# Patient Record
Sex: Female | Born: 1935 | Race: White | Hispanic: No | Smoking: Never smoker
Health system: Southern US, Community
[De-identification: ages and names within clinical notes are randomized; demographics above are authoritative.]

## PROBLEM LIST (undated history)

## (undated) DIAGNOSIS — F419 Anxiety disorder, unspecified: Secondary | ICD-10-CM

## (undated) DIAGNOSIS — B029 Zoster without complications: Secondary | ICD-10-CM

## (undated) DIAGNOSIS — D649 Anemia, unspecified: Secondary | ICD-10-CM

## (undated) DIAGNOSIS — K922 Gastrointestinal hemorrhage, unspecified: Secondary | ICD-10-CM

## (undated) DIAGNOSIS — G629 Polyneuropathy, unspecified: Secondary | ICD-10-CM

## (undated) DIAGNOSIS — Z5189 Encounter for other specified aftercare: Secondary | ICD-10-CM

## (undated) DIAGNOSIS — D509 Iron deficiency anemia, unspecified: Secondary | ICD-10-CM

## (undated) DIAGNOSIS — B0229 Other postherpetic nervous system involvement: Secondary | ICD-10-CM

## (undated) DIAGNOSIS — M5030 Other cervical disc degeneration, unspecified cervical region: Secondary | ICD-10-CM

## (undated) DIAGNOSIS — I639 Cerebral infarction, unspecified: Secondary | ICD-10-CM

## (undated) DIAGNOSIS — M1990 Unspecified osteoarthritis, unspecified site: Secondary | ICD-10-CM

## (undated) HISTORY — PX: TONSILLECTOMY: SUR1361

## (undated) HISTORY — PX: BACK SURGERY: SHX140

## (undated) HISTORY — PX: ABDOMINAL HYSTERECTOMY: SHX81

## (undated) HISTORY — PX: APPENDECTOMY: SHX54

---

## 1998-08-01 ENCOUNTER — Other Ambulatory Visit: Admission: RE | Admit: 1998-08-01 | Discharge: 1998-08-01 | Payer: Self-pay | Admitting: Obstetrics and Gynecology

## 1999-04-25 ENCOUNTER — Encounter (INDEPENDENT_AMBULATORY_CARE_PROVIDER_SITE_OTHER): Payer: Self-pay | Admitting: Specialist

## 1999-04-25 ENCOUNTER — Other Ambulatory Visit: Admission: RE | Admit: 1999-04-25 | Discharge: 1999-04-25 | Payer: Self-pay | Admitting: Gastroenterology

## 1999-08-21 ENCOUNTER — Other Ambulatory Visit: Admission: RE | Admit: 1999-08-21 | Discharge: 1999-08-21 | Payer: Self-pay | Admitting: Obstetrics and Gynecology

## 2001-02-01 ENCOUNTER — Encounter: Admission: RE | Admit: 2001-02-01 | Discharge: 2001-02-01 | Payer: Self-pay | Admitting: Pulmonary Disease

## 2001-02-01 ENCOUNTER — Encounter: Payer: Self-pay | Admitting: Pulmonary Disease

## 2001-03-03 ENCOUNTER — Other Ambulatory Visit: Admission: RE | Admit: 2001-03-03 | Discharge: 2001-03-03 | Payer: Self-pay | Admitting: Obstetrics and Gynecology

## 2001-04-27 ENCOUNTER — Encounter: Admission: RE | Admit: 2001-04-27 | Discharge: 2001-05-12 | Payer: Self-pay | Admitting: Neurological Surgery

## 2002-03-03 ENCOUNTER — Other Ambulatory Visit: Admission: RE | Admit: 2002-03-03 | Discharge: 2002-03-03 | Payer: Self-pay | Admitting: Obstetrics and Gynecology

## 2002-03-15 ENCOUNTER — Encounter: Payer: Self-pay | Admitting: Obstetrics and Gynecology

## 2002-03-15 ENCOUNTER — Encounter: Admission: RE | Admit: 2002-03-15 | Discharge: 2002-03-15 | Payer: Self-pay | Admitting: Obstetrics and Gynecology

## 2002-06-16 ENCOUNTER — Encounter: Admission: RE | Admit: 2002-06-16 | Discharge: 2002-09-14 | Payer: Self-pay | Admitting: *Deleted

## 2002-07-26 ENCOUNTER — Encounter: Admission: RE | Admit: 2002-07-26 | Discharge: 2002-07-26 | Payer: Self-pay | Admitting: Sports Medicine

## 2002-07-26 ENCOUNTER — Encounter: Payer: Self-pay | Admitting: Sports Medicine

## 2002-08-15 ENCOUNTER — Encounter: Admission: RE | Admit: 2002-08-15 | Discharge: 2002-08-15 | Payer: Self-pay | Admitting: Sports Medicine

## 2002-08-15 ENCOUNTER — Encounter: Payer: Self-pay | Admitting: Sports Medicine

## 2002-09-01 ENCOUNTER — Encounter: Admission: RE | Admit: 2002-09-01 | Discharge: 2002-09-01 | Payer: Self-pay | Admitting: Sports Medicine

## 2002-09-01 ENCOUNTER — Encounter: Payer: Self-pay | Admitting: Sports Medicine

## 2003-05-26 ENCOUNTER — Encounter: Admission: RE | Admit: 2003-05-26 | Discharge: 2003-05-26 | Payer: Self-pay | Admitting: Rheumatology

## 2003-05-26 ENCOUNTER — Encounter: Payer: Self-pay | Admitting: Rheumatology

## 2003-06-04 ENCOUNTER — Emergency Department (HOSPITAL_COMMUNITY): Admission: EM | Admit: 2003-06-04 | Discharge: 2003-06-05 | Payer: Self-pay | Admitting: Emergency Medicine

## 2003-06-04 ENCOUNTER — Encounter: Payer: Self-pay | Admitting: Emergency Medicine

## 2003-06-09 ENCOUNTER — Encounter: Admission: RE | Admit: 2003-06-09 | Discharge: 2003-06-09 | Payer: Self-pay | Admitting: Rheumatology

## 2003-06-09 ENCOUNTER — Encounter: Payer: Self-pay | Admitting: Rheumatology

## 2003-07-18 ENCOUNTER — Encounter: Admission: RE | Admit: 2003-07-18 | Discharge: 2003-09-28 | Payer: Self-pay | Admitting: Rheumatology

## 2003-09-21 ENCOUNTER — Encounter: Admission: RE | Admit: 2003-09-21 | Discharge: 2003-09-21 | Payer: Self-pay | Admitting: Sports Medicine

## 2003-10-05 ENCOUNTER — Encounter: Admission: RE | Admit: 2003-10-05 | Discharge: 2003-10-05 | Payer: Self-pay | Admitting: Sports Medicine

## 2003-10-19 ENCOUNTER — Encounter: Admission: RE | Admit: 2003-10-19 | Discharge: 2003-10-19 | Payer: Self-pay | Admitting: Sports Medicine

## 2004-05-02 ENCOUNTER — Ambulatory Visit (HOSPITAL_COMMUNITY): Admission: RE | Admit: 2004-05-02 | Discharge: 2004-05-02 | Payer: Self-pay | Admitting: Pulmonary Disease

## 2004-07-22 ENCOUNTER — Ambulatory Visit: Payer: Self-pay | Admitting: Pulmonary Disease

## 2004-08-02 ENCOUNTER — Ambulatory Visit: Payer: Self-pay | Admitting: Gastroenterology

## 2004-08-05 ENCOUNTER — Ambulatory Visit (HOSPITAL_COMMUNITY): Admission: RE | Admit: 2004-08-05 | Discharge: 2004-08-05 | Payer: Self-pay | Admitting: Gastroenterology

## 2004-08-16 ENCOUNTER — Ambulatory Visit: Payer: Self-pay | Admitting: Gastroenterology

## 2004-09-03 ENCOUNTER — Ambulatory Visit: Payer: Self-pay | Admitting: Pulmonary Disease

## 2004-09-17 ENCOUNTER — Ambulatory Visit: Payer: Self-pay | Admitting: Pulmonary Disease

## 2004-10-28 ENCOUNTER — Ambulatory Visit: Payer: Self-pay | Admitting: Pulmonary Disease

## 2004-12-02 ENCOUNTER — Ambulatory Visit: Payer: Self-pay | Admitting: Pulmonary Disease

## 2005-04-07 ENCOUNTER — Ambulatory Visit: Payer: Self-pay | Admitting: Pulmonary Disease

## 2005-07-09 ENCOUNTER — Ambulatory Visit: Payer: Self-pay | Admitting: Pulmonary Disease

## 2005-07-16 ENCOUNTER — Ambulatory Visit: Payer: Self-pay | Admitting: Pulmonary Disease

## 2005-09-17 ENCOUNTER — Ambulatory Visit: Payer: Self-pay | Admitting: Pulmonary Disease

## 2005-11-25 ENCOUNTER — Ambulatory Visit: Payer: Self-pay | Admitting: Gastroenterology

## 2005-11-28 ENCOUNTER — Ambulatory Visit: Payer: Self-pay | Admitting: Cardiovascular Disease

## 2006-02-25 ENCOUNTER — Ambulatory Visit: Payer: Self-pay | Admitting: Pulmonary Disease

## 2006-03-19 ENCOUNTER — Encounter: Admission: RE | Admit: 2006-03-19 | Discharge: 2006-03-19 | Payer: Self-pay | Admitting: Orthopaedic Surgery

## 2006-04-09 ENCOUNTER — Encounter: Admission: RE | Admit: 2006-04-09 | Discharge: 2006-04-09 | Payer: Self-pay | Admitting: Orthopaedic Surgery

## 2006-04-28 ENCOUNTER — Ambulatory Visit: Payer: Self-pay | Admitting: Pulmonary Disease

## 2006-04-29 ENCOUNTER — Ambulatory Visit: Payer: Self-pay | Admitting: Pulmonary Disease

## 2006-06-15 ENCOUNTER — Encounter: Admission: RE | Admit: 2006-06-15 | Discharge: 2006-06-15 | Payer: Self-pay | Admitting: Orthopaedic Surgery

## 2006-07-01 ENCOUNTER — Ambulatory Visit: Payer: Self-pay | Admitting: Pulmonary Disease

## 2006-07-28 ENCOUNTER — Other Ambulatory Visit: Admission: RE | Admit: 2006-07-28 | Discharge: 2006-07-28 | Payer: Self-pay | Admitting: Radiology

## 2006-10-02 ENCOUNTER — Ambulatory Visit: Payer: Self-pay | Admitting: Pulmonary Disease

## 2006-12-29 ENCOUNTER — Ambulatory Visit: Payer: Self-pay | Admitting: Pulmonary Disease

## 2007-02-01 ENCOUNTER — Ambulatory Visit: Payer: Self-pay | Admitting: Pulmonary Disease

## 2007-03-27 ENCOUNTER — Encounter: Admission: RE | Admit: 2007-03-27 | Discharge: 2007-03-27 | Payer: Self-pay | Admitting: Orthopaedic Surgery

## 2007-04-26 ENCOUNTER — Encounter: Admission: RE | Admit: 2007-04-26 | Discharge: 2007-04-26 | Payer: Self-pay | Admitting: Orthopaedic Surgery

## 2007-05-05 ENCOUNTER — Ambulatory Visit: Payer: Self-pay | Admitting: Pulmonary Disease

## 2007-05-05 LAB — CONVERTED CEMR LAB
Albumin: 3.2 g/dL — ABNORMAL LOW (ref 3.5–5.2)
Alkaline Phosphatase: 71 units/L (ref 39–117)
BUN: 14 mg/dL (ref 6–23)
Basophils Absolute: 0.1 10*3/uL (ref 0.0–0.1)
Creatinine, Ser: 0.5 mg/dL (ref 0.4–1.2)
Eosinophils Absolute: 0.2 10*3/uL (ref 0.0–0.6)
GFR calc Af Amer: 154 mL/min
GFR calc non Af Amer: 127 mL/min
Hemoglobin: 12.9 g/dL (ref 12.0–15.0)
MCHC: 35.5 g/dL (ref 30.0–36.0)
MCV: 90.6 fL (ref 78.0–100.0)
Monocytes Absolute: 0.9 10*3/uL — ABNORMAL HIGH (ref 0.2–0.7)
Monocytes Relative: 8.5 % (ref 3.0–11.0)
Neutro Abs: 8 10*3/uL — ABNORMAL HIGH (ref 1.4–7.7)
Neutrophils Relative %: 71.7 % (ref 43.0–77.0)
Potassium: 5 meq/L (ref 3.5–5.1)
RDW: 13.4 % (ref 11.5–14.6)
Sodium: 136 meq/L (ref 135–145)
TSH: 4.61 microintl units/mL (ref 0.35–5.50)

## 2007-06-04 ENCOUNTER — Encounter: Admission: RE | Admit: 2007-06-04 | Discharge: 2007-06-04 | Payer: Self-pay | Admitting: Orthopaedic Surgery

## 2007-06-23 ENCOUNTER — Ambulatory Visit: Payer: Self-pay | Admitting: Pulmonary Disease

## 2007-06-28 DIAGNOSIS — M4120 Other idiopathic scoliosis, site unspecified: Secondary | ICD-10-CM | POA: Insufficient documentation

## 2007-06-28 DIAGNOSIS — Z862 Personal history of diseases of the blood and blood-forming organs and certain disorders involving the immune mechanism: Secondary | ICD-10-CM

## 2007-06-28 DIAGNOSIS — F411 Generalized anxiety disorder: Secondary | ICD-10-CM

## 2007-06-28 DIAGNOSIS — M1990 Unspecified osteoarthritis, unspecified site: Secondary | ICD-10-CM | POA: Insufficient documentation

## 2007-06-28 DIAGNOSIS — K449 Diaphragmatic hernia without obstruction or gangrene: Secondary | ICD-10-CM | POA: Insufficient documentation

## 2007-09-01 ENCOUNTER — Ambulatory Visit: Payer: Self-pay | Admitting: Pulmonary Disease

## 2007-09-01 DIAGNOSIS — J42 Unspecified chronic bronchitis: Secondary | ICD-10-CM

## 2007-09-01 DIAGNOSIS — M542 Cervicalgia: Secondary | ICD-10-CM | POA: Insufficient documentation

## 2007-11-22 ENCOUNTER — Telehealth (INDEPENDENT_AMBULATORY_CARE_PROVIDER_SITE_OTHER): Payer: Self-pay | Admitting: *Deleted

## 2007-11-24 ENCOUNTER — Telehealth (INDEPENDENT_AMBULATORY_CARE_PROVIDER_SITE_OTHER): Payer: Self-pay | Admitting: *Deleted

## 2007-12-30 ENCOUNTER — Ambulatory Visit: Payer: Self-pay | Admitting: Internal Medicine

## 2007-12-31 LAB — CONVERTED CEMR LAB
Basophils Absolute: 0 10*3/uL (ref 0.0–0.1)
Basophils Relative: 0 % (ref 0.0–1.0)
CO2: 27 meq/L (ref 19–32)
Calcium: 9.5 mg/dL (ref 8.4–10.5)
Eosinophils Relative: 1.3 % (ref 0.0–5.0)
HCT: 43.3 % (ref 36.0–46.0)
Hemoglobin: 14.7 g/dL (ref 12.0–15.0)
MCV: 93.5 fL (ref 78.0–100.0)
Monocytes Relative: 2 % — ABNORMAL LOW (ref 3.0–12.0)
RBC: 4.63 M/uL (ref 3.87–5.11)
Sodium: 135 meq/L (ref 135–145)
TSH: 3.98 microintl units/mL (ref 0.35–5.50)
WBC: 5.9 10*3/uL (ref 4.5–10.5)

## 2008-01-04 ENCOUNTER — Telehealth: Payer: Self-pay | Admitting: Pulmonary Disease

## 2008-02-10 ENCOUNTER — Ambulatory Visit: Payer: Self-pay | Admitting: Internal Medicine

## 2008-02-23 ENCOUNTER — Ambulatory Visit: Payer: Self-pay | Admitting: Pulmonary Disease

## 2008-02-23 ENCOUNTER — Encounter: Admission: RE | Admit: 2008-02-23 | Discharge: 2008-02-23 | Payer: Self-pay | Admitting: Anesthesiology

## 2008-02-23 LAB — CONVERTED CEMR LAB
OCCULT 1: NEGATIVE
OCCULT 4: NEGATIVE

## 2008-04-12 ENCOUNTER — Ambulatory Visit: Payer: Self-pay | Admitting: Pulmonary Disease

## 2008-04-12 DIAGNOSIS — K5900 Constipation, unspecified: Secondary | ICD-10-CM | POA: Insufficient documentation

## 2008-04-12 DIAGNOSIS — D126 Benign neoplasm of colon, unspecified: Secondary | ICD-10-CM

## 2008-04-17 DIAGNOSIS — H409 Unspecified glaucoma: Secondary | ICD-10-CM | POA: Insufficient documentation

## 2008-05-01 ENCOUNTER — Telehealth (INDEPENDENT_AMBULATORY_CARE_PROVIDER_SITE_OTHER): Payer: Self-pay | Admitting: *Deleted

## 2008-05-12 ENCOUNTER — Telehealth: Payer: Self-pay | Admitting: Pulmonary Disease

## 2008-07-27 ENCOUNTER — Telehealth (INDEPENDENT_AMBULATORY_CARE_PROVIDER_SITE_OTHER): Payer: Self-pay | Admitting: *Deleted

## 2008-07-27 ENCOUNTER — Encounter: Payer: Self-pay | Admitting: Pulmonary Disease

## 2011-01-31 NOTE — Assessment & Plan Note (Signed)
[facility]                               PULMONARY OFFICE NOTE   HILA, BOLDING                     MRN:          [id number]  DATE:04/28/2006                            DOB:          [birth year]    CHIEF COMPLAINT:  Right arm pain.   HISTORY OF PRESENT ILLNESS:  Ms. Jeanne Cruz is a 77 year old white  female, a patient of Dr. Alroy Dust, who while pulling on a hose noticed a  pulled muscle and had some discomfort in her right shoulder and right neck.  She has an appointment to see Dr. Kriste Basque tomorrow but told her that she  should come in and be evaluated at this time.   PAST MEDICAL HISTORY:  Her past medical history is well documented.   MEDICATIONS:  Her medications are well documented on the front sheet.   ALLERGIES:  SHE IS ALLERGIC TO PENICILLIN AND AMPICILLIN.   PHYSICAL EXAMINATION:  GENERAL:  Frail, elderly, white female in no acute  distress at rest.  Of note, she is able to ambulate without problems.  She  articulates her right arm without difficulty and has no obvious deformity or  injury.  HEENT:  Essentially unremarkable.  The right arm has good full range of  motion without pain or discomfort.  She is able to articulate her neck  without pain or discomfort.  Of note, she carries her pocketbook on her  right shoulder which is her affected arm.   IMPRESSION:  Pulled muscle.   PLAN:  She has been instructed to use Tramadol which she already has for  pain and that she can follow up with Dr. Kriste Basque tomorrow.  If she has further  problems, then x-rays and more aggressive evaluation can be done tomorrow.                                   Devra Dopp, MSN, ACNP   SM/MedQ  DD:  04/28/2006  DT:  04/29/2006  Job #:  [id number]

## 2011-01-31 NOTE — Assessment & Plan Note (Signed)
 [facility]                             PULMONARY OFFICE NOTE   Jeanne Cruz, Jeanne Cruz                     MRN:          [id number]  DATE:12/29/2006                            DOB:          Jun 09, 1928    HISTORY OF PRESENT ILLNESS:  The patient is a 77 year old white female  patient of Dr. Jodelle Green who has a known history of asthmatic bronchitis,  hypertension and osteoarthritis who presents with complaint of lower  extremity swelling.  The patient complains over the last few months she  has noticed she has been having increased lower extremity edema that is  worse in the afternoon hours.  She also has noticed that blood pressure  has been elevated with systolic pressures ranging 130-150.  The patient  denies any fever, chest pain, shortness of breath, palpitations, calf  pain, recent travel. The patient does use Celebrex every day for joint  pain but denies any new medications.   PAST MEDICAL HISTORY:  Is reviewed.  CURRENT MEDICATIONS:  Reviewed.   PHYSICAL EXAMINATION:  GENERAL: The patient is a pleasant female in no  acute distress.  VITAL SIGNS:  She is afebrile with stable vital signs.  HEENT:  Unremarkable.  NECK: Supple without cervical adenopathy. No JVD.  CHEST:  Lung sounds reveal diminished breath sounds at the bases,  otherwise clear.  CARDIAC:  Regular rate and rhythm, no murmurs, rubs, or gallops.  ABDOMEN:  Soft, nontender, no palpable hepatosplenomegaly.  EXTREMITIES:  Are warm without any calf tenderness, clubbing. There is  trace to 1+ edema bilaterally. Negative Homan's sign. Pulses are intact.  The patient has arthritic changes bilaterally of the hands.   IMPRESSION AND PLAN:  1. Venous insufficiency with edema. The patient is to begin Lasix 20      mg daily over the next 3 days and then on a p.r.n. basis. Patient      is to keep lower extremities elevated. The patient will be unable      to use support stockings due to  severe arthritis. Patient is to use      a low sodium diet.  Patient will follow back up in 2-3 months with      Dr. Kriste Cruz or sooner if needed.  2. The patient does complain of persistent insomnia. She has been      using Xanax, however without much relief, only averaging about 3-5      hours of sleep. The patient was advised on a      proper sleep hygiene regimen.  She may use Ambien 10 mg at h.s.      p.r.n.  The patient is to decrease stimulating activities such as      TV before bed.      Rubye Oaks, NP  Electronically Signed      Jeanne Cloud. Kriste Basque, MD  Electronically Signed   TP/MedQ  DD: 12/29/2006  DT: 12/29/2006  Job #: [id number]

## 2017-04-16 ENCOUNTER — Emergency Department (HOSPITAL_BASED_OUTPATIENT_CLINIC_OR_DEPARTMENT_OTHER): Payer: Medicare Other

## 2017-04-16 ENCOUNTER — Encounter (HOSPITAL_BASED_OUTPATIENT_CLINIC_OR_DEPARTMENT_OTHER): Payer: Self-pay | Admitting: *Deleted

## 2017-04-16 ENCOUNTER — Observation Stay (HOSPITAL_BASED_OUTPATIENT_CLINIC_OR_DEPARTMENT_OTHER)
Admission: EM | Admit: 2017-04-16 | Discharge: 2017-04-17 | Disposition: A | Discharge status: Home / Self Care | Payer: Medicare Other | Attending: Internal Medicine | Admitting: Internal Medicine

## 2017-04-16 DIAGNOSIS — K439 Ventral hernia without obstruction or gangrene: Secondary | ICD-10-CM | POA: Diagnosis not present

## 2017-04-16 DIAGNOSIS — K449 Diaphragmatic hernia without obstruction or gangrene: Secondary | ICD-10-CM | POA: Diagnosis not present

## 2017-04-16 DIAGNOSIS — F419 Anxiety disorder, unspecified: Secondary | ICD-10-CM | POA: Insufficient documentation

## 2017-04-16 DIAGNOSIS — Z86010 Personal history of colonic polyps: Secondary | ICD-10-CM | POA: Diagnosis not present

## 2017-04-16 DIAGNOSIS — B0229 Other postherpetic nervous system involvement: Secondary | ICD-10-CM | POA: Diagnosis not present

## 2017-04-16 DIAGNOSIS — Z880 Allergy status to penicillin: Secondary | ICD-10-CM | POA: Insufficient documentation

## 2017-04-16 DIAGNOSIS — F411 Generalized anxiety disorder: Secondary | ICD-10-CM | POA: Diagnosis present

## 2017-04-16 DIAGNOSIS — K922 Gastrointestinal hemorrhage, unspecified: Secondary | ICD-10-CM | POA: Diagnosis present

## 2017-04-16 DIAGNOSIS — Z79899 Other long term (current) drug therapy: Secondary | ICD-10-CM | POA: Insufficient documentation

## 2017-04-16 DIAGNOSIS — D509 Iron deficiency anemia, unspecified: Secondary | ICD-10-CM | POA: Diagnosis present

## 2017-04-16 DIAGNOSIS — R531 Weakness: Secondary | ICD-10-CM | POA: Insufficient documentation

## 2017-04-16 DIAGNOSIS — K3189 Other diseases of stomach and duodenum: Secondary | ICD-10-CM | POA: Diagnosis not present

## 2017-04-16 DIAGNOSIS — D62 Acute posthemorrhagic anemia: Secondary | ICD-10-CM | POA: Insufficient documentation

## 2017-04-16 DIAGNOSIS — K921 Melena: Secondary | ICD-10-CM | POA: Insufficient documentation

## 2017-04-16 DIAGNOSIS — E079 Disorder of thyroid, unspecified: Secondary | ICD-10-CM | POA: Diagnosis not present

## 2017-04-16 HISTORY — DX: Unspecified osteoarthritis, unspecified site: M19.90

## 2017-04-16 HISTORY — DX: Anxiety disorder, unspecified: F41.9

## 2017-04-16 HISTORY — DX: Polyneuropathy, unspecified: G62.9

## 2017-04-16 HISTORY — DX: Gastrointestinal hemorrhage, unspecified: K92.2

## 2017-04-16 HISTORY — DX: Other cervical disc degeneration, unspecified cervical region: M50.30

## 2017-04-16 HISTORY — DX: Iron deficiency anemia, unspecified: D50.9

## 2017-04-16 HISTORY — DX: Zoster without complications: B02.9

## 2017-04-16 HISTORY — DX: Other postherpetic nervous system involvement: B02.29

## 2017-04-16 HISTORY — DX: Anemia, unspecified: D64.9

## 2017-04-16 LAB — CBC WITH DIFFERENTIAL/PLATELET
Basophils Absolute: 0 10*3/uL (ref 0.0–0.1)
Basophils Relative: 1 %
Eosinophils Absolute: 0.1 10*3/uL (ref 0.0–0.7)
Eosinophils Relative: 2 %
HCT: 28.2 % — ABNORMAL LOW (ref 36.0–46.0)
Hemoglobin: 8.9 g/dL — ABNORMAL LOW (ref 12.0–15.0)
Lymphocytes Relative: 25 %
Lymphs Abs: 1.1 10*3/uL (ref 0.7–4.0)
MCH: 26.5 pg (ref 26.0–34.0)
MCHC: 31.6 g/dL (ref 30.0–36.0)
MCV: 83.9 fL (ref 78.0–100.0)
Monocytes Absolute: 0.5 10*3/uL (ref 0.1–1.0)
Monocytes Relative: 11 %
Neutro Abs: 2.7 10*3/uL (ref 1.7–7.7)
Neutrophils Relative %: 61 %
Platelets: 240 10*3/uL (ref 150–400)
RBC: 3.36 MIL/uL — ABNORMAL LOW (ref 3.87–5.11)
RDW: 14.9 % (ref 11.5–15.5)
WBC: 4.4 10*3/uL (ref 4.0–10.5)

## 2017-04-16 LAB — URINALYSIS, ROUTINE W REFLEX MICROSCOPIC
Bilirubin Urine: NEGATIVE
Glucose, UA: NEGATIVE mg/dL
Hgb urine dipstick: NEGATIVE
Ketones, ur: NEGATIVE mg/dL
Leukocytes, UA: NEGATIVE
Nitrite: NEGATIVE
Protein, ur: NEGATIVE mg/dL
Specific Gravity, Urine: 1.009 (ref 1.005–1.030)
pH: 7.5 (ref 5.0–8.0)

## 2017-04-16 LAB — BASIC METABOLIC PANEL
Anion gap: 6 (ref 5–15)
BUN: 15 mg/dL (ref 6–20)
CO2: 31 mmol/L (ref 22–32)
Calcium: 8.9 mg/dL (ref 8.9–10.3)
Chloride: 94 mmol/L — ABNORMAL LOW (ref 101–111)
Creatinine, Ser: 0.47 mg/dL (ref 0.44–1.00)
GFR calc Af Amer: >60 mL/min (ref >60)
GFR calc non Af Amer: >60 mL/min (ref >60)
Glucose, Bld: 95 mg/dL (ref 65–99)
Potassium: 4.6 mmol/L (ref 3.5–5.1)
Sodium: 131 mmol/L — ABNORMAL LOW (ref 135–145)

## 2017-04-16 LAB — BRAIN NATRIURETIC PEPTIDE: B Natriuretic Peptide: 59.1 pg/mL (ref 0.0–100.0)

## 2017-04-16 LAB — OCCULT BLOOD X 1 CARD TO LAB, STOOL: Fecal Occult Bld: POSITIVE — AB

## 2017-04-16 MED ORDER — GABAPENTIN 100 MG PO CAPS
100.0000 mg | ORAL_CAPSULE | Freq: Once | ORAL | Status: AC
  Administered 2017-04-16: 100 mg via ORAL
  Filled 2017-04-16: qty 1

## 2017-04-16 MED ORDER — FAMOTIDINE IN NACL 20-0.9 MG/50ML-% IV SOLN
20.0000 mg | Freq: Once | INTRAVENOUS | Status: AC
  Administered 2017-04-16: 20 mg via INTRAVENOUS
  Filled 2017-04-16: qty 50

## 2017-04-16 MED ORDER — PANTOPRAZOLE SODIUM 40 MG IV SOLR
40.0000 mg | Freq: Once | INTRAVENOUS | Status: AC
  Administered 2017-04-16: 40 mg via INTRAVENOUS
  Filled 2017-04-16: qty 40

## 2017-04-16 MED ORDER — TRAMADOL HCL 50 MG PO TABS
50.0000 mg | ORAL_TABLET | Freq: Once | ORAL | Status: AC
  Administered 2017-04-16: 50 mg via ORAL
  Filled 2017-04-16: qty 1

## 2017-04-16 NOTE — ED Provider Notes (Signed)
[facility] Provider Note   CSN: [id number] Arrival date & time: 04/16/17  1456     History   Chief Complaint Chief Complaint  Patient presents with  . Dizziness    HPI Jeanne Cruz is a 83 y.o. female.  HPI   83 year old female with generalized weakness. Progressively worsening over the past 2 weeks. Patient has a past history of iron deficiency anemia and GI bleed. She reports previous hospitalizations required transfusion of packed red blood cells and iron infusions. She was getting iron infusions every 3 months, but last was in March and she has subsequently relocated to this area from [place]. She describes dyspnea is worse with activity. Since this morning she has also been having burning epigastric pain. No cough. No unusual swelling. No bright blood blood per rectum  Past Medical History:  Diagnosis Date  . Anemia   . Anxiety   . Arthritis   . Degenerative disc disease, cervical   . Iron deficiency anemia   . Neuropathy   . Postherpetic neuralgia   . Shingles   . Thyroid disease     Patient Active Problem List   Diagnosis Date Noted  . GLAUCOMA 04/17/2008  . COLONIC POLYPS 04/12/2008  . CONSTIPATION 04/12/2008  . BRONCHITIS, RECURRENT 09/01/2007  . NECK AND BACK PAIN 09/01/2007  . ANXIETY 06/28/2007  . HIATAL HERNIA 06/28/2007  . DEGENERATIVE JOINT DISEASE 06/28/2007  . SCOLIOSIS 06/28/2007  . ANEMIA, HX OF 06/28/2007    Past Surgical History:  Procedure Laterality Date  . ABDOMINAL HYSTERECTOMY    . APPENDECTOMY    . BACK SURGERY    . TONSILLECTOMY      OB History    No data available       Home Medications    Prior to Admission medications   Medication Sig Start Date End Date Taking? Authorizing Provider  albuterol (PROVENTIL HFA;VENTOLIN HFA) 108 (90 Base) MCG/ACT inhaler Inhale into the lungs every 6 (six) hours as needed for wheezing or shortness of breath.   Yes [provider]  ALPRAZolam (XANAX) 0.25 MG  tablet Take 0.25 mg by mouth 3 (three) times daily as needed for anxiety.   Yes [provider]  dorzolamide-timolol (COSOPT) 22.3-6.8 MG/ML ophthalmic solution 1 drop 2 (two) times daily.   Yes [provider]  furosemide (LASIX) 20 MG tablet Take 20 mg by mouth.   Yes [provider]  gabapentin (NEURONTIN) 100 MG capsule Take 100 mg by mouth 3 (three) times daily.   Yes [provider]  Polyethylene Glycol 3350 (MIRALAX PO) Take by mouth.   Yes [provider]  traMADol (ULTRAM) 50 MG tablet Take by mouth every 6 (six) hours as needed.   Yes [provider]    Family History No family history on file.  Social History Social History  Substance Use Topics  . Smoking status: Not on file  . Smokeless tobacco: Not on file  . Alcohol use Not on file     Allergies   Ampicillin and Penicillins   Review of Systems Review of Systems  All systems reviewed and negative, other than as noted in HPI.  Physical Exam Updated Vital Signs BP (!) 152/81   Pulse 80   Temp 98.5 F (36.9 C)   Resp 16   Ht 4\' 11"  (1.499 m)   Wt 41.3 kg (91 lb)   SpO2 97%   BMI 18.38 kg/m   Physical Exam  Constitutional: She appears well-developed and well-nourished.  No distress.  HENT:  Head: Normocephalic and atraumatic.  Eyes: Conjunctivae are normal. Right eye exhibits no discharge. Left eye exhibits no discharge.  Neck: Neck supple.  Cardiovascular: Normal rate, regular rhythm and normal heart sounds.  Exam reveals no gallop and no friction rub.   No murmur heard. Pulmonary/Chest: Effort normal and breath sounds normal. No respiratory distress.  Abdominal: Soft. She exhibits no distension. There is no tenderness.  Musculoskeletal: She exhibits no edema or tenderness.  Neurological: She is alert.  Skin: Skin is warm and dry.  Psychiatric: She has a normal mood and affect. Her behavior is normal. Thought content normal.  Nursing note and  vitals reviewed.    ED Treatments / Results  Labs (all labs ordered are listed, but only abnormal results are displayed) Labs Reviewed  CBC WITH DIFFERENTIAL/PLATELET - Abnormal; Notable for the following:       Result Value   RBC 3.36 (*)    Hemoglobin 8.9 (*)    HCT 28.2 (*)    All other components within normal limits  BASIC METABOLIC PANEL - Abnormal; Notable for the following:    Sodium 131 (*)    Chloride 94 (*)    All other components within normal limits  OCCULT BLOOD X 1 CARD TO LAB, STOOL - Abnormal; Notable for the following:    Fecal Occult Bld POSITIVE (*)    All other components within normal limits  CBC - Abnormal; Notable for the following:    RBC 3.14 (*)    Hemoglobin 8.3 (*)    HCT 26.1 (*)    All other components within normal limits  IRON AND TIBC - Abnormal; Notable for the following:    Iron 14 (*)    Saturation Ratios 3 (*)    All other components within normal limits  FERRITIN - Abnormal; Notable for the following:    Ferritin 6 (*)    All other components within normal limits  RETICULOCYTES - Abnormal; Notable for the following:    RBC. 3.14 (*)    All other components within normal limits  CBC - Abnormal; Notable for the following:    WBC 3.5 (*)    RBC 3.44 (*)    Hemoglobin 9.0 (*)    HCT 28.8 (*)    All other components within normal limits  BRAIN NATRIURETIC PEPTIDE  URINALYSIS, ROUTINE W REFLEX MICROSCOPIC  PROTIME-INR  TYPE AND SCREEN  ABO/RH    EKG  EKG Interpretation None       Radiology No results found.  Procedures Procedures (including critical care time)  Medications Ordered in ED Medications  famotidine (PEPCID) IVPB 20 mg premix (not administered)     Initial Impression / Assessment and Plan / ED Course  I have reviewed the triage vital signs and the nursing notes.  Pertinent labs & imaging results that were available during my care of the patient were reviewed by me and considered in my medical decision  making (see chart for details).     89yF with generalized weakness and dyspnea on exertion. Anemic, but not clear what baseline is. Admit.   Final Clinical Impressions(s) / ED Diagnoses   Final diagnoses:  GI bleed  Anemia  New Prescriptions New Prescriptions   No medications on file     Virgel Manifold, MD 05/03/17 1219

## 2017-04-16 NOTE — ED Triage Notes (Signed)
Pt c/o dizziness, SOB and adb " burning" x 2 weeks, HX anemia

## 2017-04-16 NOTE — ED Notes (Addendum)
ED Provider at bedside. Hemocult collected.

## 2017-04-16 NOTE — ED Notes (Signed)
Pt requested to be given her home meds of Tramadol 50mg  po and Gabapentin 100mg  po.  She did not bring them with her. MD notified.  Orders received.

## 2017-04-16 NOTE — Progress Notes (Signed)
83 yo F with hx of IDA and GI bleeding (unclear type) who presents with few weeks progressive fatigue/weakness.  Reports that she recently moved from [place] and hasn't established care here yet.  Was getting iron transfusions and blood transfusions there fairly regularly.  Now comes in describing "dark stools" and progressive dyspnea with exertion over weeks.    BP (!) 158/88 (BP Location: Left Arm)   Pulse 95   Temp 98.5 F (36.9 C)   Resp 20   Ht 4\' 11"  (1.499 m)   Wt 41.3 kg (91 lb)   SpO2 98%   BMI 18.38 kg/m   Na 131, K 4.6, Cr 0.47, BUN 15 only, WBC 4.4, Hgb 8.9 (unknown baseline) Rectal exam with brown stool, FOBT+ Given Pepcid IV and TRH asked to evaluate overnight for GI consultation tomorrow  To med surg, OBS status.

## 2017-04-17 ENCOUNTER — Observation Stay (HOSPITAL_COMMUNITY): Payer: Medicare Other | Admitting: Anesthesiology

## 2017-04-17 ENCOUNTER — Encounter (HOSPITAL_COMMUNITY): Payer: Self-pay | Admitting: Family Medicine

## 2017-04-17 ENCOUNTER — Encounter (HOSPITAL_COMMUNITY)
Admission: EM | Disposition: A | Discharge status: Home / Self Care | Payer: Self-pay | Source: Home / Self Care | Attending: Emergency Medicine

## 2017-04-17 DIAGNOSIS — D62 Acute posthemorrhagic anemia: Secondary | ICD-10-CM | POA: Diagnosis not present

## 2017-04-17 DIAGNOSIS — K3189 Other diseases of stomach and duodenum: Secondary | ICD-10-CM | POA: Diagnosis not present

## 2017-04-17 DIAGNOSIS — F411 Generalized anxiety disorder: Secondary | ICD-10-CM | POA: Diagnosis not present

## 2017-04-17 DIAGNOSIS — R531 Weakness: Secondary | ICD-10-CM | POA: Diagnosis not present

## 2017-04-17 DIAGNOSIS — K921 Melena: Secondary | ICD-10-CM | POA: Diagnosis not present

## 2017-04-17 DIAGNOSIS — D509 Iron deficiency anemia, unspecified: Secondary | ICD-10-CM | POA: Diagnosis not present

## 2017-04-17 DIAGNOSIS — K922 Gastrointestinal hemorrhage, unspecified: Secondary | ICD-10-CM | POA: Diagnosis not present

## 2017-04-17 HISTORY — PX: ESOPHAGOGASTRODUODENOSCOPY: SHX5428

## 2017-04-17 LAB — CBC
HCT: 26.1 % — ABNORMAL LOW (ref 36.0–46.0)
HCT: 28.8 % — ABNORMAL LOW (ref 36.0–46.0)
Hemoglobin: 8.3 g/dL — ABNORMAL LOW (ref 12.0–15.0)
Hemoglobin: 9 g/dL — ABNORMAL LOW (ref 12.0–15.0)
MCH: 26.4 pg (ref 26.0–34.0)
MCH: 26.2 pg (ref 26.0–34.0)
MCHC: 31.8 g/dL (ref 30.0–36.0)
MCHC: 31.3 g/dL (ref 30.0–36.0)
MCV: 83.1 fL (ref 78.0–100.0)
MCV: 83.7 fL (ref 78.0–100.0)
PLATELETS: 218 10*3/uL (ref 150–400)
PLATELETS: 216 10*3/uL (ref 150–400)
RBC: 3.14 MIL/uL — ABNORMAL LOW (ref 3.87–5.11)
RBC: 3.44 MIL/uL — AB (ref 3.87–5.11)
RDW: 15.2 % (ref 11.5–15.5)
RDW: 15.1 % (ref 11.5–15.5)
WBC: 4.1 10*3/uL (ref 4.0–10.5)
WBC: 3.5 10*3/uL — AB (ref 4.0–10.5)

## 2017-04-17 LAB — IRON AND TIBC
Iron: 14 ug/dL — ABNORMAL LOW (ref 28–170)
SATURATION RATIOS: 3 % — AB (ref 10.4–31.8)
TIBC: 448 ug/dL (ref 250–450)
UIBC: 434 ug/dL

## 2017-04-17 LAB — RETICULOCYTES
RBC.: 3.14 MIL/uL — AB (ref 3.87–5.11)
RETIC COUNT ABSOLUTE: 62.8 10*3/uL (ref 19.0–186.0)
Retic Ct Pct: 2 % (ref 0.4–3.1)

## 2017-04-17 LAB — FERRITIN: Ferritin: 6 ng/mL — ABNORMAL LOW (ref 11–307)

## 2017-04-17 LAB — TYPE AND SCREEN
ABO/RH(D): A POS
Antibody Screen: NEGATIVE

## 2017-04-17 LAB — PROTIME-INR
INR: 0.99
PROTHROMBIN TIME: 13.1 s (ref 11.4–15.2)

## 2017-04-17 SURGERY — EGD (ESOPHAGOGASTRODUODENOSCOPY)
Anesthesia: Monitor Anesthesia Care

## 2017-04-17 MED ORDER — SODIUM CHLORIDE 0.9 % IV SOLN
INTRAVENOUS | Status: DC
  Administered 2017-04-17: 75 mL/h via INTRAVENOUS

## 2017-04-17 MED ORDER — GABAPENTIN 100 MG PO CAPS: 200.0000 mg | ORAL_CAPSULE | Freq: Every day | ORAL | Status: DC

## 2017-04-17 MED ORDER — LACTATED RINGERS IV SOLN
INTRAVENOUS | Status: DC
  Administered 2017-04-17: 13:00:00 via INTRAVENOUS

## 2017-04-17 MED ORDER — TRAMADOL HCL 50 MG PO TABS
50.0000 mg | ORAL_TABLET | Freq: Four times a day (QID) | ORAL | Status: DC | PRN
  Administered 2017-04-17: 50 mg via ORAL
  Filled 2017-04-17: qty 1

## 2017-04-17 MED ORDER — ALPRAZOLAM 0.5 MG PO TABS
0.5000 mg | ORAL_TABLET | Freq: Two times a day (BID) | ORAL | Status: DC
  Administered 2017-04-17 (×2): 0.5 mg via ORAL
  Filled 2017-04-17 (×2): qty 1

## 2017-04-17 MED ORDER — ONDANSETRON HCL 4 MG PO TABS: 4.0000 mg | ORAL_TABLET | Freq: Four times a day (QID) | ORAL | Status: DC | PRN

## 2017-04-17 MED ORDER — SODIUM CHLORIDE 0.9 % IV SOLN
510.0000 mg | Freq: Once | INTRAVENOUS | Status: AC
  Administered 2017-04-17: 510 mg via INTRAVENOUS
  Filled 2017-04-17: qty 17

## 2017-04-17 MED ORDER — PANTOPRAZOLE SODIUM 40 MG PO TBEC: 40.0000 mg | DELAYED_RELEASE_TABLET | Freq: Two times a day (BID) | ORAL | 0 refills | Status: AC

## 2017-04-17 MED ORDER — PROPOFOL 10 MG/ML IV BOLUS
INTRAVENOUS | Status: AC
  Filled 2017-04-17: qty 20

## 2017-04-17 MED ORDER — ACETAMINOPHEN 650 MG RE SUPP: 650.0000 mg | Freq: Four times a day (QID) | RECTAL | Status: DC | PRN

## 2017-04-17 MED ORDER — ONDANSETRON HCL 4 MG/2ML IJ SOLN: 4.0000 mg | Freq: Four times a day (QID) | INTRAMUSCULAR | Status: DC | PRN

## 2017-04-17 MED ORDER — NYSTATIN 100000 UNIT/GM EX POWD
Freq: Three times a day (TID) | CUTANEOUS | Status: DC
  Administered 2017-04-17 (×2): 1 g via TOPICAL
  Filled 2017-04-17: qty 15

## 2017-04-17 MED ORDER — POLYETHYLENE GLYCOL 3350 17 G PO PACK
17.0000 g | PACK | Freq: Two times a day (BID) | ORAL | Status: DC
  Filled 2017-04-17: qty 1

## 2017-04-17 MED ORDER — DORZOLAMIDE HCL-TIMOLOL MAL 2-0.5 % OP SOLN
1.0000 [drp] | Freq: Two times a day (BID) | OPHTHALMIC | Status: DC
  Administered 2017-04-17: 1 [drp] via OPHTHALMIC
  Filled 2017-04-17: qty 10

## 2017-04-17 MED ORDER — GABAPENTIN 100 MG PO CAPS
100.0000 mg | ORAL_CAPSULE | Freq: Two times a day (BID) | ORAL | Status: DC
  Administered 2017-04-17: 100 mg via ORAL
  Filled 2017-04-17: qty 1

## 2017-04-17 MED ORDER — PANTOPRAZOLE SODIUM 40 MG PO TBEC: 40.0000 mg | DELAYED_RELEASE_TABLET | Freq: Two times a day (BID) | ORAL | Status: DC

## 2017-04-17 MED ORDER — SODIUM CHLORIDE 0.9 % IV SOLN: INTRAVENOUS | Status: DC

## 2017-04-17 MED ORDER — PROPOFOL 10 MG/ML IV BOLUS
INTRAVENOUS | Status: DC | PRN
  Administered 2017-04-17 (×5): 10 mg via INTRAVENOUS
  Administered 2017-04-17: 30 mg via INTRAVENOUS

## 2017-04-17 MED ORDER — PANTOPRAZOLE SODIUM 40 MG IV SOLR
40.0000 mg | Freq: Two times a day (BID) | INTRAVENOUS | Status: DC
  Administered 2017-04-17 (×2): 40 mg via INTRAVENOUS
  Filled 2017-04-17 (×2): qty 40

## 2017-04-17 MED ORDER — ACETAMINOPHEN 325 MG PO TABS: 650.0000 mg | ORAL_TABLET | Freq: Four times a day (QID) | ORAL | Status: DC | PRN

## 2017-04-17 NOTE — Progress Notes (Signed)
  PROGRESS NOTE  Patient admitted earlier this morning. See H&P. Patient with remote hx of GI bleeding around 2016 while living in [place]. She has recently relocated to [facility] and re-establishing care here. She states that at that time, EGD found a bleeding vein with normal colonoscopy. She had been doing well since. Then about 3 days ago, she noticed dark stools and has been very weak and tired. She denies any anticoagulant or ibuprofen use. She takes baby aspirin daily. FOBT was positive.   Consult to GI placed PPI IV Trend CBC. Hgb currently stable at 8.3, unknown baseline level  NPO for now Feraheme for iron deficiency  Obtain old records from [facility], DO [facility] [email or web address] Password [email or web address] 04/17/2017, 9:35 AM

## 2017-04-17 NOTE — Care Management Obs Status (Signed)
[facility] NOTIFICATION   Patient Details  Name: ALIX LAHMANN MRN: [id number] Date of Birth: Dec 12, 1927   Medicare Observation Status Notification Given:  Yes    Guadalupe Maple, RN 04/17/2017, 10:05 AM

## 2017-04-17 NOTE — Discharge Summary (Signed)
Physician Discharge Summary  Jeanne Cruz TSV:[id number] DOB: 02/16/1928 DOA: 04/16/2017  PCP: [facility]  Admit date: 04/16/2017 Discharge date: 04/17/2017  Admitted From: Home, [facility] Disposition:  Home, [facility]  Recommendations for Outpatient Follow-up:  1. Follow up with PCP in 1 week 2. Follow up with Dr. Benson Norway, GI, in 2 weeks 3. Stop all NSAIDS, aspirin  4. Please obtain CBC in 1 week  5. Would recommend prn iron IV transfusions. She refuses oral iron supplementation stating that she has had terrible constipation from it in the past; had IV iron in [place] for this reason   Discharge Condition: Stable CODE STATUS: Full  Diet recommendation: Heart healthy   Brief/Interim Summary: HPI by Dr. Loleta Books: Jeanne Cruz is a 83 y.o. female with a past medical history significant for ventral hernia, remote GI bleed who presents with fatigue for a few weeks, now worsening and 1 week melena.  The patient moved back to [place] from [place] 6 months ago, and is in the process of reestablishing medical care here. While in [place], she did have a couple episodes where she had to be admitted to the hospital for fatigue, got a blood transfusion and EGD and was told that she had "a vein bleeding in my stomach".  Last episode was ~2016 she thinks.  Now in the last few weeks she has had progressive weakness/fatigue, similar to previous GIBs.  In the last few days her stools have been dark, and her fatigue has gotten severe, so today she came to the ER.  No bright red blood per rectum.  No epigastric pain.  No dizziness, syncope.  Does not use NSAIDs or anticoagulants, takes only baby aspirin.  ED course: -Afebrile, heart rate 95, respirations pulse ox normal, blood pressure 150/88 -Na 131, K 4.6, Cr 0.47, BUN 15 only, WBC 4.4, Hgb 8.9 (unknown baseline) -Rectal exam with brown stool, FOBT+ -Given Pepcid IV and TRH asked to evaluate overnight for GI consultation  tomorrow  Interim: Patient was evaluated by GI and underwent EGD on 8/3 which found non-bleeding erosive gastropathy. Her hgb remained stable. Iron studies showed iron deficiency anemia and was transfused feraheme while in hospital.   Discharge Diagnoses:  Principal Problem:   GI bleed Active Problems:   Anxiety state   Acute blood loss anemia   Melena secondary to erosive gastropathy -Stop aspirin and all NSAIDS -EGD 8/3 -Follow up with GI as outpatient in 2 weeks  -Continue PPI PO BID   Iron deficiency anemia  -IV feraheme given. Recommend IV iron transfusions as outpatient as she has not tolerated PO in the past    Discharge Instructions  Discharge Instructions    Call MD for:  difficulty breathing, headache or visual disturbances    Complete by:  As directed    Call MD for:  extreme fatigue    Complete by:  As directed    Call MD for:  hives    Complete by:  As directed    Call MD for:  persistant dizziness or light-headedness    Complete by:  As directed    Call MD for:  persistant nausea and vomiting    Complete by:  As directed    Call MD for:  severe uncontrolled pain    Complete by:  As directed    Call MD for:  temperature >100.4    Complete by:  As directed    Diet - low sodium heart healthy    Complete by:  As directed    Discharge instructions    Complete by:  As directed    You were cared for by a hospitalist during your hospital stay. If you have any questions about your discharge medications or the care you received while you were in the hospital after you are discharged, you can call the unit and asked to speak with the hospitalist on call if the hospitalist that took care of you is not available. Once you are discharged, your primary care physician will handle any further medical issues. Please note that NO REFILLS for any discharge medications will be authorized once you are discharged, as it is imperative that you return to your primary care physician  (or establish a relationship with a primary care physician if you do not have one) for your aftercare needs so that they can reassess your need for medications and monitor your lab values.   Increase activity slowly    Complete by:  As directed      Allergies as of 04/17/2017      Reactions   Ampicillin Diarrhea, Other (See Comments)   Has patient had a PCN reaction causing immediate rash, facial/tongue/throat swelling, SOB or lightheadedness with hypotension: No Has patient had a PCN reaction causing severe rash involving mucus membranes or skin necrosis: No Has patient had a PCN reaction that required hospitalization: No Has patient had a PCN reaction occurring within the last 10 years: No If all of the above answers are "NO", then may proceed with Cephalosporin use.   Codeine Other (See Comments)   Reaction:  Somnolence    Penicillins Diarrhea, Other (See Comments)   Has patient had a PCN reaction causing immediate rash, facial/tongue/throat swelling, SOB or lightheadedness with hypotension: No Has patient had a PCN reaction causing severe rash involving mucus membranes or skin necrosis: No Has patient had a PCN reaction that required hospitalization: No Has patient had a PCN reaction occurring within the last 10 years: No If all of the above answers are "NO", then may proceed with Cephalosporin use.      Medication List    TAKE these medications   albuterol 108 (90 Base) MCG/ACT inhaler Commonly known as:  PROVENTIL HFA;VENTOLIN HFA Inhale 1-2 puffs into the lungs every 6 (six) hours as needed for wheezing or shortness of breath.   ALPRAZolam 0.5 MG tablet Commonly known as:  XANAX Take 0.5 mg by mouth 2 (two) times daily.   dorzolamide-timolol 22.3-6.8 MG/ML ophthalmic solution Commonly known as:  COSOPT Place 1 drop into both eyes 2 (two) times daily.   furosemide 20 MG tablet Commonly known as:  LASIX Take 20 mg by mouth daily as needed for edema.   multivitamin with  minerals Tabs tablet Take 1 tablet by mouth daily.   NEURONTIN 100 MG capsule Generic drug:  gabapentin Take 100-200 mg by mouth 3 (three) times daily.   nystatin powder Generic drug:  nystatin Apply 2 g topically 2 (two) times daily as needed (for irritation).   pantoprazole 40 MG tablet Commonly known as:  PROTONIX Take 1 tablet (40 mg total) by mouth 2 (two) times daily.   polyethylene glycol packet Commonly known as:  MIRALAX / GLYCOLAX Take 17 g by mouth 2 (two) times daily.   traMADol 50 MG tablet Commonly known as:  ULTRAM Take 50 mg by mouth every 6 (six) hours as needed for moderate pain.   Vitamin D (Ergocalciferol) 50000 units Caps capsule Commonly known as:  DRISDOL Take 50,000 Units  by mouth every 14 (fourteen) days.      Follow-up [facility]. Schedule an appointment as soon as possible for a visit in 1 week(s).   Contact information: [place] [address] [phone]        Carol Ada, MD. Schedule an appointment as soon as possible for a visit in 2 week(s).   Specialty:  Gastroenterology Contact information: [place], [place] [address] [phone]          Allergies  Allergen Reactions  . Ampicillin Diarrhea and Other (See Comments)    Has patient had a PCN reaction causing immediate rash, facial/tongue/throat swelling, SOB or lightheadedness with hypotension: No Has patient had a PCN reaction causing severe rash involving mucus membranes or skin necrosis: No Has patient had a PCN reaction that required hospitalization: No Has patient had a PCN reaction occurring within the last 10 years: No If all of the above answers are "NO", then may proceed with Cephalosporin use.  . Codeine Other (See Comments)    Reaction:  Somnolence   . Penicillins Diarrhea and Other (See Comments)    Has patient had a PCN reaction causing immediate rash, facial/tongue/throat swelling, SOB or  lightheadedness with hypotension: No Has patient had a PCN reaction causing severe rash involving mucus membranes or skin necrosis: No Has patient had a PCN reaction that required hospitalization: No Has patient had a PCN reaction occurring within the last 10 years: No If all of the above answers are "NO", then may proceed with Cephalosporin use.    Consultations:  GI   Procedures/Studies: Dg Chest 2 View  Result Date: 04/16/2017 CLINICAL DATA:  Dizziness and shortness of breath as well as rectal bleeding. History of colonic polyps, anemia, recurrent episodes of bronchitis. EXAM: CHEST  2 VIEW COMPARISON:  Report of a chest x-ray dated June 04, 2003. FINDINGS: The lungs are adequately inflated and clear. The heart is normal in size. The pulmonary vascularity is not engorged. There is tortuosity of the ascending and descending thoracic aorta. There is no pleural effusion. There is moderate dextrocurvature of the spine centered in the lower thoracic region. There is a moderate-sized hiatal hernia. IMPRESSION: There is no pneumonia, CHF, nor other acute cardiopulmonary disease. Moderate-sized hiatal hernia. Electronically Signed   By: David  Martinique M.D.   On: 04/16/2017 17:08    EGD 8/3   Impression:      - Normal esophagus.                           - 11-12 cm hiatal hernia.                           - Non-bleeding erosive gastropathy.                           - Normal examined duodenum.                           - No specimens collected. Recommendation:           - Patient has a contact number available for                            emergencies. The signs and symptoms of potential  delayed complications were discussed with the                            patient. Return to normal activities tomorrow.                            Written discharge instructions were provided to the                            patient.                           - Resume regular  diet.                           - Continue present medications.       - Follow up in the office in 2 weeks.   Discharge Exam: Vitals:   04/17/17 1330 04/17/17 1357  BP: (!) 106/50 134/67  Pulse: 83 79  Resp: 13 16  Temp:  97.7 F (36.5 C)   Vitals:   04/17/17 1242 04/17/17 1326 04/17/17 1330 04/17/17 1357  BP: (!) 158/71 (!) 95/51 (!) 106/50 134/67  Pulse: 77 (!) 50 83 79  Resp: 17 16 13 16   Temp: 97.7 F (36.5 C)   97.7 F (36.5 C)  TempSrc: Oral   Oral  SpO2: 100% 92% 98% 94%  Weight:      Height:        General: Pt is alert, awake, not in acute distress Cardiovascular: RRR, S1/S2 +, no rubs, no gallops Respiratory: CTA bilaterally, no wheezing, no rhonchi Abdominal: Soft, NT, ND, bowel sounds + Extremities: no edema, no cyanosis    The results of significant diagnostics from this hospitalization (including imaging, microbiology, ancillary and laboratory) are listed below for reference.     Microbiology: No results found for this or any previous visit (from the past 240 hour(s)).   Labs: BNP (last 3 results)  Recent Labs  04/16/17 1636  BNP 67.6   Basic Metabolic Panel:  Recent Labs Lab 04/16/17 1636  NA 131*  K 4.6  CL 94*  CO2 31  GLUCOSE 95  BUN 15  CREATININE 0.47  CALCIUM 8.9   Liver Function Tests: No results for input(s): AST, ALT, ALKPHOS, BILITOT, PROT, ALBUMIN in the last 168 hours. No results for input(s): LIPASE, AMYLASE in the last 168 hours. No results for input(s): AMMONIA in the last 168 hours. CBC:  Recent Labs Lab 04/16/17 1636 04/17/17 0538 04/17/17 1017  WBC 4.4 4.1 3.5*  NEUTROABS 2.7  --   --   HGB 8.9* 8.3* 9.0*  HCT 28.2* 26.1* 28.8*  MCV 83.9 83.1 83.7  PLT 240 218 216   Cardiac Enzymes: No results for input(s): CKTOTAL, CKMB, CKMBINDEX, TROPONINI in the last 168 hours. BNP: Invalid input(s): POCBNP CBG: No results for input(s): GLUCAP in the last 168 hours. D-Dimer No results for input(s): DDIMER in  the last 72 hours. Hgb A1c No results for input(s): HGBA1C in the last 72 hours. Lipid Profile No results for input(s): CHOL, HDL, LDLCALC, TRIG, CHOLHDL, LDLDIRECT in the last 72 hours. Thyroid function studies No results for input(s): TSH, T4TOTAL, T3FREE, THYROIDAB in the last 72 hours.  Invalid input(s): FREET3 Anemia work up  [employer]  04/17/17 [place]  6*  TIBC 448  IRON 14*  RETICCTPCT 2.0   Urinalysis    Component Value Date/Time   COLORURINE YELLOW 04/16/2017 1637   APPEARANCEUR CLEAR 04/16/2017 1637   LABSPEC 1.009 04/16/2017 1637   PHURINE 7.5 04/16/2017 1637   GLUCOSEU NEGATIVE 04/16/2017 1637   HGBUR NEGATIVE 04/16/2017 1637   BILIRUBINUR NEGATIVE 04/16/2017 1637   KETONESUR NEGATIVE 04/16/2017 1637   PROTEINUR NEGATIVE 04/16/2017 1637   NITRITE NEGATIVE 04/16/2017 1637   LEUKOCYTESUR NEGATIVE 04/16/2017 1637   Sepsis Labs Invalid input(s): PROCALCITONIN,  WBC,  LACTICIDVEN Microbiology No results found for this or any previous visit (from the past 240 hour(s)).   Time coordinating discharge: 40 minutes  SIGNED:  Dessa Phi, DO [facility] Pager [phone]  If 7PM-7AM, please contact night-coverage [email or web address] Password [email or web address] 04/17/2017, 2:22 PM

## 2017-04-17 NOTE — H&P (Signed)
History and Physical  Patient Name: Jeanne Cruz     BJS:[id number]    DOB: 08/09/1928    DOA: 04/16/2017 PCP: System, Pcp Not In  Patient coming from: [facility]  Chief Complaint: Weakness, fatigue, melena      HPI: Jeanne Cruz is a 83 y.o. female with a past medical history significant for ventral hernia, remote GI bleed who presents with fatigue for a few weeks, now worsening and 1 week melena.  The patient moved back to [place] from [place] 6 months ago, and is in the process of reestablishing medical care here. While in [place], she did have a couple episodes where she had to be admitted to the hospital for fatigue, got a blood transfusion and EGD and was told that she had "a vein bleeding in my stomach".  Last episode was ~2016 she thinks.  Now in the last few weeks she has had progressive weakness/fatigue, similar to previous GIBs.  In the last few days her stools have been dark, and her fatigue has gotten severe, so today she came to the ER.  No bright red blood per rectum.  No epigastric pain.  No dizziness, syncope.  Does not use NSAIDs or anticoagulants, takes only baby aspirin.  ED course: -Afebrile, heart rate 95, respirations pulse ox normal, blood pressure 150/88 -Na 131, K 4.6, Cr 0.47, BUN 15 only, WBC 4.4, Hgb 8.9 (unknown baseline) -Rectal exam with brown stool, FOBT+ -Given Pepcid IV and TRH asked to evaluate overnight for GI consultation tomorrow     ROS: Review of Systems  Constitutional: Positive for malaise/fatigue.  Gastrointestinal: Positive for melena. Negative for abdominal pain, blood in stool, constipation, diarrhea, nausea and vomiting.  All other systems reviewed and are negative.         Past Medical History:  Diagnosis Date  . Anemia   . Anxiety   . Arthritis   . Degenerative disc disease, cervical   . GI bleeding   . Iron deficiency anemia   . Neuropathy   . Postherpetic neuralgia   . Shingles   . Thyroid disease     Past  Surgical History:  Procedure Laterality Date  . ABDOMINAL HYSTERECTOMY    . APPENDECTOMY    . BACK SURGERY    . TONSILLECTOMY      Social History: Patient lives at [facility].  She is from [place] originally, had moved to [place] 10 years ago to live with her daughter, but just moved back because her son in law died suddenly and now they are moving back to [place] and her daughter is still arranging her deceased husband's affairs back home.  The patient walks with a walker.  Nonsmoker.  Allergies  Allergen Reactions  . Ampicillin Diarrhea and Other (See Comments)    Has patient had a PCN reaction causing immediate rash, facial/tongue/throat swelling, SOB or lightheadedness with hypotension: No Has patient had a PCN reaction causing severe rash involving mucus membranes or skin necrosis: No Has patient had a PCN reaction that required hospitalization: No Has patient had a PCN reaction occurring within the last 10 years: No If all of the above answers are "NO", then may proceed with Cephalosporin use.  . Codeine Other (See Comments)    Reaction:  Somnolence   . Penicillins Diarrhea and Other (See Comments)    Has patient had a PCN reaction causing immediate rash, facial/tongue/throat swelling, SOB or lightheadedness with hypotension: No Has patient had a PCN reaction causing severe rash  involving mucus membranes or skin necrosis: No Has patient had a PCN reaction that required hospitalization: No Has patient had a PCN reaction occurring within the last 10 years: No If all of the above answers are "NO", then may proceed with Cephalosporin use.    Family history: family history includes Colon cancer in her sister; Heart attack in her sister.  Prior to Admission medications   Medication Sig Start Date End Date Taking? Authorizing Provider  albuterol (PROVENTIL HFA;VENTOLIN HFA) 108 (90 Base) MCG/ACT inhaler Inhale 1-2 puffs into the lungs every 6 (six) hours as needed for wheezing or  shortness of breath.    Yes [provider]  ALPRAZolam Duanne Moron) 0.5 MG tablet Take 0.5 mg by mouth 2 (two) times daily.   Yes [provider]  dorzolamide-timolol (COSOPT) 22.3-6.8 MG/ML ophthalmic solution Place 1 drop into both eyes 2 (two) times daily.    Yes [provider]  furosemide (LASIX) 20 MG tablet Take 20 mg by mouth daily as needed for edema.    Yes [provider]  gabapentin (NEURONTIN) 100 MG capsule Take 100-200 mg by mouth 3 (three) times daily.    Yes [provider]  Multiple Vitamin (MULTIVITAMIN WITH MINERALS) TABS tablet Take 1 tablet by mouth daily.   Yes [provider]  nystatin (NYSTATIN) powder Apply 2 g topically 2 (two) times daily as needed (for irritation).   Yes [provider]  polyethylene glycol (MIRALAX / GLYCOLAX) packet Take 17 g by mouth 2 (two) times daily.   Yes [provider]  traMADol (ULTRAM) 50 MG tablet Take 50 mg by mouth every 6 (six) hours as needed for moderate pain.    Yes [provider]  Vitamin D, Ergocalciferol, (DRISDOL) 50000 units CAPS capsule Take 50,000 Units by mouth every 14 (fourteen) days.   Yes [provider]       Physical Exam: BP (!) 151/66 (BP Location: Right Arm)   Pulse 80   Temp 98 F (36.7 C) (Oral)   Resp 15   Ht 4\' 11"  (1.499 m)   Wt 40.8 kg (90 lb)   SpO2 97%   BMI 18.18 kg/m  General appearance: Thin frail adult female, alert and in no acute distress.   Eyes: Anicteric, conjunctiva pink, lids and lashes normal. PERRL.    ENT: No nasal deformity, discharge, epistaxis.  Hearing normal. OP moist without lesions.   Neck: No neck masses.  Trachea midline.  No thyromegaly/tenderness. Skin: Warm and dry.  No jaundice.  No suspicious rashes or lesions. Cardiac: RRR, nl S1-S2, no murmurs appreciated.  Capillary refill is brisk.  JVP not visible.  2+ pitting LE edema at shins.  Radial pulses 2+ and symmetric. Respiratory:  Normal respiratory rate and rhythm.  CTAB without rales or wheezes. Abdomen: Abdomen soft.  No TTP. There is a large ventral hernia of some kind, can't tell if it is umbilical, she reports no surgeries there. MSK: No effusions. Old fairly severe deformites of hands from arthritis. No cyanosis or clubbing. Neuro: Cranial nerves 3-12 intact.  Sensation intact to light touch. Speech is fluent.  Muscle strength 5-/5 but symmetric.    Psych: Sensorium intact and responding to questions, attention normal.  Behavior appropriate.  Affect normal.  Judgment and insight appear normal.     Labs on Admission:  I have personally reviewed following labs and imaging studies: CBC:  Recent Labs Lab 04/16/17 1636  WBC 4.4  NEUTROABS 2.7  HGB 8.9*  HCT  28.2*  MCV 83.9  PLT 371   Basic Metabolic Panel:  Recent Labs Lab 04/16/17 1636  NA 131*  K 4.6  CL 94*  CO2 31  GLUCOSE 95  BUN 15  CREATININE 0.47  CALCIUM 8.9   GFR: Estimated Creatinine Clearance: 31.9 mL/min (by C-G formula based on SCr of 0.47 mg/dL).       Radiological Exams on Admission: Personally reviewed CXR clear, hiatal hernia present: Dg Chest 2 View  Result Date: 04/16/2017 CLINICAL DATA:  Dizziness and shortness of breath as well as rectal bleeding. History of colonic polyps, anemia, recurrent episodes of bronchitis. EXAM: CHEST  2 VIEW COMPARISON:  Report of a chest x-ray dated June 04, 2003. FINDINGS: The lungs are adequately inflated and clear. The heart is normal in size. The pulmonary vascularity is not engorged. There is tortuosity of the ascending and descending thoracic aorta. There is no pleural effusion. There is moderate dextrocurvature of the spine centered in the lower thoracic region. There is a moderate-sized hiatal hernia. IMPRESSION: There is no pneumonia, CHF, nor other acute cardiopulmonary disease. Moderate-sized hiatal hernia. Electronically Signed   By: David  Martinique M.D.   On: 04/16/2017 17:08     EKG: Independently reviewed. Rate 80, QTc 427, no ST changes.        Assessment/Plan  1. GI bleeding:  The story is one of melena and previous UGIB.   -PPI IV -Consult to GI -NPO and MIVF -Obtain old records from [place]   2. Anemia: Presumed acute blood loss anemia, unknown baseline.  Defer transfusion for now. -Repeat CBC this AM to trend -Monitor for signs of blood loss -Check iron studies  3. Other medications:  -Continue gabapentin -Continue tramadol  4. Anxiety:  -Continue Xanax             DVT prophylaxis: SCDs  Code Status: FULL  Family Communication: None present  Disposition Plan: Anticipate IV PPI, GI consult, and NPO until GI eval. Consults called: None overnight Admission status: OBS At the point of initial evaluation, it is my clinical opinion that admission for OBSERVATION is reasonable and necessary because the patient's presenting complaints in the context of their chronic conditions represent sufficient risk of deterioration or significant morbidity to constitute reasonable grounds for close observation in the hospital setting, but that the patient may be medically stable for discharge from the hospital within 24 to 48 hours.    Medical decision making: Patient seen at 2:00 AM on 04/17/2017.  The patient was discussed with Dr. Wilson Singer.  What exists of the patient's chart was reviewed in depth and summarized above.  Clinical condition: stable hemodynamically, no reported blood loss in ER or since transport.        Edwin Dada [facility] Pager [phone]

## 2017-04-17 NOTE — Progress Notes (Signed)
Discharge instructions discussed with patient and caregiver,verbalized agreement and understanding

## 2017-04-17 NOTE — Anesthesia Postprocedure Evaluation (Signed)
Anesthesia Post Note  Patient: Jeanne Cruz  Procedure(s) Performed: Procedure(s) (LRB): ESOPHAGOGASTRODUODENOSCOPY (EGD) (N/A)     Patient location during evaluation: PACU Anesthesia Type: MAC Level of consciousness: awake and alert Pain management: pain level controlled Vital Signs Assessment: post-procedure vital signs reviewed and stable Respiratory status: spontaneous breathing, nonlabored ventilation, respiratory function stable and patient connected to nasal cannula oxygen Cardiovascular status: stable and blood pressure returned to baseline Anesthetic complications: no    Last Vitals:  Vitals:   04/17/17 1330 04/17/17 1357  BP: (!) 106/50 134/67  Pulse: 83 79  Resp: 13 16  Temp:  36.5 C    Last Pain:  Vitals:   04/17/17 1357  TempSrc: Oral  PainSc:                  [name]

## 2017-04-17 NOTE — Progress Notes (Signed)
Pt requesting Nystatin powder for redness beneath bilateral breasts. Reports that this is not the first time that she has had this condition. Reports to having "a brand new bottle of Nystatin powder at home."

## 2017-04-17 NOTE — Discharge Instructions (Signed)
Gastrointestinal Bleeding °Gastrointestinal bleeding is bleeding somewhere along the path food travels through the body (digestive tract). This path is anywhere between the mouth and the opening of the butt (anus). You may have blood in your poop (stools) or have black poop. If you throw up (vomit), there may be blood in it. °This condition can be mild, serious, or even life-threatening. If you have a lot of bleeding, you may need to stay in the hospital. °Follow these instructions at home: °· Take over-the-counter and prescription medicines only as told by your doctor. °· Eat foods that have a lot of fiber in them. These foods include whole grains, fruits, and vegetables. You can also try eating 1-3 prunes each day. °· Drink enough fluid to keep your pee (urine) clear or pale yellow. °· Keep all follow-up visits as told by your doctor. This is important. °Contact a doctor if: °· Your symptoms do not get better. °Get help right away if: °· Your bleeding gets worse. °· You feel dizzy or you pass out (faint). °· You feel weak. °· You have very bad cramps in your back or belly (abdomen). °· You pass large clumps of blood (clots) in your poop. °· Your symptoms are getting worse. °This information is not intended to replace advice given to you by your health care provider. Make sure you discuss any questions you have with your health care provider. °Document Released: 06/10/2008 Document Revised: 02/07/2016 Document Reviewed: 02/19/2015 °Elsevier Interactive Patient Education © 2018 [employer] ° °

## 2017-04-17 NOTE — Consult Note (Signed)
Reason for Consult: Melena and anemia Referring Physician: [facility]  Lars Pinks HPI: This is an 83 year old female with a PMH of a GI bleeding in 2016, per her report, ventral abdominal hernia, and neuropathy who is admitted for progressive weakness and melena over the past week.  Over the past two weeks she started to notice some fatigue, but it was a significant problem this week.  It was also associated with melena.  She was evaluated in [place] two years ago with a similar presentation and she was told to avoid NSAIDs.  At that time she was taking Celebrex, but since that time she denies taking any further NSAIDs.  The patient denies any issues with hematochezia, diarrhea, constipation, nausea, vomiting, or abdominal.  Her admission HGB was at 8.9 g/dL, but in 2009 her HGB was at 14.7 g/dL.  Past Medical History:  Diagnosis Date  . Anemia   . Anxiety   . Arthritis   . Degenerative disc disease, cervical   . GI bleeding   . Iron deficiency anemia   . Neuropathy   . Postherpetic neuralgia   . Shingles   . Thyroid disease     Past Surgical History:  Procedure Laterality Date  . ABDOMINAL HYSTERECTOMY    . APPENDECTOMY    . BACK SURGERY    . TONSILLECTOMY      Family History  Problem Relation Age of Onset  . Colon cancer Sister   . Heart attack Sister     Social History:  reports that she has never smoked. She has never used smokeless tobacco. She reports that she does not drink alcohol or use drugs.  Allergies:  Allergies  Allergen Reactions  . Ampicillin Diarrhea and Other (See Comments)    Has patient had a PCN reaction causing immediate rash, facial/tongue/throat swelling, SOB or lightheadedness with hypotension: No Has patient had a PCN reaction causing severe rash involving mucus membranes or skin necrosis: No Has patient had a PCN reaction that required hospitalization: No Has patient had a PCN reaction occurring within the last 10 years: No If all  of the above answers are "NO", then may proceed with Cephalosporin use.  . Codeine Other (See Comments)    Reaction:  Somnolence   . Penicillins Diarrhea and Other (See Comments)    Has patient had a PCN reaction causing immediate rash, facial/tongue/throat swelling, SOB or lightheadedness with hypotension: No Has patient had a PCN reaction causing severe rash involving mucus membranes or skin necrosis: No Has patient had a PCN reaction that required hospitalization: No Has patient had a PCN reaction occurring within the last 10 years: No If all of the above answers are "NO", then may proceed with Cephalosporin use.    Medications:  Scheduled: . ALPRAZolam  0.5 mg Oral BID  . dorzolamide-timolol  1 drop Both Eyes BID  . gabapentin  100 mg Oral BID AC  . gabapentin  200 mg Oral QHS  . nystatin   Topical TID  . pantoprazole (PROTONIX) IV  40 mg Intravenous Q12H  . polyethylene glycol  17 g Oral BID   Continuous: . sodium chloride 75 mL/hr (04/17/17 0801)    Results for orders placed or performed during the hospital encounter of 04/16/17 (from the past 24 hour(s))  CBC with Differential     Status: Abnormal   Collection Time: 04/16/17  4:36 PM  Result Value Ref Range   WBC 4.4 4.0 - 10.5 K/uL   RBC 3.36 (L)  3.87 - 5.11 MIL/uL   Hemoglobin 8.9 (L) 12.0 - 15.0 g/dL   HCT 28.2 (L) 36.0 - 46.0 %   MCV 83.9 78.0 - 100.0 fL   MCH 26.5 26.0 - 34.0 pg   MCHC 31.6 30.0 - 36.0 g/dL   RDW 14.9 11.5 - 15.5 %   Platelets 240 150 - 400 K/uL   Neutrophils Relative % 61 %   Neutro Abs 2.7 1.7 - 7.7 K/uL   Lymphocytes Relative 25 %   Lymphs Abs 1.1 0.7 - 4.0 K/uL   Monocytes Relative 11 %   Monocytes Absolute 0.5 0.1 - 1.0 K/uL   Eosinophils Relative 2 %   Eosinophils Absolute 0.1 0.0 - 0.7 K/uL   Basophils Relative 1 %   Basophils Absolute 0.0 0.0 - 0.1 K/uL  Brain natriuretic peptide     Status: None   Collection Time: 04/16/17  4:36 PM  Result Value Ref Range   B Natriuretic Peptide  59.1 0.0 - 100.0 pg/mL  Basic metabolic panel     Status: Abnormal   Collection Time: 04/16/17  4:36 PM  Result Value Ref Range   Sodium 131 (L) 135 - 145 mmol/L   Potassium 4.6 3.5 - 5.1 mmol/L   Chloride 94 (L) 101 - 111 mmol/L   CO2 31 22 - 32 mmol/L   Glucose, Bld 95 65 - 99 mg/dL   BUN 15 6 - 20 mg/dL   Creatinine, Ser 0.47 0.44 - 1.00 mg/dL   Calcium 8.9 8.9 - 10.3 mg/dL   GFR calc non Af Amer >60 >60 mL/min   GFR calc Af Amer >60 >60 mL/min   Anion gap 6 5 - 15  Urinalysis, Routine w reflex microscopic     Status: None   Collection Time: 04/16/17  4:37 PM  Result Value Ref Range   Color, Urine YELLOW YELLOW   APPearance CLEAR CLEAR   Specific Gravity, Urine 1.009 1.005 - 1.030   pH 7.5 5.0 - 8.0   Glucose, UA NEGATIVE NEGATIVE mg/dL   Hgb urine dipstick NEGATIVE NEGATIVE   Bilirubin Urine NEGATIVE NEGATIVE   Ketones, ur NEGATIVE NEGATIVE mg/dL   Protein, ur NEGATIVE NEGATIVE mg/dL   Nitrite NEGATIVE NEGATIVE   Leukocytes, UA NEGATIVE NEGATIVE  Occult blood card to lab, stool RN will collect     Status: Abnormal   Collection Time: 04/16/17  5:10 PM  Result Value Ref Range   Fecal Occult Bld POSITIVE (A) NEGATIVE  Type and screen [place]     Status: None   Collection Time: 04/17/17  5:38 AM  Result Value Ref Range   ABO/RH(D) A POS    Antibody Screen NEG    Sample Expiration 04/20/2017   CBC     Status: Abnormal   Collection Time: 04/17/17  5:38 AM  Result Value Ref Range   WBC 4.1 4.0 - 10.5 K/uL   RBC 3.14 (L) 3.87 - 5.11 MIL/uL   Hemoglobin 8.3 (L) 12.0 - 15.0 g/dL   HCT 26.1 (L) 36.0 - 46.0 %   MCV 83.1 78.0 - 100.0 fL   MCH 26.4 26.0 - 34.0 pg   MCHC 31.8 30.0 - 36.0 g/dL   RDW 15.2 11.5 - 15.5 %   Platelets 218 150 - 400 K/uL  Iron and TIBC     Status: Abnormal   Collection Time: 04/17/17  5:38 AM  Result Value Ref Range   Iron 14 (L) 28 - 170 ug/dL   TIBC  448 250 - 450 ug/dL   Saturation Ratios 3 (L) 10.4 - 31.8 %   UIBC 434  ug/dL  Ferritin     Status: Abnormal   Collection Time: 04/17/17  5:38 AM  Result Value Ref Range   Ferritin 6 (L) 11 - 307 ng/mL  Reticulocytes     Status: Abnormal   Collection Time: 04/17/17  5:38 AM  Result Value Ref Range   Retic Ct Pct 2.0 0.4 - 3.1 %   RBC. 3.14 (L) 3.87 - 5.11 MIL/uL   Retic Count, Absolute 62.8 19.0 - 186.0 K/uL  ABO/Rh     Status: None   Collection Time: 04/17/17  5:38 AM  Result Value Ref Range   ABO/RH(D) A POS   CBC     Status: Abnormal   Collection Time: 04/17/17 10:17 AM  Result Value Ref Range   WBC 3.5 (L) 4.0 - 10.5 K/uL   RBC 3.44 (L) 3.87 - 5.11 MIL/uL   Hemoglobin 9.0 (L) 12.0 - 15.0 g/dL   HCT 28.8 (L) 36.0 - 46.0 %   MCV 83.7 78.0 - 100.0 fL   MCH 26.2 26.0 - 34.0 pg   MCHC 31.3 30.0 - 36.0 g/dL   RDW 15.1 11.5 - 15.5 %   Platelets 216 150 - 400 K/uL     Dg Chest 2 View  Result Date: 04/16/2017 CLINICAL DATA:  Dizziness and shortness of breath as well as rectal bleeding. History of colonic polyps, anemia, recurrent episodes of bronchitis. EXAM: CHEST  2 VIEW COMPARISON:  Report of a chest x-ray dated June 04, 2003. FINDINGS: The lungs are adequately inflated and clear. The heart is normal in size. The pulmonary vascularity is not engorged. There is tortuosity of the ascending and descending thoracic aorta. There is no pleural effusion. There is moderate dextrocurvature of the spine centered in the lower thoracic region. There is a moderate-sized hiatal hernia. IMPRESSION: There is no pneumonia, CHF, nor other acute cardiopulmonary disease. Moderate-sized hiatal hernia. Electronically Signed   By: David  Martinique M.D.   On: 04/16/2017 17:08    ROS:  As stated above in the HPI otherwise negative.  Blood pressure (!) 147/76, pulse 83, temperature 98.1 F (36.7 C), temperature source Oral, resp. rate 16, height 4\' 11"  (1.499 m), weight 40.8 kg (90 lb), SpO2 90 %.    PE: Gen: NAD, Alert and Oriented HEENT:  [place]/AT, EOMI Neck: Supple, no  LAD Lungs: CTA Bilaterally CV: RRR without M/G/R ABM: Soft, NTND, +BS, large ventral hernia Ext: No C/C/E  Assessment/Plan: 1) Melena. 2) Heme positive stool. 3) Anemia.   Per his history, she may have suffered from NSAID-induced ulcerations.  I am not certain about the source at this time.  AVMs are a significant consideration.  I will perform an EGD for further evaluation.  Plan: 1) EGD today.  Cutberto Winfree D 04/17/2017, 10:53 AM

## 2017-04-17 NOTE — Progress Notes (Signed)
CSW has confirmed pt is from [facility] not [facility]F.  RNCM will assist with dc planning, if needed.  Werner Lean LCSW [phone]

## 2017-04-17 NOTE — Transfer of Care (Signed)
Immediate Anesthesia Transfer of Care Note  Patient: Jeanne Cruz  Procedure(s) Performed: Procedure(s): ESOPHAGOGASTRODUODENOSCOPY (EGD) (N/A)  Patient Location: PACU  Anesthesia Type:MAC  Level of Consciousness: sedated  Airway & Oxygen Therapy: Patient Spontanous Breathing and Patient connected to nasal cannula oxygen  Post-op Assessment: Report given to RN and Post -op Vital signs reviewed and stable  Post vital signs: Reviewed and stable  Last Vitals:  Vitals:   04/17/17 1242 04/17/17 1326  BP: (!) 158/71   Pulse: 77 (!) 50  Resp: 17 16  Temp: 36.5 C     Last Pain:  Vitals:   04/17/17 1310  TempSrc:   PainSc: 0-No pain      Patients Stated Pain Goal: 4 (89/37/34 2876)  Complications: No apparent anesthesia complications

## 2017-04-17 NOTE — Op Note (Signed)
[facility] Patient Name: Jeanne Cruz Procedure Date: 04/17/2017 MRN: [id number] Attending MD: Carol Ada , MD Date of Birth: [birth year] CSN: [id number] Age: 83 Admit Type: Outpatient Procedure:                Upper GI endoscopy Indications:              Iron deficiency anemia, Melena Providers:                Carol Ada, MD, Laverta Baltimore RN, RN, Alan Mulder, Technician Referring MD:              Medicines:                Propofol per Anesthesia Complications:            No immediate complications. Estimated Blood Loss:     Estimated blood loss: none. Procedure:                Pre-Anesthesia Assessment:                           - Prior to the procedure, a History and Physical                            was performed, and patient medications and                            allergies were reviewed. The patient's tolerance of                            previous anesthesia was also reviewed. The risks                            and benefits of the procedure and the sedation                            options and risks were discussed with the patient.                            All questions were answered, and informed consent                            was obtained. Prior Anticoagulants: The patient has                            taken no previous anticoagulant or antiplatelet                            agents. ASA Grade Assessment: II - A patient with                            mild systemic disease. After reviewing the risks  and benefits, the patient was deemed in                            satisfactory condition to undergo the procedure.                           - Sedation was administered by an anesthesia                            professional. Deep sedation was attained.                           After obtaining informed consent, the endoscope was                            passed under direct vision.  Throughout the                            procedure, the patient's blood pressure, pulse, and                            oxygen saturations were monitored continuously. The                            [id number] ([id number]) scope was introduced through the                            mouth, and advanced to the second part of duodenum.                            The upper GI endoscopy was technically difficult                            and complex due to abnormal anatomy. Successful                            completion of the procedure was aided by applying                            abdominal pressure. Scope In: Scope Out: Findings:      The esophagus was normal.      A 11-12 cm hiatal hernia was present.      Multiple localized, non-bleeding erosions were found in the gastric       body. There were no stigmata of recent bleeding. These were Cameron's       lesions.      The examined duodenum was normal.      The endoscopy was very difficult to perform as a result of her ventral       hernia and large hiatal hernia. Abdominal pressure was required to allow       for eventual passage of the endoscope into the duodenum. The Cameron's       lesions are most likely the source of the bleeding. No evidence of AVMs,       inflammation, or masses. Impression:               -  Normal esophagus.                           - 11-12 cm hiatal hernia.                           - Non-bleeding erosive gastropathy.                           - Normal examined duodenum.                           - No specimens collected. Moderate Sedation:      N/A- Per Anesthesia Care Recommendation:           - Patient has a contact number available for                            emergencies. The signs and symptoms of potential                            delayed complications were discussed with the                            patient. Return to normal activities tomorrow.                            Written discharge  instructions were provided to the                            patient.                           - Resume regular diet.                           - Continue present medications.                           - Follow up in the office in 2 weeks. Procedure Code(s):        --- Professional ---                           [phone], Esophagogastroduodenoscopy, flexible,                            transoral; diagnostic, including collection of                            specimen(s) by brushing or washing, when performed                            (separate procedure) Diagnosis Code(s):        --- Professional ---                           K31.89, Other diseases of stomach and duodenum  K44.9, Diaphragmatic hernia without obstruction or                            gangrene                           D50.9, Iron deficiency anemia, unspecified                           K92.1, Melena (includes Hematochezia) CPT copyright 2016 American [employer] Association. All rights reserved. The codes documented in this report are preliminary and upon coder review may  be revised to meet current compliance requirements. Carol Ada, MD Carol Ada, MD 04/17/2017 1:32:11 PM This report has been signed electronically. Number of Addenda: 0

## 2017-04-17 NOTE — Anesthesia Preprocedure Evaluation (Signed)
Anesthesia Evaluation  Patient identified by MRN, date of birth, ID band Patient awake    Reviewed: Allergy & Precautions, NPO status , Patient's Chart, lab work & pertinent test results  Airway Mallampati: II  TM Distance: >3 FB Neck ROM: Full    Dental no notable dental hx.    Pulmonary neg pulmonary ROS,    Pulmonary exam normal breath sounds clear to auscultation       Cardiovascular negative cardio ROS Normal cardiovascular exam Rhythm:Regular Rate:Normal     Neuro/Psych negative neurological ROS  negative psych ROS   GI/Hepatic Neg liver ROS,   Endo/Other  negative endocrine ROS  Renal/GU negative Renal ROS  negative genitourinary   Musculoskeletal negative musculoskeletal ROS (+)   Abdominal   Peds negative pediatric ROS (+)  Hematology negative hematology ROS (+)   Anesthesia Other Findings   Reproductive/Obstetrics negative OB ROS                             Anesthesia Physical Anesthesia Plan  ASA: II  Anesthesia Plan: MAC   Post-op Pain Management:    Induction:   PONV Risk Score and Plan:   Airway Management Planned: Nasal Cannula  Additional Equipment:   Intra-op Plan:   Post-operative Plan:   Informed Consent: I have reviewed the patients History and Physical, chart, labs and discussed the procedure including the risks, benefits and alternatives for the proposed anesthesia with the patient or authorized representative who has indicated his/her understanding and acceptance.   Dental advisory given  Plan Discussed with:   Anesthesia Plan Comments:         Anesthesia Quick Evaluation

## 2017-04-18 LAB — ABO/RH: ABO/RH(D): A POS

## 2017-04-27 ENCOUNTER — Encounter (HOSPITAL_COMMUNITY): Payer: Self-pay | Admitting: Nurse Practitioner

## 2017-04-27 ENCOUNTER — Emergency Department (HOSPITAL_COMMUNITY): Payer: Medicare Other

## 2017-04-27 ENCOUNTER — Observation Stay (HOSPITAL_COMMUNITY): Payer: Medicare Other

## 2017-04-27 ENCOUNTER — Observation Stay (HOSPITAL_COMMUNITY)
Admission: EM | Admit: 2017-04-27 | Discharge: 2017-04-28 | Disposition: A | Discharge status: Home / Self Care | Payer: Medicare Other | Attending: Internal Medicine | Admitting: Internal Medicine

## 2017-04-27 DIAGNOSIS — E785 Hyperlipidemia, unspecified: Secondary | ICD-10-CM | POA: Diagnosis not present

## 2017-04-27 DIAGNOSIS — I10 Essential (primary) hypertension: Secondary | ICD-10-CM | POA: Diagnosis not present

## 2017-04-27 DIAGNOSIS — I639 Cerebral infarction, unspecified: Principal | ICD-10-CM | POA: Diagnosis present

## 2017-04-27 DIAGNOSIS — D649 Anemia, unspecified: Secondary | ICD-10-CM | POA: Insufficient documentation

## 2017-04-27 DIAGNOSIS — Z7982 Long term (current) use of aspirin: Secondary | ICD-10-CM | POA: Diagnosis not present

## 2017-04-27 DIAGNOSIS — E871 Hypo-osmolality and hyponatremia: Secondary | ICD-10-CM | POA: Diagnosis not present

## 2017-04-27 DIAGNOSIS — D509 Iron deficiency anemia, unspecified: Secondary | ICD-10-CM | POA: Diagnosis not present

## 2017-04-27 DIAGNOSIS — Z862 Personal history of diseases of the blood and blood-forming organs and certain disorders involving the immune mechanism: Secondary | ICD-10-CM

## 2017-04-27 DIAGNOSIS — M1990 Unspecified osteoarthritis, unspecified site: Secondary | ICD-10-CM | POA: Diagnosis not present

## 2017-04-27 DIAGNOSIS — F411 Generalized anxiety disorder: Secondary | ICD-10-CM | POA: Diagnosis not present

## 2017-04-27 DIAGNOSIS — R531 Weakness: Secondary | ICD-10-CM | POA: Diagnosis not present

## 2017-04-27 DIAGNOSIS — Z880 Allergy status to penicillin: Secondary | ICD-10-CM | POA: Diagnosis not present

## 2017-04-27 DIAGNOSIS — M542 Cervicalgia: Secondary | ICD-10-CM | POA: Insufficient documentation

## 2017-04-27 DIAGNOSIS — K3189 Other diseases of stomach and duodenum: Secondary | ICD-10-CM

## 2017-04-27 LAB — I-STAT CHEM 8, ED
BUN: 11 mg/dL (ref 6–20)
Calcium, Ion: 1.15 mmol/L (ref 1.15–1.40)
Chloride: 90 mmol/L — ABNORMAL LOW (ref 101–111)
Creatinine, Ser: 0.6 mg/dL (ref 0.44–1.00)
Glucose, Bld: 89 mg/dL (ref 65–99)
HEMATOCRIT: 35 % — AB (ref 36.0–46.0)
HEMOGLOBIN: 11.9 g/dL — AB (ref 12.0–15.0)
POTASSIUM: 4 mmol/L (ref 3.5–5.1)
SODIUM: 129 mmol/L — AB (ref 135–145)
TCO2: 28 mmol/L (ref 0–100)

## 2017-04-27 LAB — COMPREHENSIVE METABOLIC PANEL
ALT: 16 U/L (ref 14–54)
ANION GAP: 9 (ref 5–15)
AST: 24 U/L (ref 15–41)
Albumin: 3.8 g/dL (ref 3.5–5.0)
Alkaline Phosphatase: 69 U/L (ref 38–126)
BUN: 10 mg/dL (ref 6–20)
CO2: 28 mmol/L (ref 22–32)
Calcium: 9.2 mg/dL (ref 8.9–10.3)
Chloride: 93 mmol/L — ABNORMAL LOW (ref 101–111)
Creatinine, Ser: 0.51 mg/dL (ref 0.44–1.00)
Glucose, Bld: 92 mg/dL (ref 65–99)
POTASSIUM: 4 mmol/L (ref 3.5–5.1)
Sodium: 130 mmol/L — ABNORMAL LOW (ref 135–145)
Total Bilirubin: 0.5 mg/dL (ref 0.3–1.2)
Total Protein: 6.4 g/dL — ABNORMAL LOW (ref 6.5–8.1)

## 2017-04-27 LAB — CBC
HEMATOCRIT: 32.5 % — AB (ref 36.0–46.0)
Hemoglobin: 10.1 g/dL — ABNORMAL LOW (ref 12.0–15.0)
MCH: 26.7 pg (ref 26.0–34.0)
MCHC: 31.1 g/dL (ref 30.0–36.0)
MCV: 86 fL (ref 78.0–100.0)
Platelets: 237 10*3/uL (ref 150–400)
RBC: 3.78 MIL/uL — ABNORMAL LOW (ref 3.87–5.11)
RDW: 20 % — AB (ref 11.5–15.5)
WBC: 5.9 10*3/uL (ref 4.0–10.5)

## 2017-04-27 LAB — PROTIME-INR
INR: 1.02
Prothrombin Time: 13.4 seconds (ref 11.4–15.2)

## 2017-04-27 LAB — DIFFERENTIAL
BASOS ABS: 0 10*3/uL (ref 0.0–0.1)
BASOS PCT: 0 %
EOS ABS: 0.1 10*3/uL (ref 0.0–0.7)
EOS PCT: 1 %
Lymphocytes Relative: 15 %
Lymphs Abs: 0.9 10*3/uL (ref 0.7–4.0)
MONOS PCT: 11 %
Monocytes Absolute: 0.6 10*3/uL (ref 0.1–1.0)
Neutro Abs: 4.3 10*3/uL (ref 1.7–7.7)
Neutrophils Relative %: 73 %

## 2017-04-27 LAB — APTT: APTT: 34 s (ref 24–36)

## 2017-04-27 LAB — I-STAT TROPONIN, ED: TROPONIN I, POC: 0.01 ng/mL (ref 0.00–0.08)

## 2017-04-27 MED ORDER — STROKE: EARLY STAGES OF RECOVERY BOOK
Freq: Once | Status: AC
  Administered 2017-04-27: 22:00:00
  Filled 2017-04-27: qty 1

## 2017-04-27 MED ORDER — SODIUM CHLORIDE 0.9 % IV SOLN
INTRAVENOUS | Status: AC
  Administered 2017-04-28: via INTRAVENOUS

## 2017-04-27 MED ORDER — ALBUTEROL SULFATE (2.5 MG/3ML) 0.083% IN NEBU: 3.0000 mL | INHALATION_SOLUTION | Freq: Four times a day (QID) | RESPIRATORY_TRACT | Status: DC | PRN

## 2017-04-27 MED ORDER — PANTOPRAZOLE SODIUM 40 MG PO TBEC
40.0000 mg | DELAYED_RELEASE_TABLET | Freq: Two times a day (BID) | ORAL | Status: DC
  Administered 2017-04-28 (×3): 40 mg via ORAL
  Filled 2017-04-27 (×3): qty 1

## 2017-04-27 MED ORDER — TRAMADOL HCL 50 MG PO TABS
50.0000 mg | ORAL_TABLET | Freq: Four times a day (QID) | ORAL | Status: DC | PRN
  Administered 2017-04-28: 50 mg via ORAL
  Filled 2017-04-27 (×2): qty 1

## 2017-04-27 MED ORDER — ACETAMINOPHEN 325 MG PO TABS: 650.0000 mg | ORAL_TABLET | ORAL | Status: DC | PRN

## 2017-04-27 MED ORDER — ALPRAZOLAM 0.25 MG PO TABS
0.5000 mg | ORAL_TABLET | Freq: Two times a day (BID) | ORAL | Status: DC
  Administered 2017-04-28 (×2): 0.5 mg via ORAL
  Filled 2017-04-27 (×3): qty 2

## 2017-04-27 MED ORDER — GABAPENTIN 100 MG PO CAPS
200.0000 mg | ORAL_CAPSULE | Freq: Every day | ORAL | Status: DC
  Administered 2017-04-28 (×2): 200 mg via ORAL
  Filled 2017-04-27 (×2): qty 2

## 2017-04-27 MED ORDER — POLYETHYLENE GLYCOL 3350 17 G PO PACK
17.0000 g | PACK | Freq: Two times a day (BID) | ORAL | Status: DC
  Administered 2017-04-28: 17 g via ORAL
  Filled 2017-04-27 (×3): qty 1

## 2017-04-27 MED ORDER — ENOXAPARIN SODIUM 30 MG/0.3ML ~~LOC~~ SOLN
20.0000 mg | SUBCUTANEOUS | Status: DC
  Filled 2017-04-27: qty 0.3
  Filled 2017-04-27: qty 0.2

## 2017-04-27 MED ORDER — ACETAMINOPHEN 650 MG RE SUPP: 650.0000 mg | RECTAL | Status: DC | PRN

## 2017-04-27 MED ORDER — ACETAMINOPHEN 160 MG/5ML PO SOLN: 650.0000 mg | ORAL | Status: DC | PRN

## 2017-04-27 MED ORDER — SENNOSIDES-DOCUSATE SODIUM 8.6-50 MG PO TABS
1.0000 | ORAL_TABLET | Freq: Every evening | ORAL | Status: DC | PRN
  Filled 2017-04-27: qty 1

## 2017-04-27 MED ORDER — ADULT MULTIVITAMIN W/MINERALS CH
1.0000 | ORAL_TABLET | Freq: Every day | ORAL | Status: DC
  Administered 2017-04-28: 1 via ORAL
  Filled 2017-04-27: qty 1

## 2017-04-27 MED ORDER — FENTANYL CITRATE (PF) 100 MCG/2ML IJ SOLN
50.0000 ug | Freq: Once | INTRAMUSCULAR | Status: AC
  Administered 2017-04-27: 50 ug via INTRAVENOUS
  Filled 2017-04-27: qty 2

## 2017-04-27 MED ORDER — DORZOLAMIDE HCL-TIMOLOL MAL 2-0.5 % OP SOLN
1.0000 [drp] | Freq: Two times a day (BID) | OPHTHALMIC | Status: DC
  Administered 2017-04-28 (×3): 1 [drp] via OPHTHALMIC
  Filled 2017-04-27: qty 10

## 2017-04-27 MED ORDER — GABAPENTIN 100 MG PO CAPS
100.0000 mg | ORAL_CAPSULE | Freq: Two times a day (BID) | ORAL | Status: DC
  Administered 2017-04-28 (×2): 100 mg via ORAL
  Filled 2017-04-27 (×2): qty 1

## 2017-04-27 MED ORDER — GABAPENTIN 100 MG PO CAPS: 100.0000 mg | ORAL_CAPSULE | Freq: Three times a day (TID) | ORAL | Status: DC

## 2017-04-27 NOTE — ED Triage Notes (Signed)
Pt presents with c/o stroke symptoms. Her symptoms began yesterday afternoon. She reports headahce and numbness in right side of body. Then she woke this am with red and blue circles in her vision. Her symptoms have been persistent since onset. She went to her eye doctor this afternoon and was referred her to ED for stroke work up.

## 2017-04-27 NOTE — ED Notes (Signed)
Unable to obtain an accurate Pulse Ox

## 2017-04-27 NOTE — ED Notes (Signed)
Pt still in MRI 

## 2017-04-27 NOTE — ED Provider Notes (Signed)
[facility] DEPT Provider Note   CSN: [id number] Arrival date & time: 04/27/17  1640     History   Chief Complaint Chief Complaint  Patient presents with  . Stroke Symptoms    HPI Jeanne Cruz is a 83 y.o. female.  Patient with history of gastritis, iron deficiency anemia, glaucoma -- presents with complaint of red and blue "marbles" in her left gaze. Patient states that she developed a headache yesterday at approximately 1 PM. She had some vague right-sided numbness but denies any focal weakness. Upon waking this morning, she noted the spots in her vision. She saw an ophthalmologist today who is kind sure that she had a stroke and sent her to the emergency department for further evaluation. Headache has been persistent. Patient walks with a walker and continues to ambulate at her baseline. No speech change. No facial droop. The onset of this condition was acute. The course is constant. Aggravating factors: none. Alleviating factors: none.        Past Medical History:  Diagnosis Date  . Anemia   . Anxiety   . Arthritis   . Degenerative disc disease, cervical   . GI bleeding   . Iron deficiency anemia   . Neuropathy   . Postherpetic neuralgia   . Shingles   . Thyroid disease     Patient Active Problem List   Diagnosis Date Noted  . Acute blood loss anemia 04/17/2017  . GI bleed 04/16/2017  . GLAUCOMA 04/17/2008  . COLONIC POLYPS 04/12/2008  . CONSTIPATION 04/12/2008  . BRONCHITIS, RECURRENT 09/01/2007  . NECK AND BACK PAIN 09/01/2007  . Anxiety state 06/28/2007  . HIATAL HERNIA 06/28/2007  . DEGENERATIVE JOINT DISEASE 06/28/2007  . SCOLIOSIS 06/28/2007  . ANEMIA, HX OF 06/28/2007    Past Surgical History:  Procedure Laterality Date  . ABDOMINAL HYSTERECTOMY    . APPENDECTOMY    . BACK SURGERY    . ESOPHAGOGASTRODUODENOSCOPY N/A 04/17/2017   Procedure: ESOPHAGOGASTRODUODENOSCOPY (EGD);  Surgeon: Carol Ada, MD;  Location: [facility];  Service:  Endoscopy;  Laterality: N/A;  . TONSILLECTOMY      OB History    No data available       Home Medications    Prior to Admission medications   Medication Sig Start Date End Date Taking? Authorizing Provider  albuterol (PROVENTIL HFA;VENTOLIN HFA) 108 (90 Base) MCG/ACT inhaler Inhale 1-2 puffs into the lungs every 6 (six) hours as needed for wheezing or shortness of breath.     [provider]  ALPRAZolam Duanne Moron) 0.5 MG tablet Take 0.5 mg by mouth 2 (two) times daily.    [provider]  dorzolamide-timolol (COSOPT) 22.3-6.8 MG/ML ophthalmic solution Place 1 drop into both eyes 2 (two) times daily.     [provider]  furosemide (LASIX) 20 MG tablet Take 20 mg by mouth daily as needed for edema.     [provider]  gabapentin (NEURONTIN) 100 MG capsule Take 100-200 mg by mouth 3 (three) times daily.     [provider]  Multiple Vitamin (MULTIVITAMIN WITH MINERALS) TABS tablet Take 1 tablet by mouth daily.    [provider]  nystatin (NYSTATIN) powder Apply 2 g topically 2 (two) times daily as needed (for irritation).    [provider]  pantoprazole (PROTONIX) 40 MG tablet Take 1 tablet (40 mg total) by mouth 2 (two) times daily. 04/17/17   Dessa Phi Chahn-Yang, DO  polyethylene glycol (MIRALAX / GLYCOLAX) packet Take 17 g by  mouth 2 (two) times daily.    [provider]  traMADol (ULTRAM) 50 MG tablet Take 50 mg by mouth every 6 (six) hours as needed for moderate pain.     [provider]  Vitamin D, Ergocalciferol, (DRISDOL) 50000 units CAPS capsule Take 50,000 Units by mouth every 14 (fourteen) days.    [provider]    Family History Family History  Problem Relation Age of Onset  . Colon cancer Sister   . Heart attack Sister     Social History Social History  Substance Use Topics  . Smoking status: Never Smoker  . Smokeless tobacco: Never Used  . Alcohol use No      Allergies   Ampicillin; Codeine; and Penicillins   Review of Systems Review of Systems  Constitutional: Negative for fever.  HENT: Negative for congestion, dental problem, rhinorrhea and sinus pressure.   Eyes: Positive for visual disturbance. Negative for photophobia, discharge and redness.  Respiratory: Negative for shortness of breath.   Cardiovascular: Negative for chest pain.  Gastrointestinal: Negative for nausea and vomiting.  Musculoskeletal: Negative for gait problem, neck pain and neck stiffness.  Skin: Negative for rash.  Neurological: Positive for numbness and headaches. Negative for syncope, speech difficulty, weakness and light-headedness.  Psychiatric/Behavioral: Negative for confusion.     Physical Exam Updated Vital Signs BP (!) 173/87   Pulse 94   Temp 98.3 F (36.8 C) (Oral)   Resp 20   SpO2 97%   Physical Exam  Constitutional: She is oriented to person, place, and time. She appears well-developed and well-nourished.  HENT:  Head: Normocephalic and atraumatic.  Right Ear: Tympanic membrane, external ear and ear canal normal.  Left Ear: Tympanic membrane, external ear and ear canal normal.  Nose: Nose normal.  Mouth/Throat: Uvula is midline, oropharynx is clear and moist and mucous membranes are normal.  Eyes: Pupils are equal, round, and reactive to light. Conjunctivae, EOM and lids are normal. Right eye exhibits no nystagmus. Left eye exhibits no nystagmus.  Neck: Normal range of motion. Neck supple.  Cardiovascular: Normal rate and regular rhythm.   Pulmonary/Chest: Effort normal and breath sounds normal.  Abdominal: Soft. There is no tenderness.  Musculoskeletal:       Cervical back: She exhibits normal range of motion, no tenderness and no bony tenderness.  Neurological: She is alert and oriented to person, place, and time. She has normal strength and normal reflexes. A cranial nerve deficit (visual field change) is present. No sensory  deficit. She displays a negative Romberg sign. Coordination and gait normal. GCS eye subscore is 4. GCS verbal subscore is 5. GCS motor subscore is 6.  Normal finger-to-nose. No truncal ataxia.   Skin: Skin is warm and dry.  Psychiatric: She has a normal mood and affect.  Nursing note and vitals reviewed.    ED Treatments / Results  Labs (all labs ordered are listed, but only abnormal results are displayed) Labs Reviewed  CBC - Abnormal; Notable for the following:       Result Value   RBC 3.78 (*)    Hemoglobin 10.1 (*)    HCT 32.5 (*)    RDW 20.0 (*)    All other components within normal limits  COMPREHENSIVE METABOLIC PANEL - Abnormal; Notable for the following:    Sodium 130 (*)    Chloride 93 (*)    Total Protein 6.4 (*)    All other components within normal limits  I-STAT CHEM 8, ED - Abnormal;  Notable for the following:    Sodium 129 (*)    Chloride 90 (*)    Hemoglobin 11.9 (*)    HCT 35.0 (*)    All other components within normal limits  PROTIME-INR  APTT  DIFFERENTIAL  HEMOGLOBIN A1C  LIPID PANEL  BASIC METABOLIC PANEL  CBC  I-STAT TROPONIN, ED    EKG  EKG Interpretation  Date/Time:  Monday April 27 2017 17:26:58 EDT Ventricular Rate:  84 PR Interval:  256 QRS Duration: 80 QT Interval:  340 QTC Calculation: 401 R Axis:   27 Text Interpretation:  Sinus rhythm with 1st degree A-V block Possible Left atrial enlargement Borderline ECG No significant change since last tracing Confirmed by Deno Etienne [phone]) on 04/27/2017 8:48:38 PM       Radiology Ct Head Wo Contrast  Result Date: 04/27/2017 CLINICAL DATA:  Focal neurologic deficit greater than 6 hours. Stroke suspected. Blurry vision and right-sided weakness. Unsteady gait. EXAM: CT HEAD WITHOUT CONTRAST TECHNIQUE: Contiguous axial images were obtained from the base of the skull through the vertex without intravenous contrast. COMPARISON:  None. FINDINGS: Brain: Small ill-defined low-density within the  right cerebellum may be subacute ischemia. No intracranial hemorrhage. No subdural or extra-axial fluid collection. No hydrocephalus, mass effect or midline shift. Age related atrophy. Mild chronic small vessel ischemia. Vascular: Atherosclerosis of skullbase vasculature without hyperdense vessel or abnormal calcification. Skull: No focal lesion or skull fracture. Sinuses/Orbits: Paranasal sinuses and mastoid air cells are clear. The visualized orbits are unremarkable. Other: None. IMPRESSION: 1. Suspect focus of subacute ischemia in the right cerebellum. No definite associated mass effect. No hemorrhage. Consider MRI for further evaluation. 2. Age related atrophy.  Mild chronic small vessel ischemia. Electronically Signed   By: Jeb Levering M.D.   On: 04/27/2017 20:20    Procedures Procedures (including critical care time)  Medications Ordered in ED Medications  fentaNYL (SUBLIMAZE) injection 50 mcg (not administered)  albuterol (PROVENTIL HFA;VENTOLIN HFA) 108 (90 Base) MCG/ACT inhaler 1-2 puff (not administered)  ALPRAZolam (XANAX) tablet 0.5 mg (not administered)  dorzolamide-timolol (COSOPT) 22.3-6.8 MG/ML ophthalmic solution 1 drop (not administered)  gabapentin (NEURONTIN) capsule 100-200 mg (not administered)  multivitamin with minerals tablet 1 tablet (not administered)  pantoprazole (PROTONIX) EC tablet 40 mg (not administered)  polyethylene glycol (MIRALAX / GLYCOLAX) packet 17 g (not administered)  traMADol (ULTRAM) tablet 50 mg (not administered)   stroke: mapping our early stages of recovery book (not administered)  acetaminophen (TYLENOL) tablet 650 mg (not administered)    Or  acetaminophen (TYLENOL) solution 650 mg (not administered)    Or  acetaminophen (TYLENOL) suppository 650 mg (not administered)  senna-docusate (Senokot-S) tablet 1 tablet (not administered)  enoxaparin (LOVENOX) injection 40 mg (not administered)  0.9 %  sodium chloride infusion (not administered)      Initial Impression / Assessment and Plan / ED Course  I have reviewed the triage vital signs and the nursing notes.  Pertinent labs & imaging results that were available during my care of the patient were reviewed by me and considered in my medical decision making (see chart for details).     Patient seen and examined. Work-up initiated. Medications ordered. Discussed with Dr. Tyrone Nine who has seen.   Vital signs reviewed and are as follows: BP 101/79   Pulse 76   Temp 98.3 F (36.8 C) (Oral)   Resp (!) 32   SpO2 97%   Spoke with neurology who will consult on patient.  Spoke with Dr. Myna Hidalgo  who will admit.  Final Clinical Impressions(s) / ED Diagnoses   Final diagnoses:  Ischemic stroke ([place])   Admit.   New Prescriptions New Prescriptions   No medications on file     Carlisle Cater, Hershal Coria 04/27/17 2144    Deno Etienne, DO 04/27/17 2316

## 2017-04-27 NOTE — Consult Note (Signed)
Referring Physician: Dr. Tyrone Nine    Chief Complaint: Right sided numbness and headache  HPI: Jeanne Cruz is an 83 y.o. female who presents with headache and numbness on the right side of her body since yesterday afternoon. She awoke this morning with red and blue circles in her vision. Her symptoms have been persistent since onset. She went to her eye doctor this afternoon; per report, a visual field cut was suspected and she was sent to the ED for stroke work up.  CT in the ED reveals a focus of subacute ischemia in the right cerebellum. No definite associated mass effect. No hemorrhage. Age related atrophy.  Mild chronic small vessel ischemia.  She has a past medical history significant for ventral hernia and remote GI bleed (per patient last episode may have been in 2016). She has required blood transfusion for anemia secondary to GIB in the past. She recently was admitted on 8/3 for fatigue and 1 week of melena. Of note, she does not use NSAIDs or anticoagulants, takes only baby aspirin.  Past Medical History:  Diagnosis Date  . Anemia   . Anxiety   . Arthritis   . Degenerative disc disease, cervical   . GI bleeding   . Iron deficiency anemia   . Neuropathy   . Postherpetic neuralgia   . Shingles   . Thyroid disease     Past Surgical History:  Procedure Laterality Date  . ABDOMINAL HYSTERECTOMY    . APPENDECTOMY    . BACK SURGERY    . ESOPHAGOGASTRODUODENOSCOPY N/A 04/17/2017   Procedure: ESOPHAGOGASTRODUODENOSCOPY (EGD);  Surgeon: Carol Ada, MD;  Location: [facility];  Service: Endoscopy;  Laterality: N/A;  . TONSILLECTOMY      Family History  Problem Relation Age of Onset  . Colon cancer Sister   . Heart attack Sister    Social History:  reports that she has never smoked. She has never used smokeless tobacco. She reports that she does not drink alcohol or use drugs.  Allergies:  Allergies  Allergen Reactions  . Ampicillin Diarrhea and Other (See Comments)     Has patient had a PCN reaction causing immediate rash, facial/tongue/throat swelling, SOB or lightheadedness with hypotension: No Has patient had a PCN reaction causing severe rash involving mucus membranes or skin necrosis: No Has patient had a PCN reaction that required hospitalization: No Has patient had a PCN reaction occurring within the last 10 years: No If all of the above answers are "NO", then may proceed with Cephalosporin use.  . Codeine Other (See Comments)    Reaction:  Somnolence   . Penicillins Diarrhea and Other (See Comments)    Has patient had a PCN reaction causing immediate rash, facial/tongue/throat swelling, SOB or lightheadedness with hypotension: No Has patient had a PCN reaction causing severe rash involving mucus membranes or skin necrosis: No Has patient had a PCN reaction that required hospitalization: No Has patient had a PCN reaction occurring within the last 10 years: No If all of the above answers are "NO", then may proceed with Cephalosporin use.    Medications:  Prior to Admission:  Prescriptions Prior to Admission  Medication Sig Dispense Refill Last Dose  . bimatoprost (LUMIGAN) 0.03 % ophthalmic solution 1 drop at bedtime.     Marland Kitchen albuterol (PROVENTIL HFA;VENTOLIN HFA) 108 (90 Base) MCG/ACT inhaler Inhale 1-2 puffs into the lungs every 6 (six) hours as needed for wheezing or shortness of breath.    Past Week at Unknown time  .  ALPRAZolam (XANAX) 0.5 MG tablet Take 0.5 mg by mouth 2 (two) times daily.   04/16/2017 at Unknown time  . dorzolamide-timolol (COSOPT) 22.3-6.8 MG/ML ophthalmic solution Place 1 drop into both eyes 2 (two) times daily.    04/16/2017 at Unknown time  . furosemide (LASIX) 20 MG tablet Take 20 mg by mouth daily as needed for edema.    Past Week at Unknown time  . gabapentin (NEURONTIN) 100 MG capsule Take 100-200 mg by mouth 3 (three) times daily.    04/16/2017 at Unknown time  . Multiple Vitamin (MULTIVITAMIN WITH MINERALS) TABS tablet Take  1 tablet by mouth daily.   04/16/2017 at Unknown time  . nystatin (NYSTATIN) powder Apply 2 g topically 2 (two) times daily as needed (for irritation).   Past Week at Unknown time  . pantoprazole (PROTONIX) 40 MG tablet Take 1 tablet (40 mg total) by mouth 2 (two) times daily. 60 tablet 0   . polyethylene glycol (MIRALAX / GLYCOLAX) packet Take 17 g by mouth 2 (two) times daily.   04/16/2017 at Unknown time  . traMADol (ULTRAM) 50 MG tablet Take 50 mg by mouth every 6 (six) hours as needed for moderate pain.    04/16/2017 at 2030  . Vitamin D, Ergocalciferol, (DRISDOL) 50000 units CAPS capsule Take 50,000 Units by mouth every 14 (fourteen) days.   04/08/2017 at unknown   Scheduled: . ALPRAZolam  0.5 mg Oral BID  . dorzolamide-timolol  1 drop Both Eyes BID  . enoxaparin (LOVENOX) injection  20 mg Subcutaneous Q24H  . gabapentin  100 mg Oral BID AC  . gabapentin  200 mg Oral QHS  . multivitamin with minerals  1 tablet Oral Daily  . pantoprazole  40 mg Oral BID  . polyethylene glycol  17 g Oral BID   Continuous: . sodium chloride 100 mL/hr at 04/28/17 0021   [id number] **OR** acetaminophen (TYLENOL) oral liquid 160 mg/5 mL **OR** acetaminophen, albuterol, senna-docusate, traMADol  ROS: As per HPI.   Physical Examination: Blood pressure 101/79, pulse 76, temperature 98.3 F (36.8 C), temperature source Oral, resp. rate (!) 32, SpO2 97 %.  HEENT: [place]/AT Lungs: Respirations unlabored Ext: Warm and well perfused  Neurologic Examination: Mental Status: Alert, oriented, thought content appropriate.  Speech fluent without evidence of aphasia.  Able to follow all commands without difficulty. Cranial Nerves: II:  Visual fields intact. PERRL.  III,IV, VI: ptosis not present, EOMI without nystagmus V,VII: smile symmetric, facial temp sensation normal bilaterally VIII: hearing intact to voice IX,X: no hypophonia XI: symmetric XII: no lingual dysarthria Motor: Right : Upper extremity    4/5    Left:     Upper extremity   5/5  Lower extremity   5/5     Lower extremity   5/5 Sensory: Temp and light touch intact throughout, bilaterally. No extinction Deep Tendon Reflexes:  Unremarkable Plantars: Right: downgoing   Left: downgoing Cerebellar: No ataxia with FNF or heel-shin bilaterally Gait: Deferred   Results for orders placed or performed during the hospital encounter of 04/27/17 (from the past 48 hour(s))  Protime-INR     Status: None   Collection Time: 04/27/17  5:29 PM  Result Value Ref Range   Prothrombin Time 13.4 11.4 - 15.2 seconds   INR 1.02   APTT     Status: None   Collection Time: 04/27/17  5:29 PM  Result Value Ref Range   aPTT 34 24 - 36 seconds  CBC     Status: Abnormal  Collection Time: 04/27/17  5:29 PM  Result Value Ref Range   WBC 5.9 4.0 - 10.5 K/uL   RBC 3.78 (L) 3.87 - 5.11 MIL/uL   Hemoglobin 10.1 (L) 12.0 - 15.0 g/dL   HCT 32.5 (L) 36.0 - 46.0 %   MCV 86.0 78.0 - 100.0 fL   MCH 26.7 26.0 - 34.0 pg   MCHC 31.1 30.0 - 36.0 g/dL   RDW 20.0 (H) 11.5 - 15.5 %   Platelets 237 150 - 400 K/uL  Differential     Status: None   Collection Time: 04/27/17  5:29 PM  Result Value Ref Range   Neutrophils Relative % 73 %   Neutro Abs 4.3 1.7 - 7.7 K/uL   Lymphocytes Relative 15 %   Lymphs Abs 0.9 0.7 - 4.0 K/uL   Monocytes Relative 11 %   Monocytes Absolute 0.6 0.1 - 1.0 K/uL   Eosinophils Relative 1 %   Eosinophils Absolute 0.1 0.0 - 0.7 K/uL   Basophils Relative 0 %   Basophils Absolute 0.0 0.0 - 0.1 K/uL  Comprehensive metabolic panel     Status: Abnormal   Collection Time: 04/27/17  5:29 PM  Result Value Ref Range   Sodium 130 (L) 135 - 145 mmol/L   Potassium 4.0 3.5 - 5.1 mmol/L   Chloride 93 (L) 101 - 111 mmol/L   CO2 28 22 - 32 mmol/L   Glucose, Bld 92 65 - 99 mg/dL   BUN 10 6 - 20 mg/dL   Creatinine, Ser 0.51 0.44 - 1.00 mg/dL   Calcium 9.2 8.9 - 10.3 mg/dL   Total Protein 6.4 (L) 6.5 - 8.1 g/dL   Albumin 3.8 3.5 - 5.0 g/dL    AST 24 15 - 41 U/L   ALT 16 14 - 54 U/L   Alkaline Phosphatase 69 38 - 126 U/L   Total Bilirubin 0.5 0.3 - 1.2 mg/dL   GFR calc non Af Amer >60 >60 mL/min   GFR calc Af Amer >60 >60 mL/min    Comment: (NOTE) The eGFR has been calculated using the CKD EPI equation. This calculation has not been validated in all clinical situations. eGFR's persistently <60 mL/min signify possible Chronic Kidney Disease.    Anion gap 9 5 - 15  I-stat troponin, ED     Status: None   Collection Time: 04/27/17  5:41 PM  Result Value Ref Range   Troponin i, poc 0.01 0.00 - 0.08 ng/mL   Comment 3            Comment: Due to the release kinetics of cTnI, a negative result within the first hours of the onset of symptoms does not rule out myocardial infarction with certainty. If myocardial infarction is still suspected, repeat the test at appropriate intervals.   I-Stat Chem 8, ED     Status: Abnormal   Collection Time: 04/27/17  5:42 PM  Result Value Ref Range   Sodium 129 (L) 135 - 145 mmol/L   Potassium 4.0 3.5 - 5.1 mmol/L   Chloride 90 (L) 101 - 111 mmol/L   BUN 11 6 - 20 mg/dL   Creatinine, Ser 0.60 0.44 - 1.00 mg/dL   Glucose, Bld 89 65 - 99 mg/dL   Calcium, Ion 1.15 1.15 - 1.40 mmol/L   TCO2 28 0 - 100 mmol/L   Hemoglobin 11.9 (L) 12.0 - 15.0 g/dL   HCT 35.0 (L) 36.0 - 46.0 %   Ct Head Wo Contrast  Result Date: 04/27/2017  CLINICAL DATA:  Focal neurologic deficit greater than 6 hours. Stroke suspected. Blurry vision and right-sided weakness. Unsteady gait. EXAM: CT HEAD WITHOUT CONTRAST TECHNIQUE: Contiguous axial images were obtained from the base of the skull through the vertex without intravenous contrast. COMPARISON:  None. FINDINGS: Brain: Small ill-defined low-density within the right cerebellum may be subacute ischemia. No intracranial hemorrhage. No subdural or extra-axial fluid collection. No hydrocephalus, mass effect or midline shift. Age related atrophy. Mild chronic small vessel  ischemia. Vascular: Atherosclerosis of skullbase vasculature without hyperdense vessel or abnormal calcification. Skull: No focal lesion or skull fracture. Sinuses/Orbits: Paranasal sinuses and mastoid air cells are clear. The visualized orbits are unremarkable. Other: None. IMPRESSION: 1. Suspect focus of subacute ischemia in the right cerebellum. No definite associated mass effect. No hemorrhage. Consider MRI for further evaluation. 2. Age related atrophy.  Mild chronic small vessel ischemia. Electronically Signed   By: Jeb Levering M.D.   On: 04/27/2017 20:20    Assessment: 83 y.o. female with acute onset of right sided weakness.  1. CT head reveals a small subacute ischemic infarction within the right cerebellar hemisphere 2. Classifiable as having failed ASA monotherapy  Plan: 1. HgbA1c, fasting lipid panel 2. MRI, MRA of the brain without contrast 3. PT consult, OT consult, Speech consult 4. Echocardiogram 5. Carotid dopplers 6. Prophylactic therapy- switch ASA to Plavix 7. In the context of advanced age, benefits of statin most likely outweighed by risks 8. Telemetry monitoring 9. Frequent neuro checks 10. BP management   @Electronically  signed: Dr. Kerney Elbe 04/27/2017, 9:12 PM

## 2017-04-27 NOTE — H&P (Signed)
History and Physical    Jeanne Cruz YNW:[id number] DOB: [birth year] DOA: 04/27/2017  PCP: [facility] [place]   Patient coming from: Home  Chief Complaint: Headache, right-sided numbness   HPI: Jeanne Cruz is a 83 y.o. female with medical history significant for anxiety and iron deficiency anemia, now presenting to the emergency department with right-sided numbness and headache. She reports that she had been in her usual state until developing numbness involving the right side of her body yesterday. Symptoms persisted unchanged throughout the day and she also noted a mild constant headache. She woke this morning with a visual disturbance, describing red and blue lines in her visual field. She was able to see her ophthalmologist today for evaluation of this and was directed to the ED with concern for possible stroke. She denies any recent fall or trauma, denies alcohol or illicit drug use. And denies any history of similar symptoms. She had been admitted to the hospital earlier this month with fatigue and melena, found to have gastritis, was given an iron infusion, and discharged with twice daily Protonix. No melena or hematochezia since discharge and her fatigue has improved.   ED Course: Upon arrival to the ED, patient is found to be afebrile, saturating well on room air, and with vital signs stable. EKG features a sinus rhythm with first-degree AV nodal block. Noncontrast head CT is notable for a suspected focus of subacute ischemia in the right cerebellum. Chemistry panel reveals a sodium of 130 and CBC features a normocytic anemia with hemoglobin of 10.1, improved from 9.0 earlier this month. INR is within normal limits and so was troponin. Neurology was consulted by the ED physician and advised for a medical admission. Patient remained hemodynamically stable and in no apparent respiratory distress and will be observed on the telemetry unit for ongoing evaluation and management of  headache and right-sided numbness suspect secondary to acute ischemic stroke.  Review of Systems:  All other systems reviewed and apart from HPI, are negative.  Past Medical History:  Diagnosis Date  . Anemia   . Anxiety   . Arthritis   . Degenerative disc disease, cervical   . GI bleeding   . Iron deficiency anemia   . Neuropathy   . Postherpetic neuralgia   . Shingles   . Thyroid disease     Past Surgical History:  Procedure Laterality Date  . ABDOMINAL HYSTERECTOMY    . APPENDECTOMY    . BACK SURGERY    . ESOPHAGOGASTRODUODENOSCOPY N/A 04/17/2017   Procedure: ESOPHAGOGASTRODUODENOSCOPY (EGD);  Surgeon: Carol Ada, MD;  Location: [facility];  Service: Endoscopy;  Laterality: N/A;  . TONSILLECTOMY       reports that she has never smoked. She has never used smokeless tobacco. She reports that she does not drink alcohol or use drugs.  Allergies  Allergen Reactions  . Ampicillin Diarrhea and Other (See Comments)    Has patient had a PCN reaction causing immediate rash, facial/tongue/throat swelling, SOB or lightheadedness with hypotension: No Has patient had a PCN reaction causing severe rash involving mucus membranes or skin necrosis: No Has patient had a PCN reaction that required hospitalization: No Has patient had a PCN reaction occurring within the last 10 years: No If all of the above answers are "NO", then may proceed with Cephalosporin use.  . Codeine Other (See Comments)    Reaction:  Somnolence   . Penicillins Diarrhea and Other (See Comments)    Has patient had a PCN reaction causing  immediate rash, facial/tongue/throat swelling, SOB or lightheadedness with hypotension: No Has patient had a PCN reaction causing severe rash involving mucus membranes or skin necrosis: No Has patient had a PCN reaction that required hospitalization: No Has patient had a PCN reaction occurring within the last 10 years: No If all of the above answers are "NO", then may proceed  with Cephalosporin use.    Family History  Problem Relation Age of Onset  . Colon cancer Sister   . Heart attack Sister      Prior to Admission medications   Medication Sig Start Date End Date Taking? Authorizing Provider  albuterol (PROVENTIL HFA;VENTOLIN HFA) 108 (90 Base) MCG/ACT inhaler Inhale 1-2 puffs into the lungs every 6 (six) hours as needed for wheezing or shortness of breath.     [provider]  ALPRAZolam Duanne Moron) 0.5 MG tablet Take 0.5 mg by mouth 2 (two) times daily.    [provider]  dorzolamide-timolol (COSOPT) 22.3-6.8 MG/ML ophthalmic solution Place 1 drop into both eyes 2 (two) times daily.     [provider]  furosemide (LASIX) 20 MG tablet Take 20 mg by mouth daily as needed for edema.     [provider]  gabapentin (NEURONTIN) 100 MG capsule Take 100-200 mg by mouth 3 (three) times daily.     [provider]  Multiple Vitamin (MULTIVITAMIN WITH MINERALS) TABS tablet Take 1 tablet by mouth daily.    [provider]  nystatin (NYSTATIN) powder Apply 2 g topically 2 (two) times daily as needed (for irritation).    [provider]  pantoprazole (PROTONIX) 40 MG tablet Take 1 tablet (40 mg total) by mouth 2 (two) times daily. 04/17/17   Dessa Phi Chahn-Yang, DO  polyethylene glycol (MIRALAX / GLYCOLAX) packet Take 17 g by mouth 2 (two) times daily.    [provider]  traMADol (ULTRAM) 50 MG tablet Take 50 mg by mouth every 6 (six) hours as needed for moderate pain.     [provider]  Vitamin D, Ergocalciferol, (DRISDOL) 50000 units CAPS capsule Take 50,000 Units by mouth every 14 (fourteen) days.    [provider]    Physical Exam: Vitals:   04/27/17 1710 04/27/17 1944 04/27/17 2030 04/27/17 2101  BP: 134/86 124/81 (!) 173/87 101/79  Pulse: 80 94  76  Resp: 16 16 20  (!) 32  Temp: 98.2 F (36.8 C) 98.3 F (36.8 C)    TempSrc: Oral Oral    SpO2: 97%   97%       Constitutional: NAD, calm, comfortable Eyes: PERTLA, lids and conjunctivae normal ENMT: Mucous membranes are moist. Posterior pharynx clear of any exudate or lesions.   Neck: normal, supple, no masses, no thyromegaly Respiratory: clear to auscultation bilaterally, no wheezing, no crackles. Normal respiratory effort.  Cardiovascular: S1 & S2 heard, regular rate and rhythm. No significant JVD. Abdomen: No distension, no tenderness, no masses palpated. Bowel sounds normal.  Musculoskeletal: no clubbing / cyanosis. No joint deformity upper and lower extremities.   Skin: no significant rashes, lesions, ulcers. Warm, dry, well-perfused. Neurologic: CN 2-12 grossly intact. Sensation intact, DTR normal. Strength 5/5 in all 4 limbs.  Psychiatric: Alert and oriented x 3. Calm and cooperative.     Labs on Admission: I have personally reviewed following labs and imaging studies  CBC:  Recent Labs Lab 04/27/17 1729 04/27/17 1742  WBC 5.9  --   NEUTROABS 4.3  --   HGB 10.1* 11.9*  HCT 32.5* 35.0*  MCV 86.0  --   PLT 237  --    Basic Metabolic Panel:  Recent Labs Lab 04/27/17 1729 04/27/17 1742  NA 130* 129*  K 4.0 4.0  CL 93* 90*  CO2 28  --   GLUCOSE 92 89  BUN 10 11  CREATININE 0.51 0.60  CALCIUM 9.2  --    GFR: Estimated Creatinine Clearance: 31.9 mL/min (by C-G formula based on SCr of 0.6 mg/dL). Liver Function Tests:  Recent Labs Lab 04/27/17 1729  AST 24  ALT 16  ALKPHOS 69  BILITOT 0.5  PROT 6.4*  ALBUMIN 3.8   No results for input(s): LIPASE, AMYLASE in the last 168 hours. No results for input(s): AMMONIA in the last 168 hours. Coagulation Profile:  Recent Labs Lab 04/27/17 1729  INR 1.02   Cardiac Enzymes: No results for input(s): CKTOTAL, CKMB, CKMBINDEX, TROPONINI in the last 168 hours. BNP (last 3 results) No results for input(s): PROBNP in the last 8760 hours. HbA1C: No results for input(s): HGBA1C in the last 72 hours. CBG: No  results for input(s): GLUCAP in the last 168 hours. Lipid Profile: No results for input(s): CHOL, HDL, LDLCALC, TRIG, CHOLHDL, LDLDIRECT in the last 72 hours. Thyroid Function Tests: No results for input(s): TSH, T4TOTAL, FREET4, T3FREE, THYROIDAB in the last 72 hours. Anemia Panel: No results for input(s): VITAMINB12, FOLATE, FERRITIN, TIBC, IRON, RETICCTPCT in the last 72 hours. Urine analysis:    Component Value Date/Time   COLORURINE YELLOW 04/16/2017 [place] 04/16/2017 1637   LABSPEC 1.009 04/16/2017 1637   PHURINE 7.5 04/16/2017 1637   GLUCOSEU NEGATIVE 04/16/2017 1637   HGBUR NEGATIVE 04/16/2017 1637   BILIRUBINUR NEGATIVE 04/16/2017 1637   KETONESUR NEGATIVE 04/16/2017 1637   PROTEINUR NEGATIVE 04/16/2017 1637   NITRITE NEGATIVE 04/16/2017 1637   LEUKOCYTESUR NEGATIVE 04/16/2017 1637   Sepsis Labs: @LABRCNTIP (procalcitonin:4,lacticidven:4) )No results found for this or any previous visit (from the past 240 hour(s)).   Radiological Exams on Admission: Ct Head Wo Contrast  Result Date: 04/27/2017 CLINICAL DATA:  Focal neurologic deficit greater than 6 hours. Stroke suspected. Blurry vision and right-sided weakness. Unsteady gait. EXAM: CT HEAD WITHOUT CONTRAST TECHNIQUE: Contiguous axial images were obtained from the base of the skull through the vertex without intravenous contrast. COMPARISON:  None. FINDINGS: Brain: Small ill-defined low-density within the right cerebellum may be subacute ischemia. No intracranial hemorrhage. No subdural or extra-axial fluid collection. No hydrocephalus, mass effect or midline shift. Age related atrophy. Mild chronic small vessel ischemia. Vascular: Atherosclerosis of skullbase vasculature without hyperdense vessel or abnormal calcification. Skull: No focal lesion or skull fracture. Sinuses/Orbits: Paranasal sinuses and mastoid air cells are clear. The visualized orbits are unremarkable. Other: None. IMPRESSION: 1. Suspect focus  of subacute ischemia in the right cerebellum. No definite associated mass effect. No hemorrhage. Consider MRI for further evaluation. 2. Age related atrophy.  Mild chronic small vessel ischemia. Electronically Signed   By: Jeb Levering M.D.   On: 04/27/2017 20:20    EKG: Independently reviewed. Sinus rhythm, 1st degree AV block.   Assessment/Plan  1. Ischemic stroke  - Pt presents with headachy and right-sided numbness that developed yesterday, and then visual disturbance that she woke with this am  - Head CT with suspected focus of subacute ischemia in right cerebellum  - Neurology is consulting and much appreciated  - Plan to monitor on telemetry with frequent neuro checks, PT/OT/SLP evals, obtain MRI brain, MRA head, carotid dopplers, echocardiogram, fasting lipids, and  A1c   2. Normocytic anemia  - Hgb is 10.1 on admission, increased from recent admission  - Underwent EGD earlier this month, found to have gastritis  - Continue twice-daily PPI    3. Hyponatremia - Serum sodium is 130 on admission  - Likely secondary to hypovolemia  - Providing gentle IVF hydration with NS overnight - Repeat chem panel in am    4. Anxiety  - Stable  - Continue Xanax   DVT prophylaxis: sq Lovenox Code Status: Full  Family Communication: Discussed with patient Disposition Plan: Observe on telemetry Consults called: Neurology Admission status: Observation    Vianne Bulls, MD [facility] Pager [phone]  If 7PM-7AM, please contact night-coverage [email or web address] Password [facility]  04/27/2017, 9:37 PM

## 2017-04-28 ENCOUNTER — Observation Stay (HOSPITAL_COMMUNITY): Payer: Medicare Other

## 2017-04-28 ENCOUNTER — Encounter (HOSPITAL_COMMUNITY): Payer: Self-pay

## 2017-04-28 ENCOUNTER — Observation Stay (HOSPITAL_BASED_OUTPATIENT_CLINIC_OR_DEPARTMENT_OTHER): Payer: Medicare Other

## 2017-04-28 DIAGNOSIS — I351 Nonrheumatic aortic (valve) insufficiency: Secondary | ICD-10-CM

## 2017-04-28 DIAGNOSIS — K3189 Other diseases of stomach and duodenum: Secondary | ICD-10-CM

## 2017-04-28 DIAGNOSIS — I639 Cerebral infarction, unspecified: Secondary | ICD-10-CM | POA: Diagnosis not present

## 2017-04-28 DIAGNOSIS — E871 Hypo-osmolality and hyponatremia: Secondary | ICD-10-CM | POA: Diagnosis not present

## 2017-04-28 LAB — BASIC METABOLIC PANEL
ANION GAP: 5 (ref 5–15)
BUN: 8 mg/dL (ref 6–20)
CHLORIDE: 97 mmol/L — AB (ref 101–111)
CO2: 29 mmol/L (ref 22–32)
Calcium: 8.6 mg/dL — ABNORMAL LOW (ref 8.9–10.3)
Creatinine, Ser: 0.53 mg/dL (ref 0.44–1.00)
GFR calc Af Amer: >60 mL/min (ref >60)
Glucose, Bld: 108 mg/dL — ABNORMAL HIGH (ref 65–99)
POTASSIUM: 3.7 mmol/L (ref 3.5–5.1)
SODIUM: 131 mmol/L — AB (ref 135–145)

## 2017-04-28 LAB — CBC
HEMATOCRIT: 31.7 % — AB (ref 36.0–46.0)
HEMOGLOBIN: 9.7 g/dL — AB (ref 12.0–15.0)
MCH: 26.2 pg (ref 26.0–34.0)
MCHC: 30.6 g/dL (ref 30.0–36.0)
MCV: 85.7 fL (ref 78.0–100.0)
Platelets: 227 10*3/uL (ref 150–400)
RBC: 3.7 MIL/uL — AB (ref 3.87–5.11)
RDW: 20.1 % — AB (ref 11.5–15.5)
WBC: 4 10*3/uL (ref 4.0–10.5)

## 2017-04-28 LAB — LIPID PANEL
Cholesterol: 165 mg/dL (ref 0–200)
HDL: 72 mg/dL (ref >40)
LDL CALC: 82 mg/dL (ref 0–99)
Total CHOL/HDL Ratio: 2.3 RATIO
Triglycerides: 53 mg/dL (ref <150)
VLDL: 11 mg/dL (ref 0–40)

## 2017-04-28 LAB — ECHOCARDIOGRAM COMPLETE
Height: 59 in
WEIGHTICAEL: 1414.4 [oz_av]

## 2017-04-28 MED ORDER — ATORVASTATIN CALCIUM 20 MG PO TABS: 20.0000 mg | ORAL_TABLET | Freq: Every day | ORAL | 0 refills | Status: AC

## 2017-04-28 MED ORDER — ATORVASTATIN CALCIUM 10 MG PO TABS
20.0000 mg | ORAL_TABLET | Freq: Every day | ORAL | Status: DC
  Administered 2017-04-28: 20 mg via ORAL
  Filled 2017-04-28: qty 2

## 2017-04-28 MED ORDER — IOPAMIDOL (ISOVUE-370) INJECTION 76%
INTRAVENOUS | Status: AC
  Administered 2017-04-28: 50 mL
  Filled 2017-04-28: qty 50

## 2017-04-28 MED ORDER — ASPIRIN EC 81 MG PO TBEC: 81.0000 mg | DELAYED_RELEASE_TABLET | Freq: Every day | ORAL | 2 refills | Status: AC

## 2017-04-28 NOTE — Discharge Summary (Signed)
Physician Discharge Summary  RITAL CAVEY GXQ:[id number] DOB: Jan 17, 1928 DOA: 04/27/2017  PCP: [facility]  Admit date: 04/27/2017 Discharge date: 04/28/2017  Admitted From: home Disposition:  home   Recommendations for Outpatient Follow-up:  1. F/u with neuro  Discharge Condition:  stable   CODE STATUS:  Full code   Consultations:  neurology    Discharge Diagnoses:  Principal Problem:   Ischemic stroke ([place]) Active Problems:   Erosive gastropathy   Anxiety state   ANEMIA, HX OF   Hyponatremia    Subjective: Only saw marbles this AM for a little while today. No further neurological symptoms. No  Numbness or tingling or focal weakness.   Brief Summary: Jeanne Cruz is a 83 y.o. female with medical history significant for anxiety, recent admission for melena/ Iron deficiency anemia who was taken off of ASA about 2 wks ago. She was also on Celebrex for neck pain  She present because she was "seeing marbles". She went to be evaluated by an eye doctor and was told that her eyes were fine and she needed to come to the ER for an eval. Per admitted H and P, she also had numbness in right side of her body but she denies this to me.  Noncontrast head CT is notable for a suspected focus of subacute ischemia in the right cerebellum   Hospital Course:  CVA, ischemic: MRI >>Small acute/early subacute infarctions within the right inferolateral cerebellar hemisphere and right occipital lobe cortex. No hemorrhage. 2. Moderate chronic microvascular ischemic changes and moderate parenchymal volume loss of the brain. Multiple chronic lacunar infarcts within the cerebellum.  - CTA neck Beaded irregularity of the distal internal carotid arteries and distal vertebral arteries consistent with fibromuscular dysplasia. No evidence of flow limiting stenosis or measurable pseudo aneurysm. This could be a source of embolic disease - LDL 82, goal < 70-  atorvastatin 20mg  PO  daily - PT eval>> no follow up needed - A1c pending 2 D ECHO : no cardiac thrombus- see report below - visual changes are resolving - neuro feels this is an embolic infarct and would like to order a loop recorder but patient is not sure that she wants to be on anticoagulation - she will need to be resumed on at least ASA 81 daily- can continue Protonix for erosive gastropathy- d/w Dr Benson [place]  Discharge Instructions  Discharge Instructions    Diet - low sodium heart healthy    Complete by:  As directed    Increase activity slowly    Complete by:  As directed      Allergies as of 04/28/2017      Reactions   Ampicillin Diarrhea, Other (See Comments)   Has patient had a PCN reaction causing immediate rash, facial/tongue/throat swelling, SOB or lightheadedness with hypotension: No Has patient had a PCN reaction causing severe rash involving mucus membranes or skin necrosis: No Has patient had a PCN reaction that required hospitalization: No Has patient had a PCN reaction occurring within the last 10 years: No If all of the above answers are "NO", then may proceed with Cephalosporin use.   Codeine Other (See Comments)   Reaction:  Somnolence    Penicillins Diarrhea, Other (See Comments)   Has patient had a PCN reaction causing immediate rash, facial/tongue/throat swelling, SOB or lightheadedness with hypotension: No Has patient had a PCN reaction causing severe rash involving mucus membranes or skin necrosis: No Has patient had a PCN reaction that required hospitalization: No Has  patient had a PCN reaction occurring within the last 10 years: No If all of the above answers are "NO", then may proceed with Cephalosporin use.      Medication List    TAKE these medications   albuterol 108 (90 Base) MCG/ACT inhaler Commonly known as:  PROVENTIL HFA;VENTOLIN HFA Inhale 1-2 puffs into the lungs every 6 (six) hours as needed for wheezing or shortness of breath.   ALPRAZolam 0.5 MG  tablet Commonly known as:  XANAX Take 0.5 mg by mouth 2 (two) times daily as needed for anxiety or sleep.   aspirin EC 81 MG tablet Take 1 tablet (81 mg total) by mouth daily.   atorvastatin 20 MG tablet Commonly known as:  LIPITOR Take 1 tablet (20 mg total) by mouth daily at 6 PM.   bimatoprost 0.03 % ophthalmic solution Commonly known as:  LUMIGAN Place 1 drop into both eyes at bedtime.   dorzolamide-timolol 22.3-6.8 MG/ML ophthalmic solution Commonly known as:  COSOPT Place 1 drop into both eyes 2 (two) times daily.   furosemide 20 MG tablet Commonly known as:  LASIX Take 20 mg by mouth daily as needed for edema.   multivitamin with minerals Tabs tablet Take 1 tablet by mouth daily.   NEURONTIN 100 MG capsule Generic drug:  gabapentin Take 100 mg by mouth every 6 (six) hours.   nystatin powder Generic drug:  nystatin Apply 2 g topically 2 (two) times daily as needed (for irritation).   pantoprazole 40 MG tablet Commonly known as:  PROTONIX Take 1 tablet (40 mg total) by mouth 2 (two) times daily.   polyethylene glycol packet Commonly known as:  MIRALAX / GLYCOLAX Take 17 g by mouth 2 (two) times daily.   traMADol 50 MG tablet Commonly known as:  ULTRAM Take 50 mg by mouth every 6 (six) hours as needed for moderate pain.   Vitamin D (Ergocalciferol) 50000 units Caps capsule Commonly known as:  DRISDOL Take 50,000 Units by mouth every 14 (fourteen) days.       Allergies  Allergen Reactions  . Ampicillin Diarrhea and Other (See Comments)    Has patient had a PCN reaction causing immediate rash, facial/tongue/throat swelling, SOB or lightheadedness with hypotension: No Has patient had a PCN reaction causing severe rash involving mucus membranes or skin necrosis: No Has patient had a PCN reaction that required hospitalization: No Has patient had a PCN reaction occurring within the last 10 years: No If all of the above answers are "NO", then may proceed with  Cephalosporin use.  . Codeine Other (See Comments)    Reaction:  Somnolence   . Penicillins Diarrhea and Other (See Comments)    Has patient had a PCN reaction causing immediate rash, facial/tongue/throat swelling, SOB or lightheadedness with hypotension: No Has patient had a PCN reaction causing severe rash involving mucus membranes or skin necrosis: No Has patient had a PCN reaction that required hospitalization: No Has patient had a PCN reaction occurring within the last 10 years: No If all of the above answers are "NO", then may proceed with Cephalosporin use.     Procedures/Studies: 2 D ECHO Left ventricle: The cavity size was normal. Systolic function was   normal. The estimated ejection fraction was in the range of 60%   to 65%. Wall motion was normal; there were no regional wall   motion abnormalities. Doppler parameters are consistent with   abnormal left ventricular relaxation (grade 1 diastolic   dysfunction). - Aortic valve: There  was mild regurgitation directed eccentrically   in the LVOT and along the septum. - Mitral valve: Calcified annulus. - Pulmonary arteries: PA peak pressure: 31 mm Hg (S).  Dg Chest 2 View  Result Date: 04/16/2017 CLINICAL DATA:  Dizziness and shortness of breath as well as rectal bleeding. History of colonic polyps, anemia, recurrent episodes of bronchitis. EXAM: CHEST  2 VIEW COMPARISON:  Report of a chest x-ray dated June 04, 2003. FINDINGS: The lungs are adequately inflated and clear. The heart is normal in size. The pulmonary vascularity is not engorged. There is tortuosity of the ascending and descending thoracic aorta. There is no pleural effusion. There is moderate dextrocurvature of the spine centered in the lower thoracic region. There is a moderate-sized hiatal hernia. IMPRESSION: There is no pneumonia, CHF, nor other acute cardiopulmonary disease. Moderate-sized hiatal hernia. Electronically Signed   By: David  Martinique M.D.   On:  04/16/2017 17:08   Ct Head Wo Contrast  Result Date: 04/27/2017 CLINICAL DATA:  Focal neurologic deficit greater than 6 hours. Stroke suspected. Blurry vision and right-sided weakness. Unsteady gait. EXAM: CT HEAD WITHOUT CONTRAST TECHNIQUE: Contiguous axial images were obtained from the base of the skull through the vertex without intravenous contrast. COMPARISON:  None. FINDINGS: Brain: Small ill-defined low-density within the right cerebellum may be subacute ischemia. No intracranial hemorrhage. No subdural or extra-axial fluid collection. No hydrocephalus, mass effect or midline shift. Age related atrophy. Mild chronic small vessel ischemia. Vascular: Atherosclerosis of skullbase vasculature without hyperdense vessel or abnormal calcification. Skull: No focal lesion or skull fracture. Sinuses/Orbits: Paranasal sinuses and mastoid air cells are clear. The visualized orbits are unremarkable. Other: None. IMPRESSION: 1. Suspect focus of subacute ischemia in the right cerebellum. No definite associated mass effect. No hemorrhage. Consider MRI for further evaluation. 2. Age related atrophy.  Mild chronic small vessel ischemia. Electronically Signed   By: Jeb Levering M.D.   On: 04/27/2017 20:20   Ct Angio Neck W Or Wo Contrast  Result Date: 04/28/2017 CLINICAL DATA:  Followup right cerebellar and right occipital infarction EXAM: CT ANGIOGRAPHY NECK TECHNIQUE: Multidetector CT imaging of the neck was performed using the standard protocol during bolus administration of intravenous contrast. Multiplanar CT image reconstructions and MIPs were obtained to evaluate the vascular anatomy. Carotid stenosis measurements (when applicable) are obtained utilizing NASCET criteria, using the distal internal carotid diameter as the denominator. CONTRAST:  50 cc Isovue 370 COMPARISON:  MRI and CT studies done yesterday. FINDINGS: Aortic arch: Aortic atherosclerosis. No aneurysm or dissection. Branching pattern of the  brachiocephalic vessels from the arch is normal without origin stenosis. Right carotid system: Common carotid artery is tortuous but widely patent to the bifurcation region. Minimal atherosclerotic plaque at the carotid bifurcation but no stenosis or irregularity. Cervical internal carotid artery is tortuous but widely patent. There is mild irregularity at the C2 level consistent with mild fibromuscular dysplasia. Some atherosclerotic calcification of the carotid siphon but no stenosis. Left carotid system: Common carotid artery is widely patent to the bifurcation. No atherosclerotic plaque at the carotid bifurcation. Cervical internal carotid artery is tortuous. There is some irregularity of the vessel at the C2 level consistent with fibromuscular dysplasia. No stenosis or pseudo aneurysm. Mild atherosclerotic calcification in the carotid siphon region. Vertebral arteries: Right vertebral artery origin is widely patent. The proximal vessel is tortuous. The vessel is widely patent through the cervical region. There is mild irregularity proximal to the foramen magnum consistent with fibromuscular dysplasia. No stenosis. Left vertebral  artery origin is widely patent. The proximal vertebral artery is tortuous but widely patent. Vertebral artery widely patent beyond that. Similar irregularity just inferior to the foramen magnum consistent with fibromuscular dysplasia. No stenosis. Both vertebral arteries give supply to the basilar. 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 Degenerative cervical spondylosis including degenerative anterolisthesis of 5 mm at C7-T1. T2-3 fusion. Other neck: No mass or lymphadenopathy. Upper chest: Apical pleural and parenchymal scarring right worse than left. 5 mm pulmonary nodule right upper lobe image 90. 6 mm pulmonary nodule right upper lobe image 111 IMPRESSION: Tortuous vessels suggesting a history of chronic hypertension. No atherosclerotic stenosis.  No sign of dissection.  Beaded irregularity of the distal internal carotid arteries and distal vertebral arteries consistent with fibromuscular dysplasia. No evidence of flow limiting stenosis or measurable pseudo aneurysm. This could be a source of embolic disease. 5 mm and 6 mm pulmonary nodules right upper lobe. Complete chest CT suggested when able. These may require follow-up. Electronically Signed   By: Nelson Chimes M.D.   On: 04/28/2017 10:04   Mr Brain Wo Contrast  Result Date: 04/27/2017 CLINICAL DATA:  83 y/o F; blurry vision and right-sided weakness. Focal neuro deficit, > 6 hrs, stroke suspected; ISCHEMIC STROKE. EXAM: MRI HEAD WITHOUT CONTRAST MRA HEAD WITHOUT CONTRAST TECHNIQUE: Multiplanar, multiecho pulse sequences of the brain and surrounding structures were obtained without intravenous contrast. Angiographic images of the head were obtained using MRA technique without contrast. COMPARISON:  04/27/2017 CT of the head. FINDINGS: MRI HEAD FINDINGS Brain: 16 mm right inferior cerebellar and 10 mm cortical right occipital foci of reduced diffusion compatible with acute/ early subacute infarction. Infarctions demonstrate increased T2 signal. No hemorrhage or significant mass effect. Multiple small chronic lacunar infarcts within the cerebellar hemispheres bilaterally. Multiple nonspecific foci of T2 FLAIR hyperintense signal abnormality in subcortical and periventricular white matter as well as the pons is compatible with Mater chronic microvascular ischemic changes for age. Moderate brain parenchymal volume loss. No hydrocephalus or extra-axial collection identified. Vascular: As below. Skull and upper cervical spine: Normal marrow signal. Sinuses/Orbits: Negative. Other: None. MRA HEAD FINDINGS Anterior circulation: No large vessel occlusion, aneurysm, or significant stenosis is identified. Prominent infundibular origins of bilateral posterior communicating arteries and inferolateral trunks. Posterior circulation: No  large vessel occlusion, aneurysm, or significant stenosis is identified. Anatomic variant: Patent anterior and bilateral posterior communicating arteries. IMPRESSION: MRI head: 1. Small acute/early subacute infarctions within the right inferolateral cerebellar hemisphere and right occipital lobe cortex. No hemorrhage. 2. Moderate chronic microvascular ischemic changes and moderate parenchymal volume loss of the brain. Multiple chronic lacunar infarcts within the cerebellum. MRA head: Patent circle of Willis. No large vessel occlusion, aneurysm, or significant stenosis is identified. Electronically Signed   By: Kristine Garbe M.D.   On: 04/27/2017 23:30   Mr Jodene Nam Head Wo Contrast  Result Date: 04/27/2017 CLINICAL DATA:  83 y/o F; blurry vision and right-sided weakness. Focal neuro deficit, > 6 hrs, stroke suspected; ISCHEMIC STROKE. EXAM: MRI HEAD WITHOUT CONTRAST MRA HEAD WITHOUT CONTRAST TECHNIQUE: Multiplanar, multiecho pulse sequences of the brain and surrounding structures were obtained without intravenous contrast. Angiographic images of the head were obtained using MRA technique without contrast. COMPARISON:  04/27/2017 CT of the head. FINDINGS: MRI HEAD FINDINGS Brain: 16 mm right inferior cerebellar and 10 mm cortical right occipital foci of reduced diffusion compatible with acute/ early subacute infarction. Infarctions demonstrate increased T2 signal. No hemorrhage or significant mass effect. Multiple small chronic lacunar infarcts within  the cerebellar hemispheres bilaterally. Multiple nonspecific foci of T2 FLAIR hyperintense signal abnormality in subcortical and periventricular white matter as well as the pons is compatible with Mater chronic microvascular ischemic changes for age. Moderate brain parenchymal volume loss. No hydrocephalus or extra-axial collection identified. Vascular: As below. Skull and upper cervical spine: Normal marrow signal. Sinuses/Orbits: Negative. Other: None. MRA  HEAD FINDINGS Anterior circulation: No large vessel occlusion, aneurysm, or significant stenosis is identified. Prominent infundibular origins of bilateral posterior communicating arteries and inferolateral trunks. Posterior circulation: No large vessel occlusion, aneurysm, or significant stenosis is identified. Anatomic variant: Patent anterior and bilateral posterior communicating arteries. IMPRESSION: MRI head: 1. Small acute/early subacute infarctions within the right inferolateral cerebellar hemisphere and right occipital lobe cortex. No hemorrhage. 2. Moderate chronic microvascular ischemic changes and moderate parenchymal volume loss of the brain. Multiple chronic lacunar infarcts within the cerebellum. MRA head: Patent circle of Willis. No large vessel occlusion, aneurysm, or significant stenosis is identified. Electronically Signed   By: Kristine Garbe M.D.   On: 04/27/2017 23:30       Discharge Exam: Vitals:   04/28/17 1138 04/28/17 1416  BP: 136/61 (!) 123/59  Pulse: 82 76  Resp: 18 20  Temp: 97.9 F (36.6 C) 98 F (36.7 C)  SpO2: 92% 94%   Vitals:   04/28/17 0858 04/28/17 0947 04/28/17 1138 04/28/17 1416  BP: 91/61 135/60 136/61 (!) 123/59  Pulse: 84 83 82 76  Resp: 16 19 18 20   Temp: 98.4 F (36.9 C) 97.9 F (36.6 C) 97.9 F (36.6 C) 98 F (36.7 C)  TempSrc: Oral Oral Oral Oral  SpO2: 94% 96% 92% 94%  Weight:      Height:        General: Pt is alert, awake, not in acute distress Cardiovascular: RRR, S1/S2 +, no rubs, no gallops Respiratory: CTA bilaterally, no wheezing, no rhonchi Abdominal: Soft, NT, ND, bowel sounds + Extremities: no edema, no cyanosis    The results of significant diagnostics from this hospitalization (including imaging, microbiology, ancillary and laboratory) are listed below for reference.     Microbiology: No results found for this or any previous visit (from the past 240 hour(s)).   Labs: BNP (last 3 results)  Recent  Labs  04/16/17 1636  BNP 19.3   Basic Metabolic Panel:  Recent Labs Lab 04/27/17 1729 04/27/17 1742 04/28/17 0503  NA 130* 129* 131*  K 4.0 4.0 3.7  CL 93* 90* 97*  CO2 28  --  29  GLUCOSE 92 89 108*  BUN 10 11 8   CREATININE 0.51 0.60 0.53  CALCIUM 9.2  --  8.6*   Liver Function Tests:  Recent Labs Lab 04/27/17 1729  AST 24  ALT 16  ALKPHOS 69  BILITOT 0.5  PROT 6.4*  ALBUMIN 3.8   No results for input(s): LIPASE, AMYLASE in the last 168 hours. No results for input(s): AMMONIA in the last 168 hours. CBC:  Recent Labs Lab 04/27/17 1729 04/27/17 1742 04/28/17 0503  WBC 5.9  --  4.0  NEUTROABS 4.3  --   --   HGB 10.1* 11.9* 9.7*  HCT 32.5* 35.0* 31.7*  MCV 86.0  --  85.7  PLT 237  --  227   Cardiac Enzymes: No results for input(s): CKTOTAL, CKMB, CKMBINDEX, TROPONINI in the last 168 hours. BNP: Invalid input(s): POCBNP CBG: No results for input(s): GLUCAP in the last 168 hours. D-Dimer No results for input(s): DDIMER in the last 72 hours. Hgb A1c  No results for input(s): HGBA1C in the last 72 hours. Lipid Profile  Recent Labs  04/28/17 0503  CHOL 165  HDL 72  LDLCALC 82  TRIG 53  CHOLHDL 2.3   Thyroid function studies No results for input(s): TSH, T4TOTAL, T3FREE, THYROIDAB in the last 72 hours.  Invalid input(s): FREET3 Anemia work up No results for input(s): VITAMINB12, FOLATE, FERRITIN, TIBC, IRON, RETICCTPCT in the last 72 hours. Urinalysis    Component Value Date/Time   COLORURINE YELLOW 04/16/2017 1637   APPEARANCEUR CLEAR 04/16/2017 1637   LABSPEC 1.009 04/16/2017 1637   PHURINE 7.5 04/16/2017 1637   GLUCOSEU NEGATIVE 04/16/2017 1637   HGBUR NEGATIVE 04/16/2017 1637   BILIRUBINUR NEGATIVE 04/16/2017 1637   KETONESUR NEGATIVE 04/16/2017 1637   PROTEINUR NEGATIVE 04/16/2017 1637   NITRITE NEGATIVE 04/16/2017 1637   LEUKOCYTESUR NEGATIVE 04/16/2017 1637   Sepsis Labs Invalid input(s): PROCALCITONIN,  WBC,   LACTICIDVEN Microbiology No results found for this or any previous visit (from the past 240 hour(s)).   Time coordinating discharge: Over 30 minutes  SIGNED:   Debbe Odea, MD  [facility] 04/28/2017, 4:25 PM Pager   If 7PM-7AM, please contact night-coverage [email or web address] Password [email or web address]

## 2017-04-28 NOTE — Evaluation (Signed)
Occupational Therapy Evaluation Patient Details Name: Jeanne Cruz MRN: [id number] DOB: [birth year] Today's Date: 04/28/2017    History of Present Illness 83 y.o. female who presents with headache and numbness on the right side of her body as well as visual abnormalities, found to have subacute ischemia in the right cerebellum   Clinical Impression   This 83 y/o F presents with the above. Pt lives in assisted living facility, reports at baseline she is mod independent with ADLs and functional mobility (using rollator). Pt currently requires minGuard assist during functional mobility with RW and ADL completion. Pt does report "marbles"/spots in visual field, reports they come and go intermittently, did not appear to interfere with Pt's functional task completion this session. Pt will benefit from continued OT services while in acute setting to  maximize Pt's safety and independence with ADLs and functional mobility prior to discharge.     Follow Up Recommendations  No OT follow up;Supervision - Intermittent    Equipment Recommendations  None recommended by OT           Precautions / Restrictions Precautions Precautions: Fall Restrictions Weight Bearing Restrictions: No      Mobility Bed Mobility Overal bed mobility: Modified Independent             General bed mobility comments: OOB in recliner   Transfers Overall transfer level: Needs assistance Equipment used: Rolling walker (2 wheeled) Transfers: Sit to/from Stand Sit to Stand: Supervision         General transfer comment: supervision for safety     Balance Overall balance assessment: Needs assistance Sitting-balance support: Feet supported Sitting balance-Leahy Scale: Good Sitting balance - Comments: able to put on socks while sitting EOB     Standing balance-Leahy Scale: Fair Standing balance comment: standing at sink to wash hands              High level balance activites: Direction changes;Head  turns High Level Balance Comments: supervision for safety           ADL either performed or assessed with clinical judgement   ADL Overall ADL's : Needs assistance/impaired Eating/Feeding: Independent;Sitting   Grooming: Wash/dry hands;Standing;Supervision/safety   Upper Body Bathing: Supervision/ safety;Sitting   Lower Body Bathing: Min guard;Sit to/from stand   Upper Body Dressing : Supervision/safety;Sitting   Lower Body Dressing: Min guard;Sit to/from stand Lower Body Dressing Details (indicate cue type and reason): doffs/dons socks seated in recliner  Toilet Transfer: Min guard;Ambulation;Regular Toilet;Grab bars Toilet Transfer Details (indicate cue type and reason): Pt leaves RW at door to enter bathroom, reports this is what she typically does at home, Pt proceeds to enter bathroom without RW with MinGuard assist, educated Pt on safety RW use  Toileting- Clothing Manipulation and Hygiene: Min guard;Sit to/from stand       Functional mobility during ADLs: Min guard;Rolling walker General ADL Comments: Pt completed seated LB dressing doffing/donning socks, toilet transfer, toileting, standing grooming ADLs all with MinGuard assist throughout     Vision Baseline Vision/History: Wears glasses Wears Glasses: At all times Patient Visual Report: Other (comment) (spots/"marbles in vision") Vision Assessment?: Vision impaired- to be further tested in functional context Additional Comments: Pt reports "marbles" in vision, reports it comes and goes, denies blurred or double vision, periphery and occular ROM appear to be intact, no visual deficits noted during functional task completion though Pt does report seeing spots/"marbles" while sitting on toilet which subside prior to standing  Pertinent Vitals/Pain Pain Assessment: Faces Faces Pain Scale: Hurts a little bit Pain Location: head Pain Descriptors / Indicators: Headache Pain Intervention(s): Monitored  during session     Hand Dominance Right   Extremity/Trunk Assessment Upper Extremity Assessment Upper Extremity Assessment: Generalized weakness   Lower Extremity Assessment Lower Extremity Assessment: Defer to PT evaluation   Cervical / Trunk Assessment Cervical / Trunk Assessment: Kyphotic (scoliosis)   Communication Communication Communication: No difficulties   Cognition Arousal/Alertness: Awake/alert Behavior During Therapy: WFL for tasks assessed/performed Overall Cognitive Status: Within Functional Limits for tasks assessed                                                      Home Living Family/patient expects to be discharged to:: Assisted living     Type of Home: Assisted living Home Access: Elevator     Home Layout: One level     Bathroom Shower/Tub: Walk-in Corporate treasurer Toilet: Handicapped height     Home Equipment: Environmental consultant - 4 wheels;Cane - single point          Prior Functioning/Environment Level of Independence: Independent with assistive device(s)        Comments: uses 4 wheeled walker at home        OT Problem List: Decreased strength;Impaired vision/perception;Decreased activity tolerance      OT Treatment/Interventions: Self-care/ADL training;DME and/or AE instruction;Therapeutic activities;Balance training;Visual/perceptual remediation/compensation;Patient/family education    OT Goals(Current goals can be found in the care plan section) Acute Rehab OT Goals Patient Stated Goal: to go home OT Goal Formulation: With patient Time For Goal Achievement: 05/12/17 Potential to Achieve Goals: Good  OT Frequency: Min 2X/week                             AM-PAC PT "6 Clicks" Daily Activity     Outcome Measure Help from another person eating meals?: None Help from another person taking care of personal grooming?: A Little Help from another person toileting, which includes using toliet, bedpan, or  urinal?: A Little Help from another person bathing (including washing, rinsing, drying)?: A Little Help from another person to put on and taking off regular upper body clothing?: None Help from another person to put on and taking off regular lower body clothing?: A Little 6 Click Score: 20   End of Session Equipment Utilized During Treatment: Gait belt;Rolling walker Nurse Communication: Mobility status  Activity Tolerance: Patient tolerated treatment well Patient left: in chair;with call bell/phone within reach  OT Visit Diagnosis: Other symptoms and signs involving the nervous system (R29.898);Muscle weakness (generalized) (M62.81)                Time: 1207-1227 OT Time Calculation (min): 20 min Charges:  OT General Charges $OT Visit: 1 Procedure OT Evaluation $OT Eval Low Complexity: 1 Procedure G-Codes: OT G-codes **NOT FOR INPATIENT CLASS** Functional Assessment Tool Used: AM-PAC 6 Clicks Daily Activity;Clinical judgement Functional Limitation: Self care Self Care Current Status (T5573): At least 20 percent but less than 40 percent impaired, limited or restricted Self Care Goal Status (U2025): At least 1 percent but less than 20 percent impaired, limited or restricted   Lou Cal, OT Pager [phone] 04/28/2017  Raymondo Band 04/28/2017, 3:49 PM

## 2017-04-28 NOTE — Progress Notes (Signed)
STROKE TEAM PROGRESS NOTE   HISTORY OF PRESENT ILLNESS (per record) Jeanne Cruz is an 83 y.o. female with a history of iron-deficiency anemia, GI bleeding with subsequent blood transfusion (2016), neuropathy, shingles with postherpetic neuralgia, and thyroid disease who presented with headache and numbness on the right side of her body since the afternoon of 04/26/2017. She awoke on the  morning of 04/27/2017 with red and blue circles in her vision. Her symptoms have been persistent since onset. She went to her eye doctor this afternoon; per report, a visual field cut was suspected and she was sent to the ED for stroke work up.  CT in the ED reveals a focus of subacute ischemia in the right cerebellum. No definite associated mass effect. No hemorrhage. Age related atrophy. Mild chronic small vessel ischemia.  She has a past medical history significant for ventral hernia and remote GI bleed (per patient last episode may have been in 2016). She has required blood transfusion for anemia secondary to GIB in the past. She recently was admitted on 8/3 for fatigue and 1 week of melena. Of note, she does not use NSAIDs or anticoagulants.  Patient was not administered IV t-PA secondary to arriving outside of the treatment window. She was admitted to General Neurology for further evaluation and treatment.   SUBJECTIVE (INTERVAL HISTORY) No family in the room.  The patient is awake, alert, and follows all commands appropriately.  Pt reports seeing "round circles" in her field of vision.  Pt reports severe GI bleeding, where she lost "almost all of [her] blood" in 2016.  EGD at that time showed esophageal erosion, and a "vein that was leaking" that healed without surgery.  F/u endoscopy without any continued source of bleeding.  Per the patient, physician did not place her back on ASA, but did not specifically instruct her to not take it.  Pt reports occasional palpitations, "nervous heart".  Discussed risks  versus benefits of starting patient on aspirin for stroke prevention and other anticoagulation if atrial fibrillation is diagnosed.  Pt expressed interest in pursuing additional diagnostic testing for atrial fibrillation.  Pt wants more time top think about starting anticoagulation.  On exam, pt notes that weakness in left leg is chronic.  Discontinued subcutaneous lovenox.  SCDs ordered.    OBJECTIVE Temp:  [97.9 F (36.6 C)-98.8 F (37.1 C)] 97.9 F (36.6 C) (08/14 0947) Pulse Rate:  [76-94] 83 (08/14 0947) Cardiac Rhythm: Heart block (08/14 0700) Resp:  [16-32] 19 (08/14 0947) BP: (91-173)/(60-87) 135/60 (08/14 0947) SpO2:  [93 %-97 %] 96 % (08/14 0947) Weight:  [40.1 kg (88 lb 6.4 oz)] 40.1 kg (88 lb 6.4 oz) (08/13 2339)  CBC:  Recent Labs Lab 04/27/17 1729 04/27/17 1742 04/28/17 0503  WBC 5.9  --  4.0  NEUTROABS 4.3  --   --   HGB 10.1* 11.9* 9.7*  HCT 32.5* 35.0* 31.7*  MCV 86.0  --  85.7  PLT 237  --  300    Basic Metabolic Panel:  Recent Labs Lab 04/27/17 1729 04/27/17 1742 04/28/17 0503  NA 130* 129* 131*  K 4.0 4.0 3.7  CL 93* 90* 97*  CO2 28  --  29  GLUCOSE 92 89 108*  BUN 10 11 8   CREATININE 0.51 0.60 0.53  CALCIUM 9.2  --  8.6*    Lipid Panel:    Component Value Date/Time   CHOL 165 04/28/2017 0503   TRIG 53 04/28/2017 0503   HDL 72 04/28/2017 0503  CHOLHDL 2.3 04/28/2017 0503   VLDL 11 04/28/2017 0503   LDLCALC 82 04/28/2017 0503   HgbA1c: No results found for: HGBA1C Urine Drug Screen: No results found for: LABOPIA, COCAINSCRNUR, LABBENZ, AMPHETMU, THCU, LABBARB  Alcohol Level No results found for: [place] I have personally reviewed the radiological images below and agree with the radiology interpretations.  Ct Head Wo Contrast 04/27/2017 IMPRESSION: 1. Suspect focus of subacute ischemia in the right cerebellum. No definite associated mass effect. No hemorrhage. Consider MRI for further evaluation. 2. Age related atrophy.  Mild  chronic small vessel ischemia.  Ct Angio Neck W Or Wo Contrast 04/28/2017 IMPRESSION: Tortuous vessels suggesting a history of chronic hypertension. No atherosclerotic stenosis.  No sign of dissection. Beaded irregularity of the distal internal carotid arteries and distal vertebral arteries consistent with fibromuscular dysplasia. No evidence of flow limiting stenosis or measurable pseudo aneurysm. This could be a source of embolic disease. 5 mm and 6 mm pulmonary nodules right upper lobe. Complete chest CT suggested when able. These may require follow-up.  Mr Brain 20 Contrast Mr Virgel Paling Wo Contrast 04/27/2017 IMPRESSION: MRI head: 1. Small acute/early subacute infarctions within the right inferolateral cerebellar hemisphere and right occipital lobe cortex. No hemorrhage. 2. Moderate chronic microvascular ischemic changes and moderate parenchymal volume loss of the brain. Multiple chronic lacunar infarcts within the cerebellum. MRA head: Patent circle of Willis. No large vessel occlusion, aneurysm, or significant stenosis is identified.  TTE 04/28/2017 Left ventricle: The cavity size was normal. Systolic function was   normal. The estimated ejection fraction was in the range of 60%   to 65%. Wall motion was normal; there were no regional wall   motion abnormalities. Doppler parameters are consistent with   abnormal left ventricular relaxation (grade 1 diastolic   dysfunction). - Aortic valve: There was mild regurgitation directed eccentrically   in the LVOT and along the septum. - Mitral valve: Calcified annulus. - Pulmonary arteries: PA peak pressure: 31 mm Hg (S).   PHYSICAL EXAM  Temp:  [97.9 F (36.6 C)-98.8 F (37.1 C)] 98 F (36.7 C) (08/14 1910) Pulse Rate:  [76-93] 93 (08/14 1910) Resp:  [16-20] 20 (08/14 1910) BP: (91-137)/(59-91) 117/91 (08/14 1910) SpO2:  [92 %-97 %] 97 % (08/14 1910) Weight:  [88 lb 6.4 oz (40.1 kg)] 88 lb 6.4 oz (40.1 kg) (08/13 2339)  General - Well  nourished, well developed, in no apparent distress.  Ophthalmologic - Fundi not visualized due to eye movement.  Cardiovascular - Regular rate and rhythm.  Mental Status -  Level of arousal and orientation to time, place, and person were intact. Language including expression, naming, repetition, comprehension was assessed and found intact.  Cranial Nerves II - XII - II - Visual field intact OU. III, IV, VI - Extraocular movements intact. V - Facial sensation intact bilaterally. VII - Facial movement intact bilaterally. VIII - Hearing & vestibular intact bilaterally. X - Palate elevates symmetrically. XI - Chin turning & shoulder shrug intact bilaterally. XII - Tongue protrusion intact.  Motor Strength - The patient's strength was normal in all extremities except chronic LLE 4/5 proximally and pronator drift was absent.  Bulk was normal and fasciculations were absent.   Motor Tone - Muscle tone was assessed at the neck and appendages and was normal.  Reflexes - The patient's reflexes were 1+ in all extremities and she had no pathological reflexes.  Sensory - Light touch, temperature/pinprick were assessed and were symmetrical.    Coordination -  The patient had normal movements in the hands with no ataxia or dysmetria.  Tremor was absent.  Gait and Station - deferred   ASSESSMENT/PLAN Jeanne Cruz is a 83 y.o. female with history of  iron-deficiency anemia, GI bleeding with subsequent blood transfusion (2016), neuropathy, shingles with postherpetic neuralgia, and thyroid disease who presented with headache and numbness on the right side of her body since the afternoon of 04/26/2017. She did not receive IV t-PA due to arriving outside of the treatment window.   Stroke: Small acute/early subacute infarctions within the right inferolateral cerebellar hemisphere and right occipital lobe cortex, possibly embolic, suspicious of afib due to hx of heart palpitation  Resultant   Deficit resolved.  CT head: Suspect focus of subacute ischemia in the right cerebellum  MRI head: Small acute/early subacute infarctions within the right inferolateral cerebellar hemisphere and right occipital lobe cortex.  MRA head: no LVO or high-grade stenosis    CTA neck: Bilateral distal ICA beaded irregularity suggesting fibromuscular dysplasia.  Tortuous vessels suggesting chronic hypertension.  No LVO or high-grade stenosis.  2D Echo EF 60-65%   Discussed with pt regarding further work up with TEE/loop vs. No more embolic eval due to bleeding risk. Pt would like more time to think about it.   LDL 82  HgbA1c pending  SCDs for VTE prophylaxis  Diet Heart Room service appropriate? Yes; Fluid consistency: Thin  No antithrombotic prior to admission, now on No antithrombotic. Recommend to resume ASA.  Patient counseled to be compliant with her antithrombotic medications  Ongoing aggressive stroke risk factor management  Therapy recommendations:  pending  Disposition:  Pending  Hx of GIB  Severe in the past s/p transfusion  Recent melena s/p EGD no significant finding but non-bleeding erosive gastropathy.   Ok to resume ASA  Pt is thinking about embolic work up this time.    Hypertension  Stable  Permissive hypertension (OK if < 220/120) but gradually normalize in 5-7 days  Long-term BP goal normotensive  Hyperlipidemia  Home meds: none  LDL 82, goal < 70  Add atorvastatin 20mg  PO daily  Continue statin at discharge   Other Stroke Risk Factors  Advanced age  Other Active Problems  Pulmonary nodules: incidental finding.  Consider CT Chest to evaluate further.  Hospital day # 0  Rosalin Hawking, MD PhD Stroke Neurology 04/28/2017 11:40 PM   To contact Stroke Continuity provider, please refer to [email or web address] After hours, contact General Neurology

## 2017-04-28 NOTE — Progress Notes (Addendum)
Consult from RN stating that pt is being D/C'd, request has been received. CSW attempting to follow up at present time.  Per RN and notes, pt is from [place].  CSW called and spoke to Nicole Kindred at the front desk who reported he is expecting the pt and pt will be going to room 17.  CSW stated RN would need to consult Case Management for pt's possible Home Health needs  Please reconsult if future social work needs arise.  CSW signing off, as social work intervention is no longer needed.   Alphonse Guild. Sable Knoles, Reed Pandy, CSI Clinical Social Worker Ph: [phone]

## 2017-04-28 NOTE — Evaluation (Signed)
Physical Therapy Evaluation Patient Details Name: Jeanne Cruz MRN: [id number] DOB: October 07, 1927 Today's Date: 04/28/2017   History of Present Illness  83 y.o. female who presents with headache and numbness on the right side of her body as well as visual abnormalities, found to have subacute ischemia in the right cerebellum  Clinical Impression  Orders received for PT evaluation. Patient demonstrates modest deficits in functional mobility but reports this is baseline mobility. Patient ambulated in hall with use of RW without difficulty. Anticipate patient will be safe for d/c back to ALF. No further acute PT needs, will sign off.    Follow Up Recommendations No PT follow up    Equipment Recommendations  None recommended by PT    Recommendations for Other Services       Precautions / Restrictions Precautions Precautions: Fall      Mobility  Bed Mobility Overal bed mobility: Modified Independent             General bed mobility comments: increased time to perform, no physical assist required  Transfers Overall transfer level: Needs assistance Equipment used: Rolling walker (2 wheeled) Transfers: Sit to/from Stand Sit to Stand: Supervision         General transfer comment: supervision for safety   Ambulation/Gait Ambulation/Gait assistance: Supervision Ambulation Distance (Feet): 180 Feet Assistive device: Rolling walker (2 wheeled) Gait Pattern/deviations: Step-through pattern;Decreased stride length;Trunk flexed Gait velocity: decreased Gait velocity interpretation: Below normal speed for age/gender General Gait Details: steady with use of rolling walker during ambulation  Stairs            Wheelchair Mobility    Modified Rankin (Stroke Patients Only) Modified Rankin (Stroke Patients Only) Pre-Morbid Rankin Score: No symptoms Modified Rankin: Moderate disability     Balance Overall balance assessment: Needs assistance   Sitting balance-Leahy  Scale: Good Sitting balance - Comments: able to put on socks while sitting EOB     Standing balance-Leahy Scale: Fair Standing balance comment: modest instability, improved with use of assistive device             High level balance activites: Direction changes;Head turns High Level Balance Comments: supervision for safety             Pertinent Vitals/Pain Pain Assessment: Faces Faces Pain Scale: Hurts little more Pain Location: head and back Pain Descriptors / Indicators: Headache Pain Intervention(s): Monitored during session    Home Living Family/patient expects to be discharged to:: Assisted living     Type of Home: Assisted living Home Access: Elevator     Home Layout: One level Home Equipment: Environmental consultant - 4 wheels;Cane - single point      Prior Function Level of Independence: Independent with assistive device(s)         Comments: uses 4 wheeled walker at home     Hand Dominance   Dominant Hand: Right    Extremity/Trunk Assessment   Upper Extremity Assessment Upper Extremity Assessment: Generalized weakness    Lower Extremity Assessment Lower Extremity Assessment: Generalized weakness    Cervical / Trunk Assessment Cervical / Trunk Assessment: Kyphotic (scoliosis)  Communication   Communication: No difficulties  Cognition Arousal/Alertness: Awake/alert Behavior During Therapy: WFL for tasks assessed/performed Overall Cognitive Status: Within Functional Limits for tasks assessed                                        General Comments  Exercises     Assessment/Plan    PT Assessment All further PT needs can be met in the next venue of care  PT Problem List Decreased activity tolerance;Pain       PT Treatment Interventions      PT Goals (Current goals can be found in the Care Plan section)  Acute Rehab PT Goals Patient Stated Goal: to go home PT Goal Formulation: All assessment and education complete, DC  therapy    Frequency     Barriers to discharge        Co-evaluation               AM-PAC PT "6 Clicks" Daily Activity  Outcome Measure Difficulty turning over in bed (including adjusting bedclothes, sheets and blankets)?: A Little Difficulty moving from lying on back to sitting on the side of the bed? : A Little Difficulty sitting down on and standing up from a chair with arms (e.g., wheelchair, bedside commode, etc,.)?: A Little Help needed moving to and from a bed to chair (including a wheelchair)?: None Help needed walking in hospital room?: None Help needed climbing 3-5 steps with a railing? : A Little 6 Click Score: 20    End of Session Equipment Utilized During Treatment: Gait belt Activity Tolerance: Patient tolerated treatment well Patient left: in chair;with call bell/phone within reach Nurse Communication: Mobility status PT Visit Diagnosis: Other symptoms and signs involving the nervous system (R29.898)    Time: 4037-5436 PT Time Calculation (min) (ACUTE ONLY): 18 min   Charges:   PT Evaluation $PT Eval Moderate Complexity: 1 Mod     PT G Codes:   PT G-Codes **NOT FOR INPATIENT CLASS** Functional Assessment Tool Used: Clinical judgement Functional Limitation: Mobility: Walking and moving around Mobility: Walking and Moving Around Current Status (G6770): At least 1 percent but less than 20 percent impaired, limited or restricted Mobility: Walking and Moving Around Goal Status [phone]): At least 1 percent but less than 20 percent impaired, limited or restricted Mobility: Walking and Moving Around Discharge Status [phone]): At least 1 percent but less than 20 percent impaired, limited or restricted    Alben Deeds, PT DPT  Board Certified Neurologic Specialist [place] 04/28/2017, 12:18 PM

## 2017-04-28 NOTE — Evaluation (Signed)
Speech Language Pathology Evaluation Patient Details Name: Jeanne Cruz MRN: [id number] DOB: 1928/05/02 Today's Date: 04/28/2017 Time: 8811-0315 SLP Time Calculation (min) (ACUTE ONLY): 11 min  Problem List:  Patient Active Problem List   Diagnosis Date Noted  . Ischemic stroke ([place]) 04/27/2017  . Hyponatremia 04/27/2017  . Normocytic anemia   . Acute blood loss anemia 04/17/2017  . GI bleed 04/16/2017  . GLAUCOMA 04/17/2008  . COLONIC POLYPS 04/12/2008  . CONSTIPATION 04/12/2008  . BRONCHITIS, RECURRENT 09/01/2007  . NECK AND BACK PAIN 09/01/2007  . Anxiety state 06/28/2007  . HIATAL HERNIA 06/28/2007  . DEGENERATIVE JOINT DISEASE 06/28/2007  . SCOLIOSIS 06/28/2007  . ANEMIA, HX OF 06/28/2007   Past Medical History:  Past Medical History:  Diagnosis Date  . Anemia   . Anxiety   . Arthritis   . Degenerative disc disease, cervical   . GI bleeding   . Iron deficiency anemia   . Neuropathy   . Postherpetic neuralgia   . Shingles   . Thyroid disease    Past Surgical History:  Past Surgical History:  Procedure Laterality Date  . ABDOMINAL HYSTERECTOMY    . APPENDECTOMY    . BACK SURGERY    . ESOPHAGOGASTRODUODENOSCOPY N/A 04/17/2017   Procedure: ESOPHAGOGASTRODUODENOSCOPY (EGD);  Surgeon: Carol Ada, MD;  Location: [facility];  Service: Endoscopy;  Laterality: N/A;  . TONSILLECTOMY     HPI:  Jeanne Aung Corbettis an 83 y.o.femalewho presents with headache and numbness on the right side of her body as well as visual abnormalities, found to have subacute ischemia in the right cerebellum.    Assessment / Plan / Recommendation Clinical Impression  Pt demosntrates adequate cognitive linguistic function. Indpendnet with ADLS and complex expressive and receptive language, reading in tact. Able to locate items in a functional task on tray. No SLP f/u needed, will sign off.     SLP Assessment  SLP Recommendation/Assessment: Patient does not need any further Speech  [place] [facility]    Follow Up Recommendations  None          SLP Evaluation Cognition  Overall Cognitive Status: Within Functional Limits for tasks assessed       Comprehension  Auditory Comprehension Overall Auditory Comprehension: Appears within functional limits for tasks assessed Reading Comprehension Reading Status: Within funtional limits    Expression Verbal Expression Overall Verbal Expression: Appears within functional limits for tasks assessed   Oral / Motor  Oral Motor/Sensory Function Overall Oral Motor/Sensory Function: Within functional limits Motor Speech Overall Motor Speech: Appears within functional limits for tasks assessed   GO                    Taeshaun Rames, Katherene Ponto 04/28/2017, 11:06 AM

## 2017-04-28 NOTE — Progress Notes (Signed)
Patient admitted to 5C19 from ED for stroke work up. Pt alert and oriented x4 complains of a 8/10 pain (headache) relieved by xanax for sleep. Patient oriented to  Call light and room environment. Admissions and skin assessments completed. Patient placed on tele; stroke care plan and education done. Patient educated and placed fall precautions wit call light in reach. Will continue to monitor.

## 2017-04-28 NOTE — Progress Notes (Signed)
  Echocardiogram 2D Echocardiogram has been performed.  Tresa Res 04/28/2017, 4:13 PM

## 2017-04-29 LAB — HEMOGLOBIN A1C
HEMOGLOBIN A1C: 4.9 % (ref 4.8–5.6)
Mean Plasma Glucose: 94 mg/dL

## 2017-04-29 NOTE — Care Management Note (Signed)
Case Management Note  Patient Details  Name: Jeanne Cruz MRN: [id number] Date of Birth: 1927-10-05  Subjective/Objective:                    Action/Plan: 04/29/2017 at 35 am: Pt discharged back to [facility] late yesterday. No f/u or DME needs per PT/OT. No further needs per CM.  Expected Discharge Date:  04/17/17               Expected Discharge Plan:  Home/Self Care  In-House Referral:     Discharge planning Services     Post Acute Care Choice:    Choice offered to:     DME Arranged:    DME Agency:     HH Arranged:    HH Agency:     Status of Service:  Completed, signed off  If discussed at [employer] of Stay Meetings, dates discussed:    Additional Comments:  Pollie Friar, RN 04/29/2017, 10:25 AM

## 2017-04-29 NOTE — Progress Notes (Signed)
  Patient is hospital day 2 s/p stroke work up. Discharge teaching done. PIV discontinued. Patient discharged via w/c aaccompanied by grand daughter.

## 2017-10-17 ENCOUNTER — Encounter (HOSPITAL_BASED_OUTPATIENT_CLINIC_OR_DEPARTMENT_OTHER): Payer: Self-pay

## 2017-10-17 ENCOUNTER — Emergency Department (HOSPITAL_BASED_OUTPATIENT_CLINIC_OR_DEPARTMENT_OTHER): Payer: Medicare Other

## 2017-10-17 ENCOUNTER — Other Ambulatory Visit: Payer: Self-pay

## 2017-10-17 ENCOUNTER — Emergency Department (HOSPITAL_BASED_OUTPATIENT_CLINIC_OR_DEPARTMENT_OTHER)
Admission: EM | Admit: 2017-10-17 | Discharge: 2017-10-17 | Disposition: A | Discharge status: Home / Self Care | Payer: Medicare Other | Attending: Emergency Medicine | Admitting: Emergency Medicine

## 2017-10-17 DIAGNOSIS — R109 Unspecified abdominal pain: Secondary | ICD-10-CM | POA: Diagnosis present

## 2017-10-17 DIAGNOSIS — Z79899 Other long term (current) drug therapy: Secondary | ICD-10-CM | POA: Diagnosis not present

## 2017-10-17 DIAGNOSIS — Z7982 Long term (current) use of aspirin: Secondary | ICD-10-CM | POA: Diagnosis not present

## 2017-10-17 DIAGNOSIS — K5641 Fecal impaction: Secondary | ICD-10-CM | POA: Insufficient documentation

## 2017-10-17 HISTORY — DX: Encounter for other specified aftercare: Z51.89

## 2017-10-17 HISTORY — DX: Cerebral infarction, unspecified: I63.9

## 2017-10-17 LAB — CBC WITH DIFFERENTIAL/PLATELET
BASOS ABS: 0 10*3/uL (ref 0.0–0.1)
BASOS PCT: 0 %
EOS ABS: 0.1 10*3/uL (ref 0.0–0.7)
EOS PCT: 1 %
HCT: 43.4 % (ref 36.0–46.0)
Hemoglobin: 14.3 g/dL (ref 12.0–15.0)
LYMPHS ABS: 0.8 10*3/uL (ref 0.7–4.0)
Lymphocytes Relative: 11 %
MCH: 31.9 pg (ref 26.0–34.0)
MCHC: 32.9 g/dL (ref 30.0–36.0)
MCV: 96.9 fL (ref 78.0–100.0)
Monocytes Absolute: 0.5 10*3/uL (ref 0.1–1.0)
Monocytes Relative: 6 %
Neutro Abs: 6.3 10*3/uL (ref 1.7–7.7)
Neutrophils Relative %: 82 %
PLATELETS: 244 10*3/uL (ref 150–400)
RBC: 4.48 MIL/uL (ref 3.87–5.11)
RDW: 13.6 % (ref 11.5–15.5)
WBC: 7.7 10*3/uL (ref 4.0–10.5)

## 2017-10-17 LAB — COMPREHENSIVE METABOLIC PANEL
ALT: 24 U/L (ref 14–54)
AST: 30 U/L (ref 15–41)
Albumin: 4.4 g/dL (ref 3.5–5.0)
Alkaline Phosphatase: 77 U/L (ref 38–126)
Anion gap: 8 (ref 5–15)
BUN: 14 mg/dL (ref 6–20)
CHLORIDE: 91 mmol/L — AB (ref 101–111)
CO2: 29 mmol/L (ref 22–32)
CREATININE: 0.49 mg/dL (ref 0.44–1.00)
Calcium: 10.1 mg/dL (ref 8.9–10.3)
GFR calc non Af Amer: >60 mL/min (ref >60)
Glucose, Bld: 101 mg/dL — ABNORMAL HIGH (ref 65–99)
Potassium: 4.3 mmol/L (ref 3.5–5.1)
SODIUM: 128 mmol/L — AB (ref 135–145)
Total Bilirubin: 0.7 mg/dL (ref 0.3–1.2)
Total Protein: 7.1 g/dL (ref 6.5–8.1)

## 2017-10-17 LAB — LIPASE, BLOOD: Lipase: 32 U/L (ref 11–51)

## 2017-10-17 LAB — I-STAT CG4 LACTIC ACID, ED: LACTIC ACID, VENOUS: 0.78 mmol/L (ref 0.5–1.9)

## 2017-10-17 MED ORDER — FENTANYL CITRATE (PF) 100 MCG/2ML IJ SOLN
50.0000 ug | Freq: Once | INTRAMUSCULAR | Status: AC
  Administered 2017-10-17: 50 ug via INTRAVENOUS
  Filled 2017-10-17: qty 2

## 2017-10-17 MED ORDER — IOPAMIDOL (ISOVUE-300) INJECTION 61%
100.0000 mL | Freq: Once | INTRAVENOUS | Status: AC | PRN
  Administered 2017-10-17: 85 mL via INTRAVENOUS

## 2017-10-17 MED ORDER — LIDOCAINE HCL 2 % EX GEL
CUTANEOUS | Status: AC
  Filled 2017-10-17: qty 20

## 2017-10-17 MED ORDER — SODIUM CHLORIDE 0.9 % IV SOLN
INTRAVENOUS | Status: DC
  Administered 2017-10-17: 04:00:00 via INTRAVENOUS

## 2017-10-17 MED ORDER — ONDANSETRON HCL 4 MG/2ML IJ SOLN
4.0000 mg | Freq: Once | INTRAMUSCULAR | Status: AC
  Administered 2017-10-17: 4 mg via INTRAVENOUS
  Filled 2017-10-17: qty 2

## 2017-10-17 NOTE — ED Notes (Signed)
Patient transported to CT and accompanied by this RN

## 2017-10-17 NOTE — ED Triage Notes (Signed)
Pt c/o diffuse abdominal pain since Thursday with nausea, states hx of hernia, abdomen is taut and has large mass protruding towards left side and pt is SOB

## 2017-10-17 NOTE — ED Provider Notes (Addendum)
[facility] Provider Note: Georgena Spurling, MD, FACEP  CSN: [id number] MRN: [id number] ARRIVAL: 10/17/17 at [place]: [place]  Abdominal Pain   HISTORY OF PRESENT ILLNESS  10/17/17 2:53 AM Jeanne Cruz is a 84 y.o. female with a known hiatal hernia.  She is here with a 2-day history of abdominal pain and distention.  The onset has been gradual.  She rates her pain as a 9 or 10 out of 10 at the present time.  She characterizes the pain as aching, burning, cramping, dull and full feeling.  Pain is somewhat worse with movement or palpation.  She has had some transient nausea but no vomiting.  She has had decreased bowel movements despite using MiraLAX.  She feels short of breath and is having difficulty taking deep breaths due to the abdominal distention.   Past Medical History:  Diagnosis Date  . Anemia   . Anxiety   . Arthritis   . Blood transfusion without reported diagnosis   . Degenerative disc disease, cervical   . GI bleeding   . Iron deficiency anemia   . Neuropathy   . Postherpetic neuralgia   . Shingles   . Stroke [facility])     Past Surgical History:  Procedure Laterality Date  . ABDOMINAL HYSTERECTOMY    . APPENDECTOMY    . BACK SURGERY    . ESOPHAGOGASTRODUODENOSCOPY N/A 04/17/2017   Procedure: ESOPHAGOGASTRODUODENOSCOPY (EGD);  Surgeon: Carol Ada, MD;  Location: [facility];  Service: Endoscopy;  Laterality: N/A;  . TONSILLECTOMY      Family History  Problem Relation Age of Onset  . Colon cancer Sister   . Heart attack Sister     Social History   Tobacco Use  . Smoking status: Never Smoker  . Smokeless tobacco: Never Used  Substance Use Topics  . Alcohol use: No  . Drug use: No    Prior to Admission medications   Medication Sig Start Date End Date Taking? Authorizing Provider  albuterol (PROVENTIL HFA;VENTOLIN HFA) 108 (90 Base) MCG/ACT inhaler Inhale 1-2 puffs into the lungs every 6 (six) hours as needed for  wheezing or shortness of breath.     [provider]  ALPRAZolam Duanne Moron) 0.5 MG tablet Take 0.5 mg by mouth 2 (two) times daily as needed for anxiety or sleep.     [provider]  aspirin EC 81 MG tablet Take 1 tablet (81 mg total) by mouth daily. 04/28/17 04/28/18  Debbe Odea, MD  atorvastatin (LIPITOR) 20 MG tablet Take 1 tablet (20 mg total) by mouth daily at 6 PM. 04/28/17   Debbe Odea, MD  bimatoprost (LUMIGAN) 0.03 % ophthalmic solution Place 1 drop into both eyes at bedtime.     [provider]  dorzolamide-timolol (COSOPT) 22.3-6.8 MG/ML ophthalmic solution Place 1 drop into both eyes 2 (two) times daily.     [provider]  furosemide (LASIX) 20 MG tablet Take 20 mg by mouth daily as needed for edema.     [provider]  gabapentin (NEURONTIN) 100 MG capsule Take 100 mg by mouth every 6 (six) hours.     [provider]  Multiple Vitamin (MULTIVITAMIN WITH MINERALS) TABS tablet Take 1 tablet by mouth daily.    [provider]  nystatin (NYSTATIN) powder Apply 2 g topically 2 (two) times daily as needed (for irritation).    [provider]  pantoprazole (PROTONIX) 40 MG tablet Take 1 tablet (40 mg total) by  mouth 2 (two) times daily. 04/17/17   Dessa Phi, DO  polyethylene glycol (MIRALAX / GLYCOLAX) packet Take 17 g by mouth 2 (two) times daily.    [provider]  traMADol (ULTRAM) 50 MG tablet Take 50 mg by mouth every 6 (six) hours as needed for moderate pain.     [provider]  Vitamin D, Ergocalciferol, (DRISDOL) 50000 units CAPS capsule Take 50,000 Units by mouth every 14 (fourteen) days.    [provider]    Allergies Ampicillin; Codeine; and Penicillins   REVIEW OF SYSTEMS  Negative except as noted here or in the History of Present Illness.   PHYSICAL EXAMINATION  Initial Vital Signs Blood pressure (!) 200/104, pulse 89, temperature 97.8 F (36.6 C), temperature  source Oral, resp. rate (!) 26, height 4\' 11"  (1.499 m), weight 39.9 kg (88 lb), SpO2 98 %.  Examination General: Well-developed, well-nourished female in no acute distress; appearance consistent with age of record HENT: normocephalic; atraumatic Eyes: pupils equal, round and reactive to light; extraocular muscles intact; cataracts bilaterally Neck: supple Heart: regular rate and rhythm Lungs: clear to auscultation bilaterally; tachypnea with accessory muscle usage and shallow respirations Abdomen: soft; distended; diffusely tender; bowel sounds present Rectal: External hemorrhoids; normal sphincter tone; copious soft stool in rectal vault not amenable to manual disimpaction Extremities: No deformity; full range of motion; pulses normal Neurologic: Awake, alert and oriented; motor function intact in all extremities and symmetric; no facial droop Skin: Warm and dry Psychiatric: Normal mood and affect   RESULTS  Summary of this visit's results, reviewed by myself:   EKG Interpretation  Date/Time:  Saturday October 17 2017 02:35:20 EST Ventricular Rate:  92 PR Interval:    QRS Duration: 84 QT Interval:  347 QTC Calculation: 430 R Axis:   -32 Text Interpretation:  Sinus rhythm Prolonged PR interval Left atrial enlargement Left axis deviation No significant change was found Confirmed by Analyse Angst [id number]) on 10/17/2017 3:01:45 AM      Laboratory Studies: Results for orders placed or performed during the hospital encounter of 10/17/17 (from the past 24 hour(s))  CBC with Differential/Platelet     Status: None   Collection Time: 10/17/17  2:43 AM  Result Value Ref Range   WBC 7.7 4.0 - 10.5 K/uL   RBC 4.48 3.87 - 5.11 MIL/uL   Hemoglobin 14.3 12.0 - 15.0 g/dL   HCT 43.4 36.0 - 46.0 %   MCV 96.9 78.0 - 100.0 fL   MCH 31.9 26.0 - 34.0 pg   MCHC 32.9 30.0 - 36.0 g/dL   RDW 13.6 11.5 - 15.5 %   Platelets 244 150 - 400 K/uL   Neutrophils Relative % 82 %   Neutro Abs 6.3 1.7 - 7.7  K/uL   Lymphocytes Relative 11 %   Lymphs Abs 0.8 0.7 - 4.0 K/uL   Monocytes Relative 6 %   Monocytes Absolute 0.5 0.1 - 1.0 K/uL   Eosinophils Relative 1 %   Eosinophils Absolute 0.1 0.0 - 0.7 K/uL   Basophils Relative 0 %   Basophils Absolute 0.0 0.0 - 0.1 K/uL  Comprehensive metabolic panel     Status: Abnormal   Collection Time: 10/17/17  2:43 AM  Result Value Ref Range   Sodium 128 (L) 135 - 145 mmol/L   Potassium 4.3 3.5 - 5.1 mmol/L   Chloride 91 (L) 101 - 111 mmol/L   CO2 29 22 - 32 mmol/L   Glucose, Bld 101 (H) 65 - 99  mg/dL   BUN 14 6 - 20 mg/dL   Creatinine, Ser 0.49 0.44 - 1.00 mg/dL   Calcium 10.1 8.9 - 10.3 mg/dL   Total Protein 7.1 6.5 - 8.1 g/dL   Albumin 4.4 3.5 - 5.0 g/dL   AST 30 15 - 41 U/L   ALT 24 14 - 54 U/L   Alkaline Phosphatase 77 38 - 126 U/L   Total Bilirubin 0.7 0.3 - 1.2 mg/dL   GFR calc non Af Amer >60 >60 mL/min   GFR calc Af Amer >60 >60 mL/min   Anion gap 8 5 - 15  Lipase, blood     Status: None   Collection Time: 10/17/17  2:43 AM  Result Value Ref Range   Lipase 32 11 - 51 U/L  I-Stat CG4 Lactic Acid, ED     Status: None   Collection Time: 10/17/17  3:07 AM  Result Value Ref Range   Lactic Acid, Venous 0.78 0.5 - 1.9 mmol/L   Imaging Studies: Ct Abdomen Pelvis W Contrast  Result Date: 10/17/2017 CLINICAL DATA:  Diffuse abdominal pain since Thursday. Nausea. Abdomen is tight with large mass protruding towards the left side. EXAM: CT ABDOMEN AND PELVIS WITH CONTRAST TECHNIQUE: Multidetector CT imaging of the abdomen and pelvis was performed using the standard protocol following bolus administration of intravenous contrast. CONTRAST:  77mL ISOVUE-300 IOPAMIDOL (ISOVUE-300) INJECTION 61% COMPARISON:  11/28/2005 FINDINGS: Lower chest: Multiple small solid and sub solid nodules demonstrated in the lung bases, new since previous study. Largest is on the right and measures about 7 mm in diameter. There is a large esophageal hiatal hernia with most  of the stomach above the diaphragm. Hepatobiliary: Multiple low-attenuation lesions in the liver. Largest is in the lateral segment left lobe and measures 1 cm diameter. No change since prior study. These are likely cysts. Gallbladder and bile ducts are unremarkable. Pancreas: Unremarkable. No pancreatic ductal dilatation or surrounding inflammatory changes. Spleen: Normal in size without focal abnormality. Adrenals/Urinary Tract: No adrenal gland nodules. Right renal cyst. Nephrograms are otherwise homogeneous and symmetrical. No hydronephrosis or hydroureter. Bladder is decompressed. Stomach/Bowel: Stomach is not abnormally distended. Small bowel are decompressed. The colon is diffusely stool-filled and distended with gas and stool. Prominent stool in the rectum. Rectal wall thickening and infiltration in the perirectal fat may represent stercoral colitis. Changes most consistent with impaction and constipation. Vascular/Lymphatic: Scattered calcifications in the aorta. No aneurysm. No significant lymphadenopathy. Reproductive: Status post hysterectomy. No adnexal masses. Other: No free air or free fluid in the abdomen. Abdominal wall musculature appears intact. Musculoskeletal: Lumbar scoliosis convex towards the left. Diffuse bone demineralization. Multiple permeative lucent changes throughout the bones may be due to osteoporosis but could also indicate anemia or diffuse permeative process such as myeloma. IMPRESSION: 1. Colon is dilated and diffusely filled with stool and gas. Prominent stool in the rectum with rectal wall thickening and infiltration of the perirectal fat could indicate stercoral colitis. Changes suggest impaction and constipation. 2. Multiple small solid and sub solid nodules demonstrated in the lung bases, new since previous study and measuring up to about 7 mm in diameter. Non-contrast chest CT at 3-6 months is recommended. If nodules persist, subsequent management will be based upon the  most suspicious nodule(s). This recommendation follows the consensus statement: Guidelines for Management of Incidental Pulmonary Nodules Detected on CT Images: From the Fleischner Society 2017; Radiology 2017; 284:228-243. 3. Aortic atherosclerosis. 4. Diffuse bone demineralization with multiple permeative lucent changes suggested throughout the bones.  This could be due to osteoporosis, and knee media, or diffuse permeative process such as myeloma or metastasis. 5. Benign-appearing cyst demonstrated in the liver and kidneys. Electronically Signed   By: Lucienne Capers M.D.   On: 10/17/2017 05:09   Dg Abd Acute W/chest  Result Date: 10/17/2017 CLINICAL DATA:  Diffuse abdominal pain. History of hernia, with LEFT mass. EXAM: DG ABDOMEN ACUTE W/ 1V CHEST COMPARISON:  Chest radiograph April 16, 2017 and CT abdomen and pelvis November 29, 2015 FINDINGS: Cardiac silhouette is moderately enlarged and unchanged. No pleural effusion or focal consolidation. No pneumothorax. Severe lower thoracic dextroscoliosis. Large hiatal hernia. Multiple loops of gas-filled bowel LEFT mid abdomen concerning for abdominal wall herniorrhaphy. Due to multiple loops of overlapping bowel, difficult to measure. Moderate amount of retained large bowel stool. Severe levoscoliosis. Osteopenia. Phleboliths project in the pelvis. Potential loose body RIGHT hip. IMPRESSION: Moderate cardiomegaly.  No acute pulmonary process. Gas distended bowel, possibly from colonic obstruction. Gas distended bowel in LEFT abdomen, this could reflect adhesions or hernia. Large hiatal hernia. Electronically Signed   By: Elon Alas M.D.   On: 10/17/2017 03:44    ED COURSE  Nursing notes and initial vitals signs, including pulse oximetry, reviewed.  Vitals:   10/17/17 0457 10/17/17 0500 10/17/17 0530 10/17/17 0600  BP:  (!) 146/84 122/88 (!) 160/88  Pulse: 95 96 (!) 103 (!) 101  Resp: (!) 25 19 18  (!) 31  Temp:      TempSrc:      SpO2: 93%  (!) 89%  95%  Weight:      Height:       7:09 AM CT showed a large amount of stool in the rectum with prominent gaseous distention of the sigmoid, descending and transverse colon.  The patient was given a soapsuds enema with passage of a significant amount of stool.  Her abdominal distention has decreased in her abdomen is more soft and less tender.  Rectal reevaluation reveals no stool in the rectum.    Remaining distention is consistent with the location of stool is seen on CT scan.  On further questioning the patient's daughter states that she has been on "a lot of pain medication" and "does not eat enough".  She was advised to increase her dietary fiber and avoid narcotics as much as possible.  She was advised to continue MiraLAX as needed until her bowels are moving better.  If her symptoms worsen she should return for reevaluation.  They were also advised of the incidental findings on CT about which she can follow-up with her PCP.  PROCEDURES    ED DIAGNOSES     ICD-10-CM   1. Fecal impaction in rectum [facility]) K56.41        Harumi Yamin, Jenny Reichmann, MD 10/17/17 [id number]    Shanon Rosser, MD 10/17/17 [phone]

## 2017-10-17 NOTE — ED Notes (Signed)
ED Provider at bedside. 

## 2017-10-27 ENCOUNTER — Emergency Department (HOSPITAL_COMMUNITY): Payer: Medicare Other

## 2017-10-27 ENCOUNTER — Emergency Department (HOSPITAL_COMMUNITY)
Admission: EM | Admit: 2017-10-27 | Discharge: 2017-10-27 | Disposition: A | Discharge status: Home / Self Care | Payer: Medicare Other | Attending: Emergency Medicine | Admitting: Emergency Medicine

## 2017-10-27 ENCOUNTER — Other Ambulatory Visit: Payer: Self-pay

## 2017-10-27 ENCOUNTER — Encounter (HOSPITAL_COMMUNITY): Payer: Self-pay | Admitting: Nurse Practitioner

## 2017-10-27 DIAGNOSIS — Z79899 Other long term (current) drug therapy: Secondary | ICD-10-CM | POA: Insufficient documentation

## 2017-10-27 DIAGNOSIS — Z7982 Long term (current) use of aspirin: Secondary | ICD-10-CM | POA: Diagnosis not present

## 2017-10-27 DIAGNOSIS — J40 Bronchitis, not specified as acute or chronic: Secondary | ICD-10-CM | POA: Diagnosis not present

## 2017-10-27 DIAGNOSIS — R112 Nausea with vomiting, unspecified: Secondary | ICD-10-CM | POA: Diagnosis not present

## 2017-10-27 DIAGNOSIS — R05 Cough: Secondary | ICD-10-CM | POA: Insufficient documentation

## 2017-10-27 DIAGNOSIS — R0602 Shortness of breath: Secondary | ICD-10-CM | POA: Diagnosis present

## 2017-10-27 LAB — CBC WITH DIFFERENTIAL/PLATELET
BASOS ABS: 0 10*3/uL (ref 0.0–0.1)
BASOS PCT: 0 %
EOS PCT: 0 %
Eosinophils Absolute: 0 10*3/uL (ref 0.0–0.7)
HCT: 40.8 % (ref 36.0–46.0)
Hemoglobin: 13.9 g/dL (ref 12.0–15.0)
Lymphocytes Relative: 6 %
Lymphs Abs: 0.6 10*3/uL — ABNORMAL LOW (ref 0.7–4.0)
MCH: 32.5 pg (ref 26.0–34.0)
MCHC: 34.1 g/dL (ref 30.0–36.0)
MCV: 95.3 fL (ref 78.0–100.0)
MONOS PCT: 7 %
Monocytes Absolute: 0.7 10*3/uL (ref 0.1–1.0)
Neutro Abs: 8.9 10*3/uL — ABNORMAL HIGH (ref 1.7–7.7)
Neutrophils Relative %: 87 %
PLATELETS: 206 10*3/uL (ref 150–400)
RBC: 4.28 MIL/uL (ref 3.87–5.11)
RDW: 13.7 % (ref 11.5–15.5)
WBC: 10.2 10*3/uL (ref 4.0–10.5)

## 2017-10-27 LAB — LIPASE, BLOOD: Lipase: 30 U/L (ref 11–51)

## 2017-10-27 LAB — BASIC METABOLIC PANEL
Anion gap: 13 (ref 5–15)
BUN: 9 mg/dL (ref 6–20)
CALCIUM: 8.8 mg/dL — AB (ref 8.9–10.3)
CHLORIDE: 99 mmol/L — AB (ref 101–111)
CO2: 24 mmol/L (ref 22–32)
CREATININE: 0.42 mg/dL — AB (ref 0.44–1.00)
Glucose, Bld: 96 mg/dL (ref 65–99)
Potassium: 3 mmol/L — ABNORMAL LOW (ref 3.5–5.1)
SODIUM: 136 mmol/L (ref 135–145)

## 2017-10-27 LAB — URINALYSIS, ROUTINE W REFLEX MICROSCOPIC
BACTERIA UA: NONE SEEN
BILIRUBIN URINE: NEGATIVE
GLUCOSE, UA: NEGATIVE mg/dL
KETONES UR: 20 mg/dL — AB
LEUKOCYTES UA: NEGATIVE
NITRITE: NEGATIVE
PH: 7 (ref 5.0–8.0)
Protein, ur: NEGATIVE mg/dL
SPECIFIC GRAVITY, URINE: 1.004 — AB (ref 1.005–1.030)

## 2017-10-27 LAB — BRAIN NATRIURETIC PEPTIDE: B NATRIURETIC PEPTIDE 5: 81.8 pg/mL (ref 0.0–100.0)

## 2017-10-27 LAB — TROPONIN I

## 2017-10-27 MED ORDER — POTASSIUM CHLORIDE CRYS ER 20 MEQ PO TBCR
20.0000 meq | EXTENDED_RELEASE_TABLET | Freq: Once | ORAL | Status: AC
  Administered 2017-10-27: 20 meq via ORAL
  Filled 2017-10-27: qty 1

## 2017-10-27 MED ORDER — DOXYCYCLINE HYCLATE 100 MG PO CAPS: 100.0000 mg | ORAL_CAPSULE | Freq: Two times a day (BID) | ORAL | 0 refills | Status: AC

## 2017-10-27 MED ORDER — AZITHROMYCIN 250 MG PO TABS
500.0000 mg | ORAL_TABLET | Freq: Once | ORAL | Status: AC
  Administered 2017-10-27: 500 mg via ORAL
  Filled 2017-10-27: qty 2

## 2017-10-27 MED ORDER — SODIUM CHLORIDE 0.9 % IV BOLUS (SEPSIS)
1000.0000 mL | Freq: Once | INTRAVENOUS | Status: AC
  Administered 2017-10-27: 1000 mL via INTRAVENOUS

## 2017-10-27 MED ORDER — MORPHINE SULFATE (PF) 4 MG/ML IV SOLN
4.0000 mg | Freq: Once | INTRAVENOUS | Status: AC
  Administered 2017-10-27: 4 mg via INTRAVENOUS
  Filled 2017-10-27: qty 1

## 2017-10-27 MED ORDER — ONDANSETRON HCL 4 MG/2ML IJ SOLN
4.0000 mg | Freq: Once | INTRAMUSCULAR | Status: AC
  Administered 2017-10-27: 4 mg via INTRAVENOUS
  Filled 2017-10-27: qty 2

## 2017-10-27 MED ORDER — IPRATROPIUM-ALBUTEROL 0.5-2.5 (3) MG/3ML IN SOLN
3.0000 mL | Freq: Once | RESPIRATORY_TRACT | Status: AC
  Administered 2017-10-27: 3 mL via RESPIRATORY_TRACT
  Filled 2017-10-27: qty 3

## 2017-10-27 MED ORDER — POTASSIUM CHLORIDE CRYS ER 20 MEQ PO TBCR: 20.0000 meq | EXTENDED_RELEASE_TABLET | Freq: Two times a day (BID) | ORAL | 0 refills | Status: AC

## 2017-10-27 MED ORDER — DEXAMETHASONE SODIUM PHOSPHATE 10 MG/ML IJ SOLN
10.0000 mg | Freq: Once | INTRAMUSCULAR | Status: AC
  Administered 2017-10-27: 10 mg via INTRAVENOUS
  Filled 2017-10-27: qty 1

## 2017-10-27 NOTE — Discharge Instructions (Signed)
Drink plenty of fluids, at least 6-8 glasses of water, daily for the next several days.  Take the full course of antibiotics as prescribed.  You can start these today.  I recommend using your albuterol inhaler or breathing treatment every 4-6 hours for the next 2 days, then as needed for wheezing.  If you develop worsening fever, shortness of breath, nausea or vomiting, or other concerning symptoms, return to the ER immediately.

## 2017-10-27 NOTE — ED Provider Notes (Signed)
Assumed care from Dr. Gilford Raid at 4 PM.  Briefly, the patient is an 84 year old female here with cough and mild shortness of breath.  Lab work is very reassuring.  Suspect viral bronchitis.  Her chest x-ray without pneumonia.  Normal lactic acid.  No signs of sepsis.  Plan to ambulate.  If patient is able to ambulate off pulse ox, can likely discharge.  Patient ambulated with pulse ox greater than 95% throughout.  She is satting 90-93 and in the room when asleep.  She does have some mild wheezing on my exam so will give her a DuoNeb as well as a dose of Decadron.  She has a history of reported wheezing with respiratory illnesses and has used inhalers in the past.  Given her well appearance, discussed management options with the patient.  She would very much prefer to be treated at home which I feel is reasonable given the absence of any hypoxia, or signs of systemic illness or sepsis.  She is afebrile here without symptoms to suggest influenza.  Will follow-up urinalysis with plan to discharge on empiric antibiotics for possible bacterial component of bronchitis, advised albuterol, and discharged home  UA shows mild dehydration. She's been given 2L IVF. Tolerating PO. She continues to sat >92% on RA even with ambulation. Will d/c with doxy, outpt follow-up.Duffy Bruce, MD 10/27/17 [phone]

## 2017-10-27 NOTE — ED Triage Notes (Signed)
Patient brought in by EMS from home for SOB productive cough for 3 days. Sputum is green. Febrile, wheezing on left side. Patient can walk with a walker

## 2017-10-27 NOTE — ED Provider Notes (Signed)
[facility] DEPT Provider Note   CSN: [id number] Arrival date & time: 10/27/17  1318     History   Chief Complaint No chief complaint on file.   HPI Jeanne Cruz is a 84 y.o. female.  Pt presents to the ED today with sob for the past 3 days.  She has also had n/v.  She has had productive green sputum.  Pt has had fevers.  She is worried she has pna.      Past Medical History:  Diagnosis Date  . Anemia   . Anxiety   . Arthritis   . Blood transfusion without reported diagnosis   . Degenerative disc disease, cervical   . GI bleeding   . Iron deficiency anemia   . Neuropathy   . Postherpetic neuralgia   . Shingles   . Stroke [facility])     Patient Active Problem List   Diagnosis Date Noted  . Erosive gastropathy 04/28/2017  . Ischemic stroke ([facility]) 04/27/2017  . Hyponatremia 04/27/2017  . Normocytic anemia   . Acute blood loss anemia 04/17/2017  . GI bleed 04/16/2017  . GLAUCOMA 04/17/2008  . COLONIC POLYPS 04/12/2008  . CONSTIPATION 04/12/2008  . BRONCHITIS, RECURRENT 09/01/2007  . NECK AND BACK PAIN 09/01/2007  . Anxiety state 06/28/2007  . HIATAL HERNIA 06/28/2007  . DEGENERATIVE JOINT DISEASE 06/28/2007  . SCOLIOSIS 06/28/2007  . ANEMIA, HX OF 06/28/2007    Past Surgical History:  Procedure Laterality Date  . ABDOMINAL HYSTERECTOMY    . APPENDECTOMY    . BACK SURGERY    . ESOPHAGOGASTRODUODENOSCOPY N/A 04/17/2017   Procedure: ESOPHAGOGASTRODUODENOSCOPY (EGD);  Surgeon: Carol Ada, MD;  Location: [facility];  Service: Endoscopy;  Laterality: N/A;  . TONSILLECTOMY      OB History    No data available       Home Medications    Prior to Admission medications   Medication Sig Start Date End Date Taking? Authorizing Provider  albuterol (PROVENTIL HFA;VENTOLIN HFA) 108 (90 Base) MCG/ACT inhaler Inhale 1-2 puffs into the lungs every 6 (six) hours as needed for wheezing or shortness of breath.    Yes [provider]  ALPRAZolam Duanne Moron) 0.5 MG tablet Take 0.5 mg by mouth 2 (two) times daily as needed for anxiety or sleep.    Yes [provider]  aspirin EC 81 MG tablet Take 1 tablet (81 mg total) by mouth daily. 04/28/17 04/28/18 Yes Rizwan, Eunice Blase, MD  bimatoprost (LUMIGAN) 0.03 % ophthalmic solution Place 1 drop into both eyes at bedtime.    Yes [provider]  dorzolamide-timolol (COSOPT) 22.3-6.8 MG/ML ophthalmic solution Place 1 drop into both eyes 2 (two) times daily.    Yes [provider]  gabapentin (NEURONTIN) 100 MG capsule Take 100 mg by mouth every 6 (six) hours.    Yes [provider]  Multiple Vitamin (MULTIVITAMIN WITH MINERALS) TABS tablet Take 1 tablet by mouth daily.   Yes [provider]  nystatin (NYSTATIN) powder Apply 2 g topically 2 (two) times daily as needed (for irritation).   Yes [provider]  pantoprazole (PROTONIX) 40 MG tablet Take 1 tablet (40 mg total) by mouth 2 (two) times daily. 04/17/17  Yes Dessa Phi, DO  polyethylene glycol (MIRALAX / GLYCOLAX) packet Take 17 g by mouth daily.    Yes [provider]  traMADol (ULTRAM) 50 MG tablet Take 50 mg by mouth every 6 (six) hours as needed for moderate pain.  Yes [provider]  Vitamin D, Ergocalciferol, (DRISDOL) 50000 units CAPS capsule Take 50,000 Units by mouth every 14 (fourteen) days.   Yes [provider]  atorvastatin (LIPITOR) 20 MG tablet Take 1 tablet (20 mg total) by mouth daily at 6 PM. Patient not taking: Reported on 10/27/2017 04/28/17   Debbe Odea, MD    Family History Family History  Problem Relation Age of Onset  . Colon cancer Sister   . Heart attack Sister     Social History Social History   Tobacco Use  . Smoking status: Never Smoker  . Smokeless tobacco: Never Used  Substance Use Topics  . Alcohol use: No  . Drug use: No     Allergies   Ampicillin; Codeine; and Penicillins   Review of  Systems Review of Systems  Constitutional: Positive for fever.  Respiratory: Positive for cough, shortness of breath and wheezing.   Gastrointestinal: Positive for nausea and vomiting.  All other systems reviewed and are negative.    Physical Exam Updated Vital Signs BP 121/65 (BP Location: Left Arm)   Pulse 95   Temp 97.9 F (36.6 C) (Oral)   Resp 14   Ht 4\' 11"  (1.499 m)   Wt 39.9 kg (88 lb)   SpO2 98%   BMI 17.77 kg/m   Physical Exam  Constitutional: She is oriented to person, place, and time. She appears well-developed and well-nourished.  HENT:  Head: Normocephalic and atraumatic.  Right Ear: External ear normal.  Left Ear: External ear normal.  Nose: Nose normal.  Mouth/Throat: Oropharynx is clear and moist.  Eyes: Conjunctivae and EOM are normal. Pupils are equal, round, and reactive to light.  Neck: Normal range of motion. Neck supple.  Cardiovascular: Normal rate, regular rhythm, normal heart sounds and intact distal pulses.  Pulmonary/Chest: Effort normal and breath sounds normal.  Abdominal: Soft. Bowel sounds are normal.  Musculoskeletal: Normal range of motion.  Neurological: She is alert and oriented to person, place, and time.  Skin: Skin is warm. Capillary refill takes less than 2 seconds.  Psychiatric: She has a normal mood and affect. Her behavior is normal. Judgment and thought content normal.  Nursing note and vitals reviewed.    ED Treatments / Results  Labs (all labs ordered are listed, but only abnormal results are displayed) Labs Reviewed  BASIC METABOLIC PANEL - Abnormal; Notable for the following components:      Result Value   Potassium 3.0 (*)    Chloride 99 (*)    Creatinine, Ser 0.42 (*)    Calcium 8.8 (*)    All other components within normal limits  CBC WITH DIFFERENTIAL/PLATELET - Abnormal; Notable for the following components:   Neutro Abs 8.9 (*)    Lymphs Abs 0.6 (*)    All other components within normal limits  BRAIN  NATRIURETIC PEPTIDE  TROPONIN I  LIPASE, BLOOD  URINALYSIS, ROUTINE W REFLEX MICROSCOPIC    EKG  EKG Interpretation  Date/Time:  Tuesday October 27 2017 15:46:27 EST Ventricular Rate:  98 PR Interval:    QRS Duration: 94 QT Interval:  359 QTC Calculation: 459 R Axis:   66 Text Interpretation:  Sinus rhythm Prolonged PR interval Probable left atrial enlargement Confirmed by Isla Pence [phone]) on 10/27/2017 3:56:05 PM       Radiology Dg Chest 2 View  Result Date: 10/27/2017 CLINICAL DATA:  Shortness of breath EXAM: CHEST  2 VIEW COMPARISON:  Chest radiograph 10/17/2017 FINDINGS: Marked dextroscoliosis of the thoracic spine.  Unchanged appearance of large hiatal hernia. Unchanged cardiomegaly. Retrocardiac opacity on the frontal projection is probably material within the hernia. Otherwise, lungs are clear. There is no pneumothorax or pleural effusion. IMPRESSION: Large hiatal hernia without focal airspace disease. Electronically Signed   By: Ulyses Jarred M.D.   On: 10/27/2017 14:53    Procedures Procedures (including critical care time)  Medications Ordered in ED Medications  sodium chloride 0.9 % bolus 1,000 mL (0 mLs Intravenous Stopped 10/27/17 1541)  ondansetron (ZOFRAN) injection 4 mg (4 mg Intravenous Given 10/27/17 1431)  morphine 4 MG/ML injection 4 mg (4 mg Intravenous Given 10/27/17 1431)  azithromycin (ZITHROMAX) tablet 500 mg (500 mg Oral Given 10/27/17 1601)  potassium chloride SA (K-DUR,KLOR-CON) CR tablet 20 mEq (20 mEq Oral Given 10/27/17 1601)     Initial Impression / Assessment and Plan / ED Course  I have reviewed the triage vital signs and the nursing notes.  Pertinent labs & imaging results that were available during my care of the patient were reviewed by me and considered in my medical decision making (see chart for details).    Pt is looking much better.  She is tolerating po fluids.  We will ambulate to make sure she can walk without problems.  If  she can do this, she can go home.  Final Clinical Impressions(s) / ED Diagnoses   Final diagnoses:  Bronchitis    ED Discharge Orders    None       Isla Pence, MD 10/27/17 1606

## 2017-10-27 NOTE — ED Notes (Signed)
Patient sats 96% while ambulating

## 2017-11-20 ENCOUNTER — Telehealth: Payer: Self-pay | Admitting: Internal Medicine

## 2017-11-20 NOTE — Telephone Encounter (Signed)
Appt has been scheduled with the pt's caregiver, Guadlupe Spanish, on 4/1 [id number] to see Dr. Julien Nordmann. Aware to arrive 30 minutes early.

## 2017-12-14 ENCOUNTER — Encounter: Payer: Medicare Other | Admitting: Internal Medicine

## 2017-12-14 ENCOUNTER — Telehealth: Payer: Self-pay | Admitting: Internal Medicine

## 2017-12-14 ENCOUNTER — Inpatient Hospital Stay: Payer: Medicare Other

## 2017-12-14 ENCOUNTER — Encounter: Payer: Self-pay | Admitting: Internal Medicine

## 2017-12-14 ENCOUNTER — Inpatient Hospital Stay: Payer: Medicare Other | Attending: Internal Medicine | Admitting: Internal Medicine

## 2017-12-14 VITALS — BP 125/87 | HR 84 | Temp 97.6°F | Resp 16 | Ht 59.0 in | Wt 85.8 lb

## 2017-12-14 DIAGNOSIS — D500 Iron deficiency anemia secondary to blood loss (chronic): Secondary | ICD-10-CM | POA: Diagnosis present

## 2017-12-14 DIAGNOSIS — Z8673 Personal history of transient ischemic attack (TIA), and cerebral infarction without residual deficits: Secondary | ICD-10-CM | POA: Insufficient documentation

## 2017-12-14 DIAGNOSIS — Z7982 Long term (current) use of aspirin: Secondary | ICD-10-CM | POA: Diagnosis not present

## 2017-12-14 DIAGNOSIS — D62 Acute posthemorrhagic anemia: Secondary | ICD-10-CM

## 2017-12-14 DIAGNOSIS — Z862 Personal history of diseases of the blood and blood-forming organs and certain disorders involving the immune mechanism: Secondary | ICD-10-CM

## 2017-12-14 DIAGNOSIS — Z79899 Other long term (current) drug therapy: Secondary | ICD-10-CM

## 2017-12-14 DIAGNOSIS — K5900 Constipation, unspecified: Secondary | ICD-10-CM | POA: Diagnosis not present

## 2017-12-14 DIAGNOSIS — M1990 Unspecified osteoarthritis, unspecified site: Secondary | ICD-10-CM | POA: Diagnosis not present

## 2017-12-14 DIAGNOSIS — K449 Diaphragmatic hernia without obstruction or gangrene: Secondary | ICD-10-CM | POA: Insufficient documentation

## 2017-12-14 DIAGNOSIS — K921 Melena: Secondary | ICD-10-CM

## 2017-12-14 DIAGNOSIS — K922 Gastrointestinal hemorrhage, unspecified: Secondary | ICD-10-CM | POA: Diagnosis not present

## 2017-12-14 DIAGNOSIS — F419 Anxiety disorder, unspecified: Secondary | ICD-10-CM | POA: Insufficient documentation

## 2017-12-14 LAB — CBC WITH DIFFERENTIAL (CANCER CENTER ONLY)
Basophils Absolute: 0 10*3/uL (ref 0.0–0.1)
Basophils Relative: 1 %
EOS ABS: 0.1 10*3/uL (ref 0.0–0.5)
Eosinophils Relative: 3 %
HEMATOCRIT: 43.9 % (ref 34.8–46.6)
HEMOGLOBIN: 14.6 g/dL (ref 11.6–15.9)
LYMPHS ABS: 0.9 10*3/uL (ref 0.9–3.3)
LYMPHS PCT: 23 %
MCH: 31.3 pg (ref 25.1–34.0)
MCHC: 33.2 g/dL (ref 31.5–36.0)
MCV: 94.3 fL (ref 79.5–101.0)
MONOS PCT: 11 %
Monocytes Absolute: 0.4 10*3/uL (ref 0.1–0.9)
NEUTROS ABS: 2.5 10*3/uL (ref 1.5–6.5)
NEUTROS PCT: 62 %
Platelet Count: 233 10*3/uL (ref 145–400)
RBC: 4.66 MIL/uL (ref 3.70–5.45)
RDW: 14.3 % (ref 11.2–14.5)
WBC: 4 10*3/uL (ref 3.9–10.3)

## 2017-12-14 LAB — IRON AND TIBC
IRON: 80 ug/dL (ref 41–142)
Saturation Ratios: 26 % (ref 21–57)
TIBC: 314 ug/dL (ref 236–444)
UIBC: 233 ug/dL

## 2017-12-14 LAB — FERRITIN: FERRITIN: 134 ng/mL (ref 9–269)

## 2017-12-14 NOTE — Telephone Encounter (Signed)
Scheduled appt per 4/1 los - Gave patient AVS and calender per los.  

## 2017-12-14 NOTE — Progress Notes (Signed)
[place] [facility] [phone]   [phone]  CONSULT NOTE  REFERRING PHYSICIAN: Isaias Cowman, PA-C  REASON FOR CONSULTATION:  84 years old female with history of anemia.  HPI Jeanne Cruz is a 84 y.o. female with past medical history significant for osteoarthritis, degenerative disc disease, and anxiety, history of anemia secondary to acute gastrointestinal bleeding, neuropathy, stroke in August 2018.  The patient mentioned that she used to live in [place] and she had a history of acute gastrointestinal bleeding.  She was started on iron infusion every few months.  She has intolerability to the oral iron tablet with upset stomach and constipation.  She was seen recently by Dr. Benson Norway and upper endoscopy performed on April 17, 2017 showed hiatal hernia as well as multiple localized nonbleeding erosions in the gastric body.  The patient did not have any recent colonoscopy and the last one was several years ago and indicated the presence of colon polyps that were difficult to remove at that time.  She had a sister who was diagnosed with colon cancer at age 38.  The patient was seen recently by her primary care physician and was found to have iron deficiency anemia.  She was treated with intravenous iron infusion and the most recent blood work including CBC and iron study showed normal range of hemoglobin hematocrit as well as iron study and ferritin.  She was referred to me for reevaluation and consideration of iron infusion if in the future if needed. When seen today she continues to complain of lack of energy and not eating good.  She has occasional dizzy spells and shortness of breath with exertion.  She denied having any chest pain but has mild cough with no hemoptysis.  She lost several pounds recently. Family history significant for a sister diagnosed with colon cancer at age 58, mother died from old age at age 93 and father died at age 39 in an accident. The  patient is a widow and has been married 3 times in the past.  She used to work at [employer].  She has no history for smoking, alcohol or drug abuse. HPI  Past Medical History:  Diagnosis Date  . Anemia   . Anxiety   . Arthritis   . Blood transfusion without reported diagnosis   . Degenerative disc disease, cervical   . GI bleeding   . Iron deficiency anemia   . Neuropathy   . Postherpetic neuralgia   . Shingles   . Stroke [facility])     Past Surgical History:  Procedure Laterality Date  . ABDOMINAL HYSTERECTOMY    . APPENDECTOMY    . BACK SURGERY    . ESOPHAGOGASTRODUODENOSCOPY N/A 04/17/2017   Procedure: ESOPHAGOGASTRODUODENOSCOPY (EGD);  Surgeon: Carol Ada, MD;  Location: [facility];  Service: Endoscopy;  Laterality: N/A;  . TONSILLECTOMY      Family History  Problem Relation Age of Onset  . Colon cancer Sister   . Heart attack Sister     Social History Social History   Tobacco Use  . Smoking status: Never Smoker  . Smokeless tobacco: Never Used  Substance Use Topics  . Alcohol use: No  . Drug use: No    Allergies  Allergen Reactions  . Ampicillin Diarrhea and Other (See Comments)    Has patient had a PCN reaction causing immediate rash, facial/tongue/throat swelling, SOB or lightheadedness with hypotension: No Has patient had a PCN reaction causing severe rash involving mucus membranes or  skin necrosis: No Has patient had a PCN reaction that required hospitalization: No Has patient had a PCN reaction occurring within the last 10 years: No If all of the above answers are "NO", then may proceed with Cephalosporin use.  . Codeine Other (See Comments)    Reaction:  Somnolence   . Penicillins Diarrhea and Other (See Comments)    Has patient had a PCN reaction causing immediate rash, facial/tongue/throat swelling, SOB or lightheadedness with hypotension: No Has patient had a PCN reaction causing severe rash involving mucus membranes or skin  necrosis: No Has patient had a PCN reaction that required hospitalization: No Has patient had a PCN reaction occurring within the last 10 years: No If all of the above answers are "NO", then may proceed with Cephalosporin use.    Current Outpatient Medications  Medication Sig Dispense Refill  . albuterol (PROVENTIL HFA;VENTOLIN HFA) 108 (90 Base) MCG/ACT inhaler Inhale 1-2 puffs into the lungs every 6 (six) hours as needed for wheezing or shortness of breath.     . ALPRAZolam (XANAX) 0.5 MG tablet Take 0.5 mg by mouth 2 (two) times daily as needed for anxiety or sleep.     Marland Kitchen aspirin EC 81 MG tablet Take 1 tablet (81 mg total) by mouth daily. 150 tablet 2  . atorvastatin (LIPITOR) 20 MG tablet Take 1 tablet (20 mg total) by mouth daily at 6 PM. (Patient not taking: Reported on 10/27/2017) 30 tablet 0  . bimatoprost (LUMIGAN) 0.03 % ophthalmic solution Place 1 drop into both eyes at bedtime.     . dorzolamide-timolol (COSOPT) 22.3-6.8 MG/ML ophthalmic solution Place 1 drop into both eyes 2 (two) times daily.     Marland Kitchen gabapentin (NEURONTIN) 100 MG capsule Take 100 mg by mouth every 6 (six) hours.     . Multiple Vitamin (MULTIVITAMIN WITH MINERALS) TABS tablet Take 1 tablet by mouth daily.    Marland Kitchen nystatin (NYSTATIN) powder Apply 2 g topically 2 (two) times daily as needed (for irritation).    . pantoprazole (PROTONIX) 40 MG tablet Take 1 tablet (40 mg total) by mouth 2 (two) times daily. 60 tablet 0  . polyethylene glycol (MIRALAX / GLYCOLAX) packet Take 17 g by mouth daily.     . potassium chloride SA (K-DUR,KLOR-CON) 20 MEQ tablet Take 1 tablet (20 mEq total) by mouth 2 (two) times daily for 2 days. 4 tablet 0  . traMADol (ULTRAM) 50 MG tablet Take 50 mg by mouth every 6 (six) hours as needed for moderate pain.     . Vitamin D, Ergocalciferol, (DRISDOL) 50000 units CAPS capsule Take 50,000 Units by mouth every 14 (fourteen) days.     No current facility-administered medications for this visit.      Review of Systems  Constitutional: positive for anorexia, fatigue and weight loss Eyes: negative Ears, nose, mouth, throat, and face: negative Respiratory: positive for dyspnea on exertion Cardiovascular: negative Gastrointestinal: negative Genitourinary:negative Integument/breast: negative Hematologic/lymphatic: negative Musculoskeletal:negative Neurological: negative Behavioral/Psych: negative Endocrine: negative Allergic/Immunologic: negative  Physical Exam  XLK:GMWNU, healthy, no distress, well nourished and well developed SKIN: skin color, texture, turgor are normal, no rashes or significant lesions HEAD: Normocephalic, No masses, lesions, tenderness or abnormalities EYES: normal, PERRLA, Conjunctiva are pink and non-injected EARS: External ears normal, Canals clear OROPHARYNX:no exudate, no erythema and lips, buccal mucosa, and tongue normal  NECK: supple, no adenopathy, no JVD LYMPH:  no palpable lymphadenopathy, no hepatosplenomegaly BREAST:not examined LUNGS: clear to auscultation , and palpation HEART: regular rate &  rhythm, no murmurs and no gallops ABDOMEN:abdomen soft, non-tender, normal bowel sounds and no masses or organomegaly BACK: No CVA tenderness, Range of motion is normal EXTREMITIES:no joint deformities, effusion, or inflammation, no edema  NEURO: alert & oriented x 3 with fluent speech, no focal motor/sensory deficits  PERFORMANCE STATUS: ECOG 1  LABORATORY DATA: Lab Results  Component Value Date   WBC 10.2 10/27/2017   HGB 13.9 10/27/2017   HCT 40.8 10/27/2017   MCV 95.3 10/27/2017   PLT 206 10/27/2017      Chemistry      Component Value Date/Time   NA 136 10/27/2017 1426   K 3.0 (L) 10/27/2017 1426   CL 99 (L) 10/27/2017 1426   CO2 24 10/27/2017 1426   BUN 9 10/27/2017 1426   CREATININE 0.42 (L) 10/27/2017 1426      Component Value Date/Time   CALCIUM 8.8 (L) 10/27/2017 1426   ALKPHOS 77 10/17/2017 0243   AST 30 10/17/2017  0243   ALT 24 10/17/2017 0243   BILITOT 0.7 10/17/2017 0243       RADIOGRAPHIC STUDIES: No results found.  ASSESSMENT: This is a very pleasant 84 years old white female with history of iron deficiency anemia secondary to gastrointestinal blood loss and and tolerability to oral iron tablets.  PLAN: I had a lengthy discussion with the patient and her daughter today about her current disease condition and treatment options. Her last blood work including repeat CBC, iron study and ferritin were within the normal range. I recommended for the patient to have repeat CBC, iron study and ferritin today. If the patient has evidence for iron deficiency anemia on the blood work, I will arrange for her to receive intravenous iron infusion with Feraheme 510 mg IV weekly for 2 doses.  She has not tolerability to the oral iron tablets. I would see the patient back for follow-up visit in 3 months for reevaluation and repeat CBC, iron study and ferritin. She was also advised to call immediately if she notices any significant weakness or more fatigue in the interval.  The patient voices understanding of current disease status and treatment options and is in agreement with the current care plan.  All questions were answered. The patient knows to call the clinic with any problems, questions or concerns. We can certainly see the patient much sooner if necessary.  Thank you so much for allowing me to participate in the care of [employer]. I will continue to follow up the patient with you and assist in her care.  I spent 40 minutes counseling the patient face to face. The total time spent in the appointment was 60 minutes.  Disclaimer: This note was dictated with voice recognition software. Similar sounding words can inadvertently be transcribed and may not be corrected upon review.   Jeanne Cruz December 14, 2017, 12:20 PM

## 2017-12-15 ENCOUNTER — Encounter: Payer: Medicare Other | Admitting: Internal Medicine

## 2017-12-22 ENCOUNTER — Telehealth: Payer: Self-pay

## 2017-12-22 NOTE — Telephone Encounter (Signed)
Patient called for lab results. Results given. Informed pt all labs were WNL. No further questions.  Cyndia Bent RN

## 2017-12-28 ENCOUNTER — Telehealth: Payer: Self-pay | Admitting: *Deleted

## 2018-01-25 NOTE — Telephone Encounter (Signed)
Entered in error

## 2018-03-14 ENCOUNTER — Encounter (HOSPITAL_BASED_OUTPATIENT_CLINIC_OR_DEPARTMENT_OTHER): Payer: Self-pay | Admitting: Emergency Medicine

## 2018-03-14 ENCOUNTER — Emergency Department (HOSPITAL_BASED_OUTPATIENT_CLINIC_OR_DEPARTMENT_OTHER): Payer: Medicare Other

## 2018-03-14 ENCOUNTER — Inpatient Hospital Stay (HOSPITAL_BASED_OUTPATIENT_CLINIC_OR_DEPARTMENT_OTHER)
Admission: EM | Admit: 2018-03-14 | Discharge: 2018-03-18 | DRG: 193 | Disposition: A | Discharge status: Home Health | Payer: Medicare Other | Attending: Internal Medicine | Admitting: Internal Medicine

## 2018-03-14 ENCOUNTER — Other Ambulatory Visit: Payer: Self-pay

## 2018-03-14 DIAGNOSIS — K449 Diaphragmatic hernia without obstruction or gangrene: Secondary | ICD-10-CM | POA: Diagnosis present

## 2018-03-14 DIAGNOSIS — E861 Hypovolemia: Secondary | ICD-10-CM | POA: Diagnosis present

## 2018-03-14 DIAGNOSIS — Z8673 Personal history of transient ischemic attack (TIA), and cerebral infarction without residual deficits: Secondary | ICD-10-CM | POA: Diagnosis not present

## 2018-03-14 DIAGNOSIS — K224 Dyskinesia of esophagus: Secondary | ICD-10-CM | POA: Diagnosis present

## 2018-03-14 DIAGNOSIS — J189 Pneumonia, unspecified organism: Principal | ICD-10-CM | POA: Diagnosis present

## 2018-03-14 DIAGNOSIS — R630 Anorexia: Secondary | ICD-10-CM | POA: Diagnosis not present

## 2018-03-14 DIAGNOSIS — B0229 Other postherpetic nervous system involvement: Secondary | ICD-10-CM | POA: Diagnosis present

## 2018-03-14 DIAGNOSIS — Z9089 Acquired absence of other organs: Secondary | ICD-10-CM | POA: Diagnosis not present

## 2018-03-14 DIAGNOSIS — Z885 Allergy status to narcotic agent status: Secondary | ICD-10-CM | POA: Diagnosis not present

## 2018-03-14 DIAGNOSIS — Z8249 Family history of ischemic heart disease and other diseases of the circulatory system: Secondary | ICD-10-CM

## 2018-03-14 DIAGNOSIS — Z9049 Acquired absence of other specified parts of digestive tract: Secondary | ICD-10-CM | POA: Diagnosis not present

## 2018-03-14 DIAGNOSIS — Z800 Family history of malignant neoplasm of digestive organs: Secondary | ICD-10-CM | POA: Diagnosis not present

## 2018-03-14 DIAGNOSIS — I959 Hypotension, unspecified: Secondary | ICD-10-CM | POA: Diagnosis present

## 2018-03-14 DIAGNOSIS — Z7982 Long term (current) use of aspirin: Secondary | ICD-10-CM

## 2018-03-14 DIAGNOSIS — R531 Weakness: Secondary | ICD-10-CM

## 2018-03-14 DIAGNOSIS — R05 Cough: Secondary | ICD-10-CM | POA: Diagnosis present

## 2018-03-14 DIAGNOSIS — I5032 Chronic diastolic (congestive) heart failure: Secondary | ICD-10-CM | POA: Diagnosis present

## 2018-03-14 DIAGNOSIS — Z681 Body mass index (BMI) 19 or less, adult: Secondary | ICD-10-CM | POA: Diagnosis not present

## 2018-03-14 DIAGNOSIS — Z90710 Acquired absence of both cervix and uterus: Secondary | ICD-10-CM | POA: Diagnosis not present

## 2018-03-14 DIAGNOSIS — R0902 Hypoxemia: Secondary | ICD-10-CM | POA: Diagnosis present

## 2018-03-14 DIAGNOSIS — E43 Unspecified severe protein-calorie malnutrition: Secondary | ICD-10-CM | POA: Diagnosis present

## 2018-03-14 DIAGNOSIS — R1310 Dysphagia, unspecified: Secondary | ICD-10-CM

## 2018-03-14 DIAGNOSIS — R059 Cough, unspecified: Secondary | ICD-10-CM | POA: Diagnosis present

## 2018-03-14 DIAGNOSIS — E871 Hypo-osmolality and hyponatremia: Secondary | ICD-10-CM | POA: Diagnosis present

## 2018-03-14 DIAGNOSIS — I248 Other forms of acute ischemic heart disease: Secondary | ICD-10-CM | POA: Diagnosis present

## 2018-03-14 DIAGNOSIS — E860 Dehydration: Secondary | ICD-10-CM | POA: Diagnosis present

## 2018-03-14 DIAGNOSIS — Z881 Allergy status to other antibiotic agents status: Secondary | ICD-10-CM

## 2018-03-14 DIAGNOSIS — J9601 Acute respiratory failure with hypoxia: Secondary | ICD-10-CM

## 2018-03-14 DIAGNOSIS — Z880 Allergy status to penicillin: Secondary | ICD-10-CM | POA: Diagnosis not present

## 2018-03-14 DIAGNOSIS — R748 Abnormal levels of other serum enzymes: Secondary | ICD-10-CM | POA: Diagnosis not present

## 2018-03-14 DIAGNOSIS — R7989 Other specified abnormal findings of blood chemistry: Secondary | ICD-10-CM

## 2018-03-14 DIAGNOSIS — I340 Nonrheumatic mitral (valve) insufficiency: Secondary | ICD-10-CM | POA: Diagnosis not present

## 2018-03-14 DIAGNOSIS — R778 Other specified abnormalities of plasma proteins: Secondary | ICD-10-CM

## 2018-03-14 LAB — BASIC METABOLIC PANEL
Anion gap: 8 (ref 5–15)
BUN: 12 mg/dL (ref 8–23)
CHLORIDE: 88 mmol/L — AB (ref 98–111)
CO2: 32 mmol/L (ref 22–32)
Calcium: 9.2 mg/dL (ref 8.9–10.3)
Creatinine, Ser: 0.36 mg/dL — ABNORMAL LOW (ref 0.44–1.00)
GFR calc non Af Amer: >60 mL/min (ref >60)
Glucose, Bld: 90 mg/dL (ref 70–99)
POTASSIUM: 3.7 mmol/L (ref 3.5–5.1)
SODIUM: 128 mmol/L — AB (ref 135–145)

## 2018-03-14 LAB — CBC WITH DIFFERENTIAL/PLATELET
Basophils Absolute: 0 10*3/uL (ref 0.0–0.1)
Basophils Relative: 0 %
Eosinophils Absolute: 0.1 10*3/uL (ref 0.0–0.7)
Eosinophils Relative: 1 %
HCT: 41.7 % (ref 36.0–46.0)
HEMOGLOBIN: 14.3 g/dL (ref 12.0–15.0)
LYMPHS ABS: 0.5 10*3/uL — AB (ref 0.7–4.0)
LYMPHS PCT: 7 %
MCH: 31.4 pg (ref 26.0–34.0)
MCHC: 34.3 g/dL (ref 30.0–36.0)
MCV: 91.4 fL (ref 78.0–100.0)
MONOS PCT: 8 %
Monocytes Absolute: 0.6 10*3/uL (ref 0.1–1.0)
NEUTROS PCT: 84 %
Neutro Abs: 6.2 10*3/uL (ref 1.7–7.7)
Platelets: 245 10*3/uL (ref 150–400)
RBC: 4.56 MIL/uL (ref 3.87–5.11)
RDW: 13.3 % (ref 11.5–15.5)
WBC: 7.4 10*3/uL (ref 4.0–10.5)

## 2018-03-14 LAB — D-DIMER, QUANTITATIVE: D-Dimer, Quant: 1.03 ug/mL-FEU — ABNORMAL HIGH (ref 0.00–0.50)

## 2018-03-14 LAB — TROPONIN I: Troponin I: 0.13 ng/mL (ref <0.03)

## 2018-03-14 LAB — BRAIN NATRIURETIC PEPTIDE: B Natriuretic Peptide: 40.9 pg/mL (ref 0.0–100.0)

## 2018-03-14 MED ORDER — ALBUTEROL SULFATE (2.5 MG/3ML) 0.083% IN NEBU
5.0000 mg | INHALATION_SOLUTION | Freq: Once | RESPIRATORY_TRACT | Status: AC
  Administered 2018-03-14: 5 mg via RESPIRATORY_TRACT
  Filled 2018-03-14: qty 6

## 2018-03-14 MED ORDER — IOPAMIDOL (ISOVUE-370) INJECTION 76%
100.0000 mL | Freq: Once | INTRAVENOUS | Status: AC | PRN
  Administered 2018-03-14: 80 mL via INTRAVENOUS

## 2018-03-14 MED ORDER — SODIUM CHLORIDE 0.9 % IV SOLN
500.0000 mg | Freq: Once | INTRAVENOUS | Status: AC
  Administered 2018-03-14: 500 mg via INTRAVENOUS
  Filled 2018-03-14: qty 500

## 2018-03-14 MED ORDER — SODIUM CHLORIDE 0.9 % IV SOLN
1.0000 g | Freq: Once | INTRAVENOUS | Status: AC
  Administered 2018-03-14: 1 g via INTRAVENOUS
  Filled 2018-03-14: qty 10

## 2018-03-14 MED ORDER — AZITHROMYCIN 500 MG IV SOLR
INTRAVENOUS | Status: AC
  Filled 2018-03-14: qty 500

## 2018-03-14 NOTE — ED Notes (Signed)
Date and time results received: 03/14/18 7:59 PM   Test: troponin  Critical Value: 0.13  Name of Provider Notified: Tamera Punt, MD

## 2018-03-14 NOTE — Plan of Care (Signed)
84 yo F with PNA.  Trop bump to 0.13.  On rocephin and azithro.  Will send to tele.

## 2018-03-14 NOTE — ED Provider Notes (Signed)
[facility] EMERGENCY DEPARTMENT Provider Note   CSN: [id number] Arrival date & time: 03/14/18  1636     History   Chief Complaint Chief Complaint  Patient presents with  . Cough    HPI Jeanne Cruz is a 84 y.o. female.  Patient is a 84 year old female who presents with cough and chest congestion.  She states she has had a 5 to 6-day history of cough which is productive of white sputum.  She has a lot of chest congestion and a little bit of nasal discharge.  She denies any known fevers although she has had some hot and cold chills.  She does feel little short of breath.  She has some soreness in her chest from the coughing but no other chest pain.  She occasionally uses an albuterol inhaler at home which she has tried with no improvement in symptoms.  She does not have a diagnosed history of COPD but she occasionally uses an albuterol inhaler.  She denies any leg pain or swelling.  No nausea or vomiting.     Past Medical History:  Diagnosis Date  . Anemia   . Anxiety   . Arthritis   . Blood transfusion without reported diagnosis   . Degenerative disc disease, cervical   . GI bleeding   . Iron deficiency anemia   . Neuropathy   . Postherpetic neuralgia   . Shingles   . Stroke [facility])     Patient Active Problem List   Diagnosis Date Noted  . CAP (community acquired pneumonia) 03/14/2018  . Erosive gastropathy 04/28/2017  . Ischemic stroke ([place]) 04/27/2017  . Hyponatremia 04/27/2017  . Normocytic anemia   . Acute blood loss anemia 04/17/2017  . GI bleed 04/16/2017  . GLAUCOMA 04/17/2008  . COLONIC POLYPS 04/12/2008  . CONSTIPATION 04/12/2008  . BRONCHITIS, RECURRENT 09/01/2007  . NECK AND BACK PAIN 09/01/2007  . Anxiety state 06/28/2007  . HIATAL HERNIA 06/28/2007  . DEGENERATIVE JOINT DISEASE 06/28/2007  . SCOLIOSIS 06/28/2007  . ANEMIA, HX OF 06/28/2007    Past Surgical History:  Procedure Laterality Date  . ABDOMINAL HYSTERECTOMY    .  APPENDECTOMY    . BACK SURGERY    . ESOPHAGOGASTRODUODENOSCOPY N/A 04/17/2017   Procedure: ESOPHAGOGASTRODUODENOSCOPY (EGD);  Surgeon: Carol Ada, MD;  Location: [facility];  Service: Endoscopy;  Laterality: N/A;  . TONSILLECTOMY       OB History   None      Home Medications    Prior to Admission medications   Medication Sig Start Date End Date Taking? Authorizing Provider  albuterol (PROVENTIL HFA;VENTOLIN HFA) 108 (90 Base) MCG/ACT inhaler Inhale 1-2 puffs into the lungs every 6 (six) hours as needed for wheezing or shortness of breath.     [provider]  ALPRAZolam Duanne Moron) 0.5 MG tablet Take 0.5 mg by mouth 2 (two) times daily as needed for anxiety or sleep.     [provider]  aspirin EC 81 MG tablet Take 1 tablet (81 mg total) by mouth daily. 04/28/17 04/28/18  Debbe Odea, MD  atorvastatin (LIPITOR) 20 MG tablet Take 1 tablet (20 mg total) by mouth daily at 6 PM. Patient not taking: Reported on 10/27/2017 04/28/17   Debbe Odea, MD  bimatoprost (LUMIGAN) 0.03 % ophthalmic solution Place 1 drop into both eyes at bedtime.     [provider]  dorzolamide-timolol (COSOPT) 22.3-6.8 MG/ML ophthalmic solution Place 1 drop into both eyes 2 (two) times daily.     [provider]  gabapentin (NEURONTIN) 100 MG capsule Take 100 mg by mouth every 6 (six) hours.     [provider]  Multiple Vitamin (MULTIVITAMIN WITH MINERALS) TABS tablet Take 1 tablet by mouth daily.    [provider]  nystatin (NYSTATIN) powder Apply 2 g topically 2 (two) times daily as needed (for irritation).    [provider]  pantoprazole (PROTONIX) 40 MG tablet Take 1 tablet (40 mg total) by mouth 2 (two) times daily. 04/17/17   Dessa Phi, DO  polyethylene glycol (MIRALAX / GLYCOLAX) packet Take 17 g by mouth daily.     [provider]  potassium chloride SA (K-DUR,KLOR-CON) 20 MEQ tablet Take 1 tablet (20 mEq total) by mouth 2 (two)  times daily for 2 days. 10/27/17 10/29/17  Duffy Bruce, MD  traMADol (ULTRAM) 50 MG tablet Take 50 mg by mouth every 6 (six) hours as needed for moderate pain.     [provider]  Vitamin D, Ergocalciferol, (DRISDOL) 50000 units CAPS capsule Take 50,000 Units by mouth every 14 (fourteen) days.    [provider]    Family History Family History  Problem Relation Age of Onset  . Colon cancer Sister   . Heart attack Sister     Social History Social History   Tobacco Use  . Smoking status: Never Smoker  . Smokeless tobacco: Never Used  Substance Use Topics  . Alcohol use: No  . Drug use: No     Allergies   Ampicillin; Codeine; and Penicillins   Review of Systems Review of Systems  Constitutional: Positive for fatigue. Negative for chills, diaphoresis and fever.  HENT: Positive for congestion. Negative for rhinorrhea and sneezing.   Eyes: Negative.   Respiratory: Positive for cough and shortness of breath. Negative for chest tightness.   Cardiovascular: Positive for chest pain. Negative for leg swelling.  Gastrointestinal: Negative for abdominal pain, blood in stool, diarrhea, nausea and vomiting.  Genitourinary: Negative for difficulty urinating, flank pain, frequency and hematuria.  Musculoskeletal: Negative for arthralgias and back pain.  Skin: Negative for rash.  Neurological: Negative for dizziness, speech difficulty, weakness, numbness and headaches.     Physical Exam Updated Vital Signs BP 137/76   Pulse 79   Temp 98.4 F (36.9 C) (Oral)   Resp (!) 22   Ht 4\' 11"  (1.499 m)   Wt 38.6 kg (85 lb)   SpO2 100%   BMI 17.17 kg/m   Physical Exam  Constitutional: She is oriented to person, place, and time. She appears well-developed and well-nourished.  HENT:  Head: Normocephalic and atraumatic.  Eyes: Pupils are equal, round, and reactive to light.  Neck: Normal range of motion. Neck supple.  Cardiovascular: Normal rate, regular rhythm and  normal heart sounds.  Pulmonary/Chest: Effort normal. No respiratory distress. She has no wheezes. She has rales. She exhibits tenderness.  Rhonchi bilaterally, mild tachypnea with mild increased work of breathing  Abdominal: Soft. Bowel sounds are normal. There is no tenderness. There is no rebound and no guarding.  Musculoskeletal: Normal range of motion. She exhibits no edema.  No edema or calf tenderness  Lymphadenopathy:    She has no cervical adenopathy.  Neurological: She is alert and oriented to person, place, and time.  Skin: Skin is warm and dry. No rash noted.  Psychiatric: She has a normal mood and affect.     ED Treatments / Results  Labs (all labs ordered are listed, but only abnormal results are displayed)  Labs Reviewed  BASIC METABOLIC PANEL - Abnormal; Notable for the following components:      Result Value   Sodium 128 (*)    Chloride 88 (*)    Creatinine, Ser 0.36 (*)    All other components within normal limits  CBC WITH DIFFERENTIAL/PLATELET - Abnormal; Notable for the following components:   Lymphs Abs 0.5 (*)    All other components within normal limits  TROPONIN I - Abnormal; Notable for the following components:   Troponin I 0.13 (*)    All other components within normal limits  D-DIMER, QUANTITATIVE (NOT AT [facility]) - Abnormal; Notable for the following components:   D-Dimer, Quant 1.03 (*)    All other components within normal limits  BRAIN NATRIURETIC PEPTIDE    EKG EKG Interpretation  Date/Time:  Sunday March 14 2018 18:09:53 EDT Ventricular Rate:  81 PR Interval:    QRS Duration: 85 QT Interval:  363 QTC Calculation: 422 R Axis:   -14 Text Interpretation:  Sinus rhythm Prolonged PR interval Probable left atrial enlargement Low voltage, precordial leads since last tracing no significant change Confirmed by Malvin Johns [id number]) on 03/14/2018 6:21:48 PM   Radiology Dg Chest 2 View  Result Date: 03/14/2018 CLINICAL DATA:  Productive cough.  EXAM: CHEST - 2 VIEW COMPARISON:  October 27, 2017 FINDINGS: There is a large hiatal hernia. Opacity adjacent to the hiatal hernia may be atelectasis and is unchanged or not significantly changed since February 2019. No pneumothorax. No change in the cardiomediastinal silhouette. Continued scoliotic curvature of the spine. IMPRESSION: No acute interval change. Electronically Signed   By: Dorise Bullion III M.D   On: 03/14/2018 17:53   Ct Angio Chest Pe W/cm &/or Wo Cm  Result Date: 03/14/2018 CLINICAL DATA:  84 year old female with positive D-dimer. EXAM: CT ANGIOGRAPHY CHEST WITH CONTRAST TECHNIQUE: Multidetector CT imaging of the chest was performed using the standard protocol during bolus administration of intravenous contrast. Multiplanar CT image reconstructions and MIPs were obtained to evaluate the vascular anatomy. CONTRAST:  41mL ISOVUE-370 IOPAMIDOL (ISOVUE-370) INJECTION 76% COMPARISON:  Chest radiograph dated 03/14/2018 FINDINGS: Cardiovascular: Mild cardiomegaly. No pericardial effusion. There is mild atherosclerotic calcification of the thoracic aorta. No aneurysmal dilatation or evidence of dissection. There is no CT evidence of pulmonary embolism. Mediastinum/Nodes: Top-normal right hilar lymph node measures 8 mm. No hilar or mediastinal adenopathy. There is a large hiatal hernia containing the majority of the stomach. There is associated mass effect and anterior displacement of the heart by the hiatal hernia. Esophagus is grossly unremarkable. No mediastinal fluid collection. Lungs/Pleura: There are multiple scattered clusters of nodularity with tree-in-bud appearance bilaterally most consistent with an infectious process. Clinical correlation is recommended. A patchy area of nodular density is noted in the right middle lobe anteriorly. Small bilateral pleural effusions. There is partial compressive atelectasis of the left lung base. There is no pneumothorax. The central airways are patent.  Upper Abdomen: There is a 15 mm hypodense lesion in the left lobe of the liver as well as a right renal upper pole hypodense lesion which are incompletely characterized but most likely represent cysts. There is moderate amount of stool in the visualized colon. Musculoskeletal: There is osteopenia with severe scoliosis and degenerative changes of the spine. No acute osseous pathology. Review of the MIP images confirms the above findings. IMPRESSION: 1. No CT evidence of pulmonary embolism. 2. Bilateral clusters of nodularity most consistent with pneumonia. Clinical correlation is recommended. Small bilateral pleural effusions. 3. Large hiatal  hernia containing the majority of the stomach. Electronically Signed   By: Anner Crete M.D.   On: 03/14/2018 21:29    Procedures Procedures (including critical care time)  Medications Ordered in ED Medications  cefTRIAXone (ROCEPHIN) 1 g in sodium chloride 0.9 % 100 mL IVPB (1 g Intravenous New Bag/Given 03/14/18 2156)  azithromycin (ZITHROMAX) 500 mg in sodium chloride 0.9 % 250 mL IVPB (has no administration in time range)  albuterol (PROVENTIL) (2.5 MG/3ML) 0.083% nebulizer solution 5 mg (5 mg Nebulization Given 03/14/18 1934)  iopamidol (ISOVUE-370) 76 % injection 100 mL (80 mLs Intravenous Contrast Given 03/14/18 2047)     Initial Impression / Assessment and Plan / ED Course  I have reviewed the triage vital signs and the nursing notes.  Pertinent labs & imaging results that were available during my care of the patient were reviewed by me and considered in my medical decision making (see chart for details).     Patient is a 84 year old female who presents with a one-week history of worsening cough.  She has some mild hypoxia and is placed on nasal cannula.  She has some mild increased work of breathing but is in no distress.  She had a chest x-ray which was negative for acute abnormality.  There is no suggestions of fluid overload.  She did have a  mildly elevated troponin but no ischemic changes on EKG.  She has some chest pain when she coughs but no other chest pain.  She also had an elevated d-dimer and because of this, CT scan was performed of the chest.  This shows no evidence of pulmonary emboli however there is evidence of pneumonia on the CT scan.  She was started on Rocephin and Zithromax for community-acquired pneumonia.  She was also given a nebulizer treatment for some mild wheezing.  I spoke with Dr. Alcario Drought with the hospitalist service who will admit the patient to [facility].  Final Clinical Impressions(s) / ED Diagnoses   Final diagnoses:  Community acquired pneumonia, unspecified laterality  Hypoxia  Hyponatremia  Elevated troponin    ED Discharge Orders    None       Malvin Johns, MD 03/14/18 2227

## 2018-03-14 NOTE — ED Notes (Signed)
Patient transported to CT 

## 2018-03-14 NOTE — ED Triage Notes (Signed)
Patient has had cough since Monday - reports that she took some old antibiotics but they are running out now and she is not any better. Patient reports some weakness

## 2018-03-14 NOTE — ED Notes (Signed)
Patient transported to X-ray 

## 2018-03-14 NOTE — ED Notes (Signed)
Call Emelda Fear for any updates [phone]

## 2018-03-14 NOTE — ED Notes (Signed)
Dr. Tamera Punt aware that troponin is 0.13.

## 2018-03-15 ENCOUNTER — Inpatient Hospital Stay: Payer: Medicare Other | Admitting: Internal Medicine

## 2018-03-15 ENCOUNTER — Inpatient Hospital Stay: Payer: Medicare Other

## 2018-03-15 ENCOUNTER — Inpatient Hospital Stay (HOSPITAL_COMMUNITY): Payer: Medicare Other

## 2018-03-15 DIAGNOSIS — J189 Pneumonia, unspecified organism: Secondary | ICD-10-CM | POA: Diagnosis present

## 2018-03-15 DIAGNOSIS — R630 Anorexia: Secondary | ICD-10-CM

## 2018-03-15 DIAGNOSIS — R778 Other specified abnormalities of plasma proteins: Secondary | ICD-10-CM | POA: Diagnosis present

## 2018-03-15 DIAGNOSIS — R531 Weakness: Secondary | ICD-10-CM

## 2018-03-15 DIAGNOSIS — R059 Cough, unspecified: Secondary | ICD-10-CM | POA: Diagnosis present

## 2018-03-15 DIAGNOSIS — I340 Nonrheumatic mitral (valve) insufficiency: Secondary | ICD-10-CM

## 2018-03-15 DIAGNOSIS — R7989 Other specified abnormal findings of blood chemistry: Secondary | ICD-10-CM | POA: Diagnosis present

## 2018-03-15 DIAGNOSIS — R05 Cough: Secondary | ICD-10-CM | POA: Diagnosis present

## 2018-03-15 LAB — BASIC METABOLIC PANEL
ANION GAP: 8 (ref 5–15)
BUN: 11 mg/dL (ref 8–23)
CALCIUM: 9 mg/dL (ref 8.9–10.3)
CO2: 31 mmol/L (ref 22–32)
CREATININE: 0.35 mg/dL — AB (ref 0.44–1.00)
Chloride: 90 mmol/L — ABNORMAL LOW (ref 98–111)
GFR calc Af Amer: >60 mL/min (ref >60)
GLUCOSE: 91 mg/dL (ref 70–99)
Potassium: 3.8 mmol/L (ref 3.5–5.1)
Sodium: 129 mmol/L — ABNORMAL LOW (ref 135–145)

## 2018-03-15 LAB — EXPECTORATED SPUTUM ASSESSMENT W REFEX TO RESP CULTURE

## 2018-03-15 LAB — RESPIRATORY PANEL BY PCR
Adenovirus: NOT DETECTED
BORDETELLA PERTUSSIS-RVPCR: NOT DETECTED
CORONAVIRUS 229E-RVPPCR: NOT DETECTED
CORONAVIRUS HKU1-RVPPCR: NOT DETECTED
Chlamydophila pneumoniae: NOT DETECTED
Coronavirus NL63: NOT DETECTED
Coronavirus OC43: NOT DETECTED
Influenza A: NOT DETECTED
Influenza B: NOT DETECTED
MYCOPLASMA PNEUMONIAE-RVPPCR: NOT DETECTED
Metapneumovirus: NOT DETECTED
PARAINFLUENZA VIRUS 1-RVPPCR: NOT DETECTED
PARAINFLUENZA VIRUS 2-RVPPCR: NOT DETECTED
Parainfluenza Virus 3: NOT DETECTED
Parainfluenza Virus 4: NOT DETECTED
Respiratory Syncytial Virus: NOT DETECTED
Rhinovirus / Enterovirus: NOT DETECTED

## 2018-03-15 LAB — STREP PNEUMONIAE URINARY ANTIGEN: STREP PNEUMO URINARY ANTIGEN: NEGATIVE

## 2018-03-15 LAB — ECHOCARDIOGRAM COMPLETE
HEIGHTINCHES: 59 in
WEIGHTICAEL: 1360 [oz_av]

## 2018-03-15 LAB — OSMOLALITY, URINE: Osmolality, Ur: 365 mOsm/kg (ref 300–900)

## 2018-03-15 LAB — SODIUM, URINE, RANDOM: Sodium, Ur: 49 mmol/L

## 2018-03-15 MED ORDER — ALPRAZOLAM 0.5 MG PO TABS
0.5000 mg | ORAL_TABLET | Freq: Two times a day (BID) | ORAL | Status: DC | PRN
  Administered 2018-03-15 – 2018-03-17 (×4): 0.5 mg via ORAL
  Filled 2018-03-15 (×4): qty 1

## 2018-03-15 MED ORDER — AZITHROMYCIN 250 MG PO TABS: 250.0000 mg | ORAL_TABLET | Freq: Every day | ORAL | Status: DC

## 2018-03-15 MED ORDER — LATANOPROST 0.005 % OP SOLN
1.0000 [drp] | Freq: Every day | OPHTHALMIC | Status: DC
  Administered 2018-03-15 – 2018-03-17 (×3): 1 [drp] via OPHTHALMIC
  Filled 2018-03-15: qty 2.5

## 2018-03-15 MED ORDER — DORZOLAMIDE HCL-TIMOLOL MAL 2-0.5 % OP SOLN
1.0000 [drp] | Freq: Two times a day (BID) | OPHTHALMIC | Status: DC
  Filled 2018-03-15: qty 10

## 2018-03-15 MED ORDER — ALBUTEROL SULFATE HFA 108 (90 BASE) MCG/ACT IN AERS
1.0000 | INHALATION_SPRAY | Freq: Four times a day (QID) | RESPIRATORY_TRACT | Status: DC | PRN
  Filled 2018-03-15: qty 6.7

## 2018-03-15 MED ORDER — GUAIFENESIN 100 MG/5ML PO SOLN: 5.0000 mL | ORAL | Status: DC | PRN

## 2018-03-15 MED ORDER — AZITHROMYCIN 250 MG PO TABS
250.0000 mg | ORAL_TABLET | Freq: Every day | ORAL | Status: DC
  Administered 2018-03-15 – 2018-03-17 (×3): 250 mg via ORAL
  Filled 2018-03-15 (×3): qty 1

## 2018-03-15 MED ORDER — PANTOPRAZOLE SODIUM 40 MG PO TBEC: 40.0000 mg | DELAYED_RELEASE_TABLET | Freq: Two times a day (BID) | ORAL | Status: DC

## 2018-03-15 MED ORDER — SODIUM CHLORIDE 0.9 % IV SOLN
1.0000 g | INTRAVENOUS | Status: DC
  Administered 2018-03-15 – 2018-03-16 (×2): 1 g via INTRAVENOUS
  Filled 2018-03-15 (×2): qty 1

## 2018-03-15 MED ORDER — BENZONATATE 100 MG PO CAPS
100.0000 mg | ORAL_CAPSULE | Freq: Three times a day (TID) | ORAL | Status: DC
  Administered 2018-03-15 – 2018-03-18 (×10): 100 mg via ORAL
  Filled 2018-03-15 (×10): qty 1

## 2018-03-15 MED ORDER — ENOXAPARIN SODIUM 40 MG/0.4ML ~~LOC~~ SOLN
30.0000 mg | SUBCUTANEOUS | Status: DC
  Administered 2018-03-15: 30 mg via SUBCUTANEOUS
  Filled 2018-03-15 (×2): qty 0.4

## 2018-03-15 MED ORDER — GABAPENTIN 100 MG PO CAPS
100.0000 mg | ORAL_CAPSULE | Freq: Three times a day (TID) | ORAL | Status: DC
  Administered 2018-03-15 – 2018-03-18 (×10): 100 mg via ORAL
  Filled 2018-03-15 (×10): qty 1

## 2018-03-15 MED ORDER — ENSURE ENLIVE PO LIQD
237.0000 mL | Freq: Two times a day (BID) | ORAL | Status: DC
  Administered 2018-03-15 – 2018-03-18 (×5): 237 mL via ORAL

## 2018-03-15 MED ORDER — SODIUM CHLORIDE 0.9 % IV SOLN: 1.0000 g | INTRAVENOUS | Status: DC

## 2018-03-15 MED ORDER — PANTOPRAZOLE SODIUM 40 MG PO TBEC
40.0000 mg | DELAYED_RELEASE_TABLET | Freq: Two times a day (BID) | ORAL | Status: DC
  Administered 2018-03-15 – 2018-03-18 (×6): 40 mg via ORAL
  Filled 2018-03-15 (×8): qty 1

## 2018-03-15 MED ORDER — DORZOLAMIDE HCL-TIMOLOL MAL 2-0.5 % OP SOLN
1.0000 [drp] | Freq: Two times a day (BID) | OPHTHALMIC | Status: DC
  Administered 2018-03-15 – 2018-03-18 (×7): 1 [drp] via OPHTHALMIC
  Filled 2018-03-15: qty 10

## 2018-03-15 MED ORDER — MENTHOL 3 MG MT LOZG
1.0000 | LOZENGE | OROMUCOSAL | Status: DC | PRN
  Filled 2018-03-15: qty 9

## 2018-03-15 MED ORDER — POLYETHYLENE GLYCOL 3350 17 G PO PACK
17.0000 g | PACK | Freq: Every day | ORAL | Status: DC
  Administered 2018-03-15 – 2018-03-17 (×3): 17 g via ORAL
  Filled 2018-03-15 (×4): qty 1

## 2018-03-15 MED ORDER — DM-GUAIFENESIN ER 30-600 MG PO TB12
1.0000 | ORAL_TABLET | Freq: Two times a day (BID) | ORAL | Status: DC
  Administered 2018-03-15 – 2018-03-18 (×7): 1 via ORAL
  Filled 2018-03-15 (×7): qty 1

## 2018-03-15 MED ORDER — SODIUM CHLORIDE 0.9 % IV BOLUS
500.0000 mL | Freq: Once | INTRAVENOUS | Status: AC
  Administered 2018-03-15: 500 mL via INTRAVENOUS

## 2018-03-15 MED ORDER — ACETAMINOPHEN 325 MG PO TABS: 650.0000 mg | ORAL_TABLET | Freq: Four times a day (QID) | ORAL | Status: DC | PRN

## 2018-03-15 MED ORDER — ORAL CARE MOUTH RINSE
15.0000 mL | Freq: Two times a day (BID) | OROMUCOSAL | Status: DC
  Administered 2018-03-16 – 2018-03-17 (×2): 15 mL via OROMUCOSAL

## 2018-03-15 MED ORDER — SODIUM CHLORIDE 0.9 % IV SOLN
INTRAVENOUS | Status: DC
  Administered 2018-03-16: 02:00:00 via INTRAVENOUS

## 2018-03-15 MED ORDER — MEGESTROL ACETATE 400 MG/10ML PO SUSP
400.0000 mg | Freq: Two times a day (BID) | ORAL | Status: DC
  Administered 2018-03-15 – 2018-03-18 (×8): 400 mg via ORAL
  Filled 2018-03-15 (×8): qty 10

## 2018-03-15 MED ORDER — GABAPENTIN 100 MG PO CAPS: 100.0000 mg | ORAL_CAPSULE | Freq: Four times a day (QID) | ORAL | Status: DC

## 2018-03-15 MED ORDER — LATANOPROST 0.005 % OP SOLN
1.0000 [drp] | Freq: Every day | OPHTHALMIC | Status: DC
  Filled 2018-03-15: qty 2.5

## 2018-03-15 MED ORDER — HYDROCODONE-ACETAMINOPHEN 5-325 MG PO TABS
1.0000 | ORAL_TABLET | ORAL | Status: DC | PRN
  Administered 2018-03-16 – 2018-03-17 (×2): 1 via ORAL
  Filled 2018-03-15 (×2): qty 1

## 2018-03-15 MED ORDER — ALBUTEROL SULFATE (2.5 MG/3ML) 0.083% IN NEBU: 2.5000 mg | INHALATION_SOLUTION | Freq: Four times a day (QID) | RESPIRATORY_TRACT | Status: DC | PRN

## 2018-03-15 MED ORDER — ASPIRIN EC 81 MG PO TBEC
81.0000 mg | DELAYED_RELEASE_TABLET | Freq: Every day | ORAL | Status: DC
  Administered 2018-03-15 – 2018-03-18 (×4): 81 mg via ORAL
  Filled 2018-03-15 (×4): qty 1

## 2018-03-15 NOTE — Evaluation (Signed)
Physical Therapy Evaluation Patient Details Name: Jeanne Cruz MRN: [id number] DOB: [birth year] Today's Date: 03/15/2018   History of Present Illness  Jeanne Cruz is a 84 y.o.femalewith medical history significant ofwheezing on PRN albuterol but no diagnosis of COPD or asthma, hypertension.  Admitted with hypoxia,, RR in 30s and d dimer of 1.03. CT of the chest revealed infiltrates consistent with bilateral pneumonia.  Clinical Impression  Patient presents with decreased independence due to weakness and some SOB.  Currently minguard for sit to stand and ambulated 180' with rollator on O2 due to difficulty getting O2 sat reading and mild SOB noted at rest.  Reports her hernia is main reason for SOB.  Feel she will be able to return to ALF and resume HHPT at d/c.  PT to follow acutely.     Follow Up Recommendations Home health PT;Supervision for mobility/OOB    Equipment Recommendations  None recommended by PT    Recommendations for Other Services       Precautions / Restrictions Precautions Precautions: Fall Precaution Comments: on O2, not able to read sats on finger monitor      Mobility  Bed Mobility Overal bed mobility: Needs Assistance Bed Mobility: Sit to Supine       Sit to supine: Supervision;HOB elevated   General bed mobility comments: positioning with pillows per pt directions  Transfers Overall transfer level: Needs assistance Equipment used: 4-wheeled walker Transfers: Sit to/from Stand Sit to Stand: Min guard         General transfer comment: for safety  Ambulation/Gait Ambulation/Gait assistance: Min Gaffer (Feet): 180 Feet Assistive device: 4-wheeled walker Gait Pattern/deviations: Step-through pattern;Step-to pattern;Decreased stride length;Trunk flexed     General Gait Details: maintained on O2 due to pt reports wheezing and some SOB noted at rest, but no increased SOB (unable to get O2 sat on finger monitor, RN  aware)  Stairs            Wheelchair Mobility    Modified Rankin (Stroke Patients Only)       Balance Overall balance assessment: Needs assistance   Sitting balance-Leahy Scale: Fair     Standing balance support: Single extremity supported;During functional activity Standing balance-Leahy Scale: Poor Standing balance comment: using one hand support on rollator while wiping after toileting but able to get underwear up with minguard for safety no UE support                             Pertinent Vitals/Pain Pain Assessment: No/denies pain    Home Living Family/patient expects to be discharged to:: Assisted living Living Arrangements: Alone             Home Equipment: Walker - 4 wheels;Cane - single point Additional Comments: Actuary    Prior Function Level of Independence: Needs assistance   Gait / Transfers Assistance Needed: independent with rollator  ADL's / Homemaking Assistance Needed: assist for meals, shower  Comments: uses 4 wheeled walker at home     Hand Dominance   Dominant Hand: Right    Extremity/Trunk Assessment   Upper Extremity Assessment Upper Extremity Assessment: Generalized weakness(arthritic changes in hands and shoulders)    Lower Extremity Assessment Lower Extremity Assessment: Generalized weakness(R foot with severe hallux valgus and bunion for which she relieves her shoe)    Cervical / Trunk Assessment Cervical / Trunk Assessment: Kyphotic;Other exceptions Cervical / Trunk Exceptions: large hiatal hernia  Communication  Communication: No difficulties  Cognition Arousal/Alertness: Awake/alert Behavior During Therapy: WFL for tasks assessed/performed Overall Cognitive Status: Within Functional Limits for tasks assessed                                        General Comments General comments (skin integrity, edema, etc.): family in room and assisting with care upon my entry; states she  has a caregiver as well who takes her to MD appointments, etc    Exercises     Assessment/Plan    PT Assessment Patient needs continued PT services  PT Problem List Decreased mobility;Decreased strength;Decreased activity tolerance;Decreased balance;Cardiopulmonary status limiting activity       PT Treatment Interventions DME instruction;Functional mobility training;Balance training;Patient/family education;Gait training;Therapeutic exercise;Therapeutic activities    PT Goals (Current goals can be found in the Care Plan section)  Acute Rehab PT Goals Patient Stated Goal: To return to ALF PT Goal Formulation: With patient Time For Goal Achievement: 03/22/18 Potential to Achieve Goals: Good    Frequency Min 3X/week   Barriers to discharge        Co-evaluation               AM-PAC PT "6 Clicks" Daily Activity  Outcome Measure Difficulty turning over in bed (including adjusting bedclothes, sheets and blankets)?: A Little Difficulty moving from lying on back to sitting on the side of the bed? : A Little Difficulty sitting down on and standing up from a chair with arms (e.g., wheelchair, bedside commode, etc,.)?: Unable Help needed moving to and from a bed to chair (including a wheelchair)?: A Little Help needed walking in hospital room?: A Little Help needed climbing 3-5 steps with a railing? : A Lot 6 Click Score: 15    End of Session Equipment Utilized During Treatment: Oxygen Activity Tolerance: Patient tolerated treatment well Patient left: in bed;with call bell/phone within reach   PT Visit Diagnosis: Other abnormalities of gait and mobility (R26.89);Muscle weakness (generalized) (M62.81)    Time: 0786-7544 PT Time Calculation (min) (ACUTE ONLY): 40 min   Charges:   PT Evaluation $PT Eval Moderate Complexity: 1 Mod PT Treatments $Gait Training: 8-22 mins $Therapeutic Activity: 8-22 mins   PT G CodesMagda Kiel,  [place] [phone] 03/15/2018   Reginia Naas 03/15/2018, 12:33 PM

## 2018-03-15 NOTE — Progress Notes (Signed)
  Echocardiogram 2D Echocardiogram has been performed.  Jeanne Cruz 03/15/2018, 3:18 PM

## 2018-03-15 NOTE — Evaluation (Signed)
Occupational Therapy Evaluation Patient Details Name: Jeanne Cruz MRN: [id number] DOB: Apr 08, 1928 Today's Date: 03/15/2018    History of Present Illness Jeanne Cruz is a 84 y.o.femalewith medical history significant ofwheezing on PRN albuterol but no diagnosis of COPD or asthma, hypertension.  Admitted with hypoxia,, RR in 30s and d dimer of 1.03. CT of the chest revealed infiltrates consistent with bilateral pneumonia.   Clinical Impression   Pt with decline in function and safety with ADLs and ADL mobility with decreased strength, balance and endurance. PTA pt lived at an ALF and was able to ambulate with RW to dining room for lunch/dinner but was making light breakfast in her apartment per pt. Pt would benefit from acute OT services to address impairments to maximize level of function and safety    Follow Up Recommendations  Home health OT;Supervision/Assistance - 24 hour    Equipment Recommendations  None recommended by OT    Recommendations for Other Services       Precautions / Restrictions Precautions Precautions: Fall Precaution Comments: on O2, not able to read sats on finger monitor Restrictions Weight Bearing Restrictions: No      Mobility Bed Mobility Overal bed mobility: Needs Assistance Bed Mobility: Sit to Supine;Supine to Sit     Supine to sit: Supervision Sit to supine: Supervision;HOB elevated   General bed mobility comments: positioning with pillows per pt directions  Transfers Overall transfer level: Needs assistance Equipment used: 4-wheeled walker Transfers: Sit to/from Stand Sit to Stand: Min guard         General transfer comment: for safety    Balance Overall balance assessment: Needs assistance Sitting-balance support: No upper extremity supported;Feet supported Sitting balance-Leahy Scale: Fair     Standing balance support: Single extremity supported;During functional activity Standing balance-Leahy Scale: Poor Standing  balance comment: using one hand support on rollator while wiping after toileting but able to get underwear up with minguard for safety no UE support                           ADL either performed or assessed with clinical judgement   ADL Overall ADL's : Needs assistance/impaired Eating/Feeding: Set up;Sitting   Grooming: Wash/dry hands;Wash/dry face;Min guard;Standing   Upper Body Bathing: Min guard;Sitting   Lower Body Bathing: Minimal assistance   Upper Body Dressing : Min guard   Lower Body Dressing: Minimal assistance   Toilet Transfer: Min guard;BSC   Toileting- Clothing Manipulation and Hygiene: Minimal assistance;Sit to/from stand       Functional mobility during ADLs: Min guard;Rolling walker       Vision Baseline Vision/History: Wears glasses Wears Glasses: At all times Patient Visual Report: No change from baseline       Perception     Praxis      Pertinent Vitals/Pain Pain Assessment: No/denies pain     Hand Dominance Right   Extremity/Trunk Assessment Upper Extremity Assessment Upper Extremity Assessment: Generalized weakness   Lower Extremity Assessment Lower Extremity Assessment: Defer to PT evaluation   Cervical / Trunk Assessment Cervical / Trunk Assessment: Kyphotic Cervical / Trunk Exceptions: large hiatal hernia   Communication Communication Communication: No difficulties   Cognition Arousal/Alertness: Awake/alert Behavior During Therapy: WFL for tasks assessed/performed Overall Cognitive Status: Within Functional Limits for tasks assessed  General Comments  family in room and assisting with care upon my entry; states she has a caregiver as well who takes her to MD appointments, etc    Exercises     Shoulder Instructions      Home Living Family/patient expects to be discharged to:: Assisted living Living Arrangements: Alone;Other (Comment)(ALF apartment)                            Home Equipment: Walker - 4 wheels;Cane - single point   Additional Comments: Actuary      Prior Functioning/Environment Level of Independence: Needs assistance  Gait / Transfers Assistance Needed: independent with rollator ADL's / Homemaking Assistance Needed: assist for meals, shower   Comments: uses 4 wheeled walker at home        OT Problem List: Decreased strength;Decreased activity tolerance;Impaired balance (sitting and/or standing);Decreased safety awareness;Decreased knowledge of use of DME or AE      OT Treatment/Interventions: Self-care/ADL training;Therapeutic exercise;Balance training;DME and/or AE instruction;Neuromuscular education;Therapeutic activities;Patient/family education    OT Goals(Current goals can be found in the care plan section) Acute Rehab OT Goals Patient Stated Goal: To return to ALF OT Goal Formulation: With patient Time For Goal Achievement: 03/29/18 Potential to Achieve Goals: Good ADL Goals Pt Will Perform Grooming: with supervision;with set-up;standing Pt Will Perform Upper Body Bathing: with set-up;with supervision;sitting Pt Will Perform Lower Body Bathing: with min guard assist;sit to/from stand Pt Will Perform Upper Body Dressing: with supervision;with set-up;sitting Pt Will Perform Lower Body Dressing: with min guard assist;sit to/from stand Pt Will Transfer to Toilet: with supervision;ambulating;regular height toilet;bedside commode;grab bars Pt Will Perform Toileting - Clothing Manipulation and hygiene: with min guard assist;sit to/from stand  OT Frequency: Min 2X/week   Barriers to D/C:    no barriers       Co-evaluation              AM-PAC PT "6 Clicks" Daily Activity     Outcome Measure   Help from another person taking care of personal grooming?: A Little Help from another person toileting, which includes using toliet, bedpan, or urinal?: A Little Help from another person bathing  (including washing, rinsing, drying)?: A Little Help from another person to put on and taking off regular upper body clothing?: A Little Help from another person to put on and taking off regular lower body clothing?: A Little 6 Click Score: 15   End of Session Equipment Utilized During Treatment: Gait belt;Other (comment)(BSC)  Activity Tolerance: Patient tolerated treatment well Patient left: in bed;with call bell/phone within reach;Other (comment)(with SLP)  OT Visit Diagnosis: Unsteadiness on feet (R26.81);Other abnormalities of gait and mobility (R26.89);Muscle weakness (generalized) (M62.81);Adult, failure to thrive (R62.7)                Time: 9983-3825 OT Time Calculation (min): 18 min Charges:  OT General Charges $OT Visit: 1 Visit OT Evaluation $OT Eval Moderate Complexity: 1 Mod G-Codes: OT G-codes **NOT FOR INPATIENT CLASS** Functional Assessment Tool Used: AM-PAC 6 Clicks Daily Activity     Britt Bottom 03/15/2018, 2:40 PM

## 2018-03-15 NOTE — Progress Notes (Signed)
MD paged. Pt received IV abx prior to transfer from [facility].

## 2018-03-15 NOTE — Evaluation (Signed)
Clinical/Bedside Swallow Evaluation Patient Details  Name: Jeanne Cruz MRN: [id number] Date of Birth: July 16, 1928  Today's Date: 03/15/2018 Time: SLP Start Time (ACUTE ONLY): 1410 SLP Stop Time (ACUTE ONLY): 1438 SLP Time Calculation (min) (ACUTE ONLY): 28 min  Past Medical History:  Past Medical History:  Diagnosis Date  . Anemia   . Anxiety   . Arthritis   . Blood transfusion without reported diagnosis   . Degenerative disc disease, cervical   . GI bleeding   . Iron deficiency anemia   . Neuropathy   . Postherpetic neuralgia   . Shingles   . Stroke [facility])    Past Surgical History:  Past Surgical History:  Procedure Laterality Date  . ABDOMINAL HYSTERECTOMY    . APPENDECTOMY    . BACK SURGERY    . ESOPHAGOGASTRODUODENOSCOPY N/A 04/17/2017   Procedure: ESOPHAGOGASTRODUODENOSCOPY (EGD);  Surgeon: Carol Ada, MD;  Location: [facility];  Service: Endoscopy;  Laterality: N/A;  . TONSILLECTOMY     HPI:  84 yo female adm to [facility] with respiratory deficits- weakness and cough x1 week - found to have pna.  PMH + for COPD, CVA, shingles, malnutrition.  Pt was hypoxic with RR 30 and has a large hiatal hernia.  Pt admits to h/o dysphagia - at least back to 2005 - right cerebellum - subacute ischemia 04/2017.   Pt admits to decreased appetite and worsening dysphagia.     Assessment / Plan / Recommendation Clinical Impression  Pt with known h/o esophageal dysphagia including dysmotility, paraesophageal hernia, reflux and mild hypertrophic cricopharyngeus.  Today with this SLP she does not demonstrate s/s of aspiration.  She does admit to worsening dysphagia since 2005 evaluation.  Observed pt consuming beef, bread, water and tea.  Pt isolates most symptoms to her pharynx, however SlP suspects referrant from distal esophagus.  Recommend dys3/thin diet and consider repeat esophagram given pt report of progressive dysphagia. Educated pt to findings/recommendations using teach back.  SLP Visit  Diagnosis: Dysphagia, unspecified (R13.10);Dysphagia, pharyngoesophageal phase (R13.14)    Aspiration Risk  Mild aspiration risk    Diet Recommendation Dysphagia 3 (Mech soft);Thin liquid   Medication Administration: Whole meds with puree Supervision: Patient able to self feed Compensations: Slow rate;Small sips/bites;Other (Comment)(drink liquids t/o meal) Postural Changes: Seated upright at 90 degrees;Remain upright for at least 30 minutes after po intake    Other  Recommendations Recommended Consults: Consider esophageal assessment Oral Care Recommendations: Oral care BID   Follow up Recommendations        Frequency and Duration min 2x/week  1 week       Prognosis Prognosis for Safe Diet Advancement: Fair Barriers to Reach Goals: Time post onset      Swallow Study   General Date of Onset: 03/15/18 HPI: 84 yo female adm to [facility] with respiratory deficits- weakness and cough x1 week - found to have pna.  PMH + for COPD, CVA, shingles, malnutrition.  Pt was hypoxic with RR 30 and has a large hiatal hernia.  Pt admits to h/o dysphagia - at least back to 2005 - right cerebellum - subacute ischemia 04/2017.   Pt admits to decreased appetite and worsening dysphagia.   Type of Study: MBS-Modified Barium Swallow Study Diet Prior to this Study: Regular;Thin liquids Temperature Spikes Noted: No Respiratory Status: Nasal cannula History of Recent Intubation: No Behavior/Cognition: Alert;Cooperative;Pleasant mood Oral Cavity Assessment: Within Functional Limits Oral Care Completed by SLP: No Oral Cavity - Dentition: Adequate natural dentition Vision: Functional for self-feeding Self-Feeding  Abilities: Able to feed self Patient Positioning: Upright in bed Baseline Vocal Quality: Normal Volitional Cough: Strong Volitional Swallow: Able to elicit    Oral/Motor/Sensory Function Overall Oral Motor/Sensory Function: Within functional limits   Ice Chips Ice chips: Not tested   Thin  Liquid Thin Liquid: Within functional limits Presentation: Cup;Self Fed;Straw    Nectar Thick Nectar Thick Liquid: Not tested   Honey Thick Honey Thick Liquid: Not tested   Puree Puree: Not tested   Solid   GO   Solid: Impaired Other Comments: slow mastication of viscous beef - pt reported need for liquids to help clear pharynx  -         Macario Golds 03/15/2018,2:49 PM   Luanna Salk, [place] [facility] SLP [phone]

## 2018-03-15 NOTE — Progress Notes (Signed)
PROGRESS NOTE    [name]   HMC:[id number]  DOB: Mar 24, 1928  DOA: 03/14/2018 PCP: [facility]   Brief Narrative:  Jeanne Cruz is a 84 y.o. female with medical history significant of wheezing on PRN albuterol but no diagnosis of COPD or asthma, hypertension, presented to ER with a productive cough for a week and extreme weakness. Noted to by hypoxic in the ED, RR in 30s and d dimer of 1.03. CT of the chest revealed infiltrates consistent with bilateral pneumonia. Caregiver is concerned that she may be aspirating.  She also admits to a steady weight loss due to a loss of appetite for the past 1-2 years.     Subjective: Cough is still about the same. She has no new complaints.. ROS: no complaints of nausea, vomiting, constipation diarrhea, cough, dyspnea or dysuria. No other complaints.   Assessment & Plan:   Principal Problem:   CAP (community acquired pneumonia) - Rocephin and Zithromax - add Guaifenesin DM, Tessalon, lozenges -  resp virus panel negative  Active Problems:   Hyponatremia - sodium 129- bolus 500 cc NS and cont slow IVF- recheck later today     Elevated troponin - possibly demand ischemia    Weakness generalized   Anorexia Severe protein calorie malnutrition - start Megace and Ensure and obtain PT eval   DVT prophylaxis: Lovenox Code Status: Full code Family Communication:  Disposition Plan: follow for improvement in med/surg Consultants:   none Procedures:   none Antimicrobials:  Anti-infectives (From admission, onward)   Start     Dose/Rate Route Frequency Ordered Stop   03/16/18 1000  azithromycin (ZITHROMAX) tablet 250 mg  Status:  Discontinued     250 mg Oral Daily 03/15/18 0009 03/15/18 0129   03/16/18 0900  cefTRIAXone (ROCEPHIN) 1 g in sodium chloride 0.9 % 100 mL IVPB  Status:  Discontinued     1 g 200 mL/hr over 30 Minutes Intravenous Every 24 hours 03/15/18 0009 03/15/18 0125   03/15/18 2200  cefTRIAXone  (ROCEPHIN) 1 g in sodium chloride 0.9 % 100 mL IVPB     1 g 200 mL/hr over 30 Minutes Intravenous Every 24 hours 03/15/18 0126 03/21/18 2159   03/15/18 2200  azithromycin (ZITHROMAX) tablet 250 mg     250 mg Oral Daily at bedtime 03/15/18 0130 03/19/18 2159   03/14/18 2237  azithromycin (ZITHROMAX) 500 MG injection    Note to Pharmacy:  Shellee Milo   : cabinet override      03/14/18 2237 03/15/18 1044   03/14/18 2145  cefTRIAXone (ROCEPHIN) 1 g in sodium chloride 0.9 % 100 mL IVPB     1 g 200 mL/hr over 30 Minutes Intravenous  Once 03/14/18 2143 03/14/18 2235   03/14/18 2145  azithromycin (ZITHROMAX) 500 mg in sodium chloride 0.9 % 250 mL IVPB     500 mg 250 mL/hr over 60 Minutes Intravenous  Once 03/14/18 2143 03/14/18 2351       Objective: Vitals:   03/14/18 2200 03/14/18 2300 03/15/18 0000 03/15/18 0511  BP: 137/76 99/64 102/84 117/66  Pulse: 79 89 88 79  Resp: (!) 22 12 15 12   Temp:   98.2 F (36.8 C) 98.3 F (36.8 C)  TempSrc:   Oral Oral  SpO2: 100% 98% 100% 100%  Weight:      Height:        Intake/Output Summary (Last 24 hours) at 03/15/2018 1030 Last data filed at 03/15/2018 0339 Gross per 24 hour  Intake 0 ml  Output 200 ml  Net -200 ml   Filed Weights   03/14/18 1643  Weight: 38.6 kg (85 lb)    Examination: General exam: Appears comfortable  HEENT: PERRLA, oral mucosa moist, no sclera icterus or thrush Respiratory system: crackles at bases, persistent weak cough Cardiovascular system: S1 & S2 heard, RRR.   Gastrointestinal system: Abdomen soft, non-tender, nondistended. Normal bowel sound. No organomegaly Central nervous system: Alert and oriented. No focal neurological deficits. Extremities: No cyanosis, clubbing or edema Skin: No rashes or ulcers Psychiatry:  Mood & affect appropriate.     Data Reviewed: I have personally reviewed following labs and imaging studies  CBC: Recent Labs  Lab 03/14/18 1830  WBC 7.4  NEUTROABS 6.2  HGB 14.3  HCT  41.7  MCV 91.4  PLT 324   Basic Metabolic Panel: Recent Labs  Lab 03/14/18 1830 03/15/18 0808  NA 128* 129*  K 3.7 3.8  CL 88* 90*  CO2 32 31  GLUCOSE 90 91  BUN 12 11  CREATININE 0.36* 0.35*  CALCIUM 9.2 9.0   GFR: Estimated Creatinine Clearance: 29.6 mL/min (A) (by C-G formula based on SCr of 0.35 mg/dL (L)). Liver Function Tests: No results for input(s): AST, ALT, ALKPHOS, BILITOT, PROT, ALBUMIN in the last 168 hours. No results for input(s): LIPASE, AMYLASE in the last 168 hours. No results for input(s): AMMONIA in the last 168 hours. Coagulation Profile: No results for input(s): INR, PROTIME in the last 168 hours. Cardiac Enzymes: Recent Labs  Lab 03/14/18 1830  TROPONINI 0.13*   BNP (last 3 results) No results for input(s): PROBNP in the last 8760 hours. HbA1C: No results for input(s): HGBA1C in the last 72 hours. CBG: No results for input(s): GLUCAP in the last 168 hours. Lipid Profile: No results for input(s): CHOL, HDL, LDLCALC, TRIG, CHOLHDL, LDLDIRECT in the last 72 hours. Thyroid Function Tests: No results for input(s): TSH, T4TOTAL, FREET4, T3FREE, THYROIDAB in the last 72 hours. Anemia Panel: No results for input(s): VITAMINB12, FOLATE, FERRITIN, TIBC, IRON, RETICCTPCT in the last 72 hours. Urine analysis:    Component Value Date/Time   COLORURINE STRAW (A) 10/27/2017 1625   APPEARANCEUR CLEAR 10/27/2017 1625   LABSPEC 1.004 (L) 10/27/2017 1625   PHURINE 7.0 10/27/2017 1625   GLUCOSEU NEGATIVE 10/27/2017 1625   HGBUR SMALL (A) 10/27/2017 1625   BILIRUBINUR NEGATIVE 10/27/2017 1625   KETONESUR 20 (A) 10/27/2017 1625   PROTEINUR NEGATIVE 10/27/2017 1625   NITRITE NEGATIVE 10/27/2017 1625   LEUKOCYTESUR NEGATIVE 10/27/2017 1625   Sepsis Labs: @LABRCNTIP (procalcitonin:4,lacticidven:4) ) Recent Results (from the past 240 hour(s))  Respiratory Panel by PCR     Status: None   Collection Time: 03/15/18  3:58 AM  Result Value Ref Range Status    Adenovirus NOT DETECTED NOT DETECTED Final   Coronavirus 229E NOT DETECTED NOT DETECTED Final   Coronavirus HKU1 NOT DETECTED NOT DETECTED Final   Coronavirus NL63 NOT DETECTED NOT DETECTED Final   Coronavirus OC43 NOT DETECTED NOT DETECTED Final   Metapneumovirus NOT DETECTED NOT DETECTED Final   Rhinovirus / Enterovirus NOT DETECTED NOT DETECTED Final   Influenza A NOT DETECTED NOT DETECTED Final   Influenza B NOT DETECTED NOT DETECTED Final   Parainfluenza Virus 1 NOT DETECTED NOT DETECTED Final   Parainfluenza Virus 2 NOT DETECTED NOT DETECTED Final   Parainfluenza Virus 3 NOT DETECTED NOT DETECTED Final   Parainfluenza Virus 4 NOT DETECTED NOT DETECTED Final   Respiratory Syncytial Virus NOT  DETECTED NOT DETECTED Final   Bordetella pertussis NOT DETECTED NOT DETECTED Final   Chlamydophila pneumoniae NOT DETECTED NOT DETECTED Final   Mycoplasma pneumoniae NOT DETECTED NOT DETECTED Final    Comment: Performed at [facility], [place] [address], [place], [place] [address]  Culture, expectorated sputum-assessment     Status: None   Collection Time: 03/15/18  3:58 AM  Result Value Ref Range Status   Specimen Description SPUTUM  Final   Special Requests NONE  Final   Sputum evaluation   Final    Sputum specimen not acceptable for testing.  Please recollect.   INFORMED CAUDLE,E @ [id number] BY POTEAT,S Performed at [facility], [place] [address] [place], [place] [address]    Report Status 03/15/2018 FINAL  Final         Radiology Studies: Dg Chest 2 View  Result Date: 03/14/2018 CLINICAL DATA:  Productive cough. EXAM: CHEST - 2 VIEW COMPARISON:  October 27, 2017 FINDINGS: There is a large hiatal hernia. Opacity adjacent to the hiatal hernia may be atelectasis and is unchanged or not significantly changed since February 2019. No pneumothorax. No change in the cardiomediastinal silhouette. Continued scoliotic curvature of the spine. IMPRESSION: No acute  interval change. Electronically Signed   By: Dorise Bullion III M.D   On: 03/14/2018 17:53   Ct Angio Chest Pe W/cm &/or Wo Cm  Result Date: 03/14/2018 CLINICAL DATA:  84 year old female with positive D-dimer. EXAM: CT ANGIOGRAPHY CHEST WITH CONTRAST TECHNIQUE: Multidetector CT imaging of the chest was performed using the standard protocol during bolus administration of intravenous contrast. Multiplanar CT image reconstructions and MIPs were obtained to evaluate the vascular anatomy. CONTRAST:  82mL ISOVUE-370 IOPAMIDOL (ISOVUE-370) INJECTION 76% COMPARISON:  Chest radiograph dated 03/14/2018 FINDINGS: Cardiovascular: Mild cardiomegaly. No pericardial effusion. There is mild atherosclerotic calcification of the thoracic aorta. No aneurysmal dilatation or evidence of dissection. There is no CT evidence of pulmonary embolism. Mediastinum/Nodes: Top-normal right hilar lymph node measures 8 mm. No hilar or mediastinal adenopathy. There is a large hiatal hernia containing the majority of the stomach. There is associated mass effect and anterior displacement of the heart by the hiatal hernia. Esophagus is grossly unremarkable. No mediastinal fluid collection. Lungs/Pleura: There are multiple scattered clusters of nodularity with tree-in-bud appearance bilaterally most consistent with an infectious process. Clinical correlation is recommended. A patchy area of nodular density is noted in the right middle lobe anteriorly. Small bilateral pleural effusions. There is partial compressive atelectasis of the left lung base. There is no pneumothorax. The central airways are patent. Upper Abdomen: There is a 15 mm hypodense lesion in the left lobe of the liver as well as a right renal upper pole hypodense lesion which are incompletely characterized but most likely represent cysts. There is moderate amount of stool in the visualized colon. Musculoskeletal: There is osteopenia with severe scoliosis and degenerative changes of  the spine. No acute osseous pathology. Review of the MIP images confirms the above findings. IMPRESSION: 1. No CT evidence of pulmonary embolism. 2. Bilateral clusters of nodularity most consistent with pneumonia. Clinical correlation is recommended. Small bilateral pleural effusions. 3. Large hiatal hernia containing the majority of the stomach. Electronically Signed   By: Anner Crete M.D.   On: 03/14/2018 21:29      Scheduled Meds: . aspirin EC  81 mg Oral Daily  . azithromycin      . azithromycin  250 mg Oral QHS  . benzonatate  100  mg Oral TID  . dextromethorphan-guaiFENesin  1 tablet Oral BID  . dorzolamide-timolol  1 drop Both Eyes BID  . enoxaparin (LOVENOX) injection  30 mg Subcutaneous Q24H  . feeding supplement (ENSURE ENLIVE)  237 mL Oral BID BM  . gabapentin  100 mg Oral TID  . latanoprost  1 drop Both Eyes QHS  . mouth rinse  15 mL Mouth Rinse BID  . megestrol  400 mg Oral BID  . pantoprazole  40 mg Oral BID  . polyethylene glycol  17 g Oral Daily   Continuous Infusions: . cefTRIAXone (ROCEPHIN)  IV       LOS: 1 day    Time spent in minutes: 35    Debbe Odea, MD [facility] Pager: [email or web address] Password [email or web address] 03/15/2018, 10:30 AM

## 2018-03-15 NOTE — H&P (Signed)
History and Physical    Jeanne Cruz VZD:[id number] DOB: 1928/04/13 DOA: 03/14/2018  PCP: [facility] [place]  Patient coming from: retired home  I have personally briefly reviewed patient's old medical records in [place]  Chief Complaint: cough  HPI: Jeanne Cruz is a 84 y.o. female with medical history significant of wheezing on PRN albuterol but no diagnosis of COPD or asthma, hypertension, presented to ER with cough for a week and extreme weakness.  Patient stated that she started to have productive cough 1 week ago intermittently associated with fever feeling but she never really checked her temperature.  Yesterday she felt the worst, that she could barely get out of the bed.  She had appointment with her primary care tomorrow to figure out, but her caregiver give her recommend her to see emergency room today as she did not look good.  The phlegm strict cough up is nonbloody.  She has lost appetite but this has been going on for last 2 years.   ED Course:  E ER, patient is afebrile.  She is initially tachypneic with respiratory rate 30 but currently has been normalized.  Her O2 sat is 92% on room air and not 100% on 2 L oxygen.  Labs showed sodium 128 but does not appear to be new to her.  Troponin 0 0.13 with normal baseline.  WBC normal.  D-dimer 1.03.  Normal chest x-ray.  Due to elevated d-dimer, CTPA is checked and showed no evidence of PE, but did show bilateral clusters of nodularity most consistent with pneumonia.  It also showed a small bilateral pleural effusions.  Large hiatal hernia containing the majority of the stomach.  Review of Systems: As per HPI otherwise 10 point review of systems negative.     Past Medical History:  Diagnosis Date  . Anemia   . Anxiety   . Arthritis   . Blood transfusion without reported diagnosis   . Degenerative disc disease, cervical   . GI bleeding   . Iron deficiency anemia   . Neuropathy   . Postherpetic neuralgia   .  Shingles   . Stroke [facility])     Past Surgical History:  Procedure Laterality Date  . ABDOMINAL HYSTERECTOMY    . APPENDECTOMY    . BACK SURGERY    . ESOPHAGOGASTRODUODENOSCOPY N/A 04/17/2017   Procedure: ESOPHAGOGASTRODUODENOSCOPY (EGD);  Surgeon: Carol Ada, MD;  Location: [facility];  Service: Endoscopy;  Laterality: N/A;  . TONSILLECTOMY       reports that she has never smoked. She has never used smokeless tobacco. She reports that she does not drink alcohol or use drugs.  Allergies  Allergen Reactions  . Ampicillin Diarrhea and Other (See Comments)    Has patient had a PCN reaction causing immediate rash, facial/tongue/throat swelling, SOB or lightheadedness with hypotension: No Has patient had a PCN reaction causing severe rash involving mucus membranes or skin necrosis: No Has patient had a PCN reaction that required hospitalization: No Has patient had a PCN reaction occurring within the last 10 years: No If all of the above answers are "NO", then may proceed with Cephalosporin use.  . Codeine Other (See Comments)    Reaction:  Somnolence   . Penicillins Diarrhea and Other (See Comments)    Has patient had a PCN reaction causing immediate rash, facial/tongue/throat swelling, SOB or lightheadedness with hypotension: No Has patient had a PCN reaction causing severe rash involving mucus membranes or skin necrosis: No Has patient had  a PCN reaction that required hospitalization: No Has patient had a PCN reaction occurring within the last 10 years: No If all of the above answers are "NO", then may proceed with Cephalosporin use.    Family History  Problem Relation Age of Onset  . Colon cancer Sister   . Heart attack Sister       Prior to Admission medications   Medication Sig Start Date End Date Taking? Authorizing Provider  albuterol (PROVENTIL HFA;VENTOLIN HFA) 108 (90 Base) MCG/ACT inhaler Inhale 1-2 puffs into the lungs every 6 (six) hours as needed for wheezing or  shortness of breath.     [provider]  ALPRAZolam Duanne Moron) 0.5 MG tablet Take 0.5 mg by mouth 2 (two) times daily as needed for anxiety or sleep.     [provider]  aspirin EC 81 MG tablet Take 1 tablet (81 mg total) by mouth daily. 04/28/17 04/28/18  Debbe Odea, MD  atorvastatin (LIPITOR) 20 MG tablet Take 1 tablet (20 mg total) by mouth daily at 6 PM. Patient not taking: Reported on 10/27/2017 04/28/17   Debbe Odea, MD  bimatoprost (LUMIGAN) 0.03 % ophthalmic solution Place 1 drop into both eyes at bedtime.     [provider]  dorzolamide-timolol (COSOPT) 22.3-6.8 MG/ML ophthalmic solution Place 1 drop into both eyes 2 (two) times daily.     [provider]  gabapentin (NEURONTIN) 100 MG capsule Take 100 mg by mouth every 6 (six) hours.     [provider]  Multiple Vitamin (MULTIVITAMIN WITH MINERALS) TABS tablet Take 1 tablet by mouth daily.    [provider]  nystatin (NYSTATIN) powder Apply 2 g topically 2 (two) times daily as needed (for irritation).    [provider]  pantoprazole (PROTONIX) 40 MG tablet Take 1 tablet (40 mg total) by mouth 2 (two) times daily. 04/17/17   Dessa Phi, DO  polyethylene glycol (MIRALAX / GLYCOLAX) packet Take 17 g by mouth daily.     [provider]  potassium chloride SA (K-DUR,KLOR-CON) 20 MEQ tablet Take 1 tablet (20 mEq total) by mouth 2 (two) times daily for 2 days. 10/27/17 10/29/17  Duffy Bruce, MD  traMADol (ULTRAM) 50 MG tablet Take 50 mg by mouth every 6 (six) hours as needed for moderate pain.     [provider]  Vitamin D, Ergocalciferol, (DRISDOL) 50000 units CAPS capsule Take 50,000 Units by mouth every 14 (fourteen) days.    [provider]    Physical Exam: Vitals:   03/14/18 2157 03/14/18 2200 03/14/18 2300 03/15/18 0000  BP:  137/76 99/64 102/84  Pulse:  79 89 88  Resp:  (!) 22 12 15   Temp:    98.2 F (36.8 C)  TempSrc:    Oral    SpO2: 100% 100% 98% 100%  Weight:      Height:        Constitutional: NAD, calm, comfortable Vitals:   03/14/18 2157 03/14/18 2200 03/14/18 2300 03/15/18 0000  BP:  137/76 99/64 102/84  Pulse:  79 89 88  Resp:  (!) 22 12 15   Temp:    98.2 F (36.8 C)  TempSrc:    Oral  SpO2: 100% 100% 98% 100%  Weight:      Height:      General: thin women, NAD Eyes: PERRL, lids and conjunctivae normal ENMT: Mucous membranes are moist. Posterior pharynx clear of any exudate or lesions.Normal dentition.  Neck: normal, supple, no masses, no thyromegaly Respiratory:  mildly decreased breath sound bilaterally, no wheezing, no crackles. Normal respiratory effort. No accessory muscle use.  Cardiovascular: Regular rate and rhythm, no murmurs / rubs / gallops. No extremity edema. 2+ pedal pulses. No carotid bruits.  Abdomen: no tenderness, no masses palpated. No hepatosplenomegaly. Bowel sounds positive.  Musculoskeletal: no clubbing / cyanosis. No joint deformity upper and lower extremities. Good ROM, no contractures. Normal muscle tone.  Skin: no rashes, lesions, ulcers. No induration Neurologic: CN 2-12 grossly intact. Sensation intact, DTR normal. Strength 5/5 in all 4.  Psychiatric: Normal judgment and insight. Alert and oriented x 3. Normal mood.    Labs on Admission: I have personally reviewed following labs and imaging studies  CBC: Recent Labs  Lab 03/14/18 1830  WBC 7.4  NEUTROABS 6.2  HGB 14.3  HCT 41.7  MCV 91.4  PLT 179   Basic Metabolic Panel: Recent Labs  Lab 03/14/18 1830  NA 128*  K 3.7  CL 88*  CO2 32  GLUCOSE 90  BUN 12  CREATININE 0.36*  CALCIUM 9.2   GFR: Estimated Creatinine Clearance: 29.6 mL/min (A) (by C-G formula based on SCr of 0.36 mg/dL (L)). Liver Function Tests: No results for input(s): AST, ALT, ALKPHOS, BILITOT, PROT, ALBUMIN in the last 168 hours. No results for input(s): LIPASE, AMYLASE in the last 168 hours. No results for input(s): AMMONIA in  the last 168 hours. Coagulation Profile: No results for input(s): INR, PROTIME in the last 168 hours. Cardiac Enzymes: Recent Labs  Lab 03/14/18 1830  TROPONINI 0.13*   BNP (last 3 results) No results for input(s): PROBNP in the last 8760 hours. HbA1C: No results for input(s): HGBA1C in the last 72 hours. CBG: No results for input(s): GLUCAP in the last 168 hours. Lipid Profile: No results for input(s): CHOL, HDL, LDLCALC, TRIG, CHOLHDL, LDLDIRECT in the last 72 hours. Thyroid Function Tests: No results for input(s): TSH, T4TOTAL, FREET4, T3FREE, THYROIDAB in the last 72 hours. Anemia Panel: No results for input(s): VITAMINB12, FOLATE, FERRITIN, TIBC, IRON, RETICCTPCT in the last 72 hours. Urine analysis:    Component Value Date/Time   COLORURINE STRAW (A) 10/27/2017 1625   APPEARANCEUR CLEAR 10/27/2017 1625   LABSPEC 1.004 (L) 10/27/2017 1625   PHURINE 7.0 10/27/2017 1625   GLUCOSEU NEGATIVE 10/27/2017 1625   HGBUR SMALL (A) 10/27/2017 1625   BILIRUBINUR NEGATIVE 10/27/2017 1625   KETONESUR 20 (A) 10/27/2017 1625   PROTEINUR NEGATIVE 10/27/2017 1625   NITRITE NEGATIVE 10/27/2017 1625   LEUKOCYTESUR NEGATIVE 10/27/2017 1625    Radiological Exams on Admission: Dg Chest 2 View  Result Date: 03/14/2018 CLINICAL DATA:  Productive cough. EXAM: CHEST - 2 VIEW COMPARISON:  October 27, 2017 FINDINGS: There is a large hiatal hernia. Opacity adjacent to the hiatal hernia may be atelectasis and is unchanged or not significantly changed since February 2019. No pneumothorax. No change in the cardiomediastinal silhouette. Continued scoliotic curvature of the spine. IMPRESSION: No acute interval change. Electronically Signed   By: Dorise Bullion III M.D   On: 03/14/2018 17:53   Ct Angio Chest Pe W/cm &/or Wo Cm  Result Date: 03/14/2018 CLINICAL DATA:  84 year old female with positive D-dimer. EXAM: CT ANGIOGRAPHY CHEST WITH CONTRAST TECHNIQUE: Multidetector CT imaging of the chest was  performed using the standard protocol during bolus administration of intravenous contrast. Multiplanar CT image reconstructions and MIPs were obtained to evaluate the vascular anatomy. CONTRAST:  39mL ISOVUE-370 IOPAMIDOL (ISOVUE-370) INJECTION 76% COMPARISON:  Chest radiograph dated 03/14/2018 FINDINGS: Cardiovascular: Mild cardiomegaly. No  pericardial effusion. There is mild atherosclerotic calcification of the thoracic aorta. No aneurysmal dilatation or evidence of dissection. There is no CT evidence of pulmonary embolism. Mediastinum/Nodes: Top-normal right hilar lymph node measures 8 mm. No hilar or mediastinal adenopathy. There is a large hiatal hernia containing the majority of the stomach. There is associated mass effect and anterior displacement of the heart by the hiatal hernia. Esophagus is grossly unremarkable. No mediastinal fluid collection. Lungs/Pleura: There are multiple scattered clusters of nodularity with tree-in-bud appearance bilaterally most consistent with an infectious process. Clinical correlation is recommended. A patchy area of nodular density is noted in the right middle lobe anteriorly. Small bilateral pleural effusions. There is partial compressive atelectasis of the left lung base. There is no pneumothorax. The central airways are patent. Upper Abdomen: There is a 15 mm hypodense lesion in the left lobe of the liver as well as a right renal upper pole hypodense lesion which are incompletely characterized but most likely represent cysts. There is moderate amount of stool in the visualized colon. Musculoskeletal: There is osteopenia with severe scoliosis and degenerative changes of the spine. No acute osseous pathology. Review of the MIP images confirms the above findings. IMPRESSION: 1. No CT evidence of pulmonary embolism. 2. Bilateral clusters of nodularity most consistent with pneumonia. Clinical correlation is recommended. Small bilateral pleural effusions. 3. Large hiatal hernia  containing the majority of the stomach. Electronically Signed   By: Anner Crete M.D.   On: 03/14/2018 21:29    EKG: Independently reviewed.   Assessment/Plan Principal Problem:   CAP (community acquired pneumonia) Active Problems:   Hyponatremia   Hypotension   Elevated troponin   Cough   Pneumonia   Weakness generalized    Plan: 1. CAP Admit to med telemetry for close monitoring.  Start empirically on IV Rocephin and p.o. Zithromax for community-acquired pneumonia.  Pending procalcitonin, sputum culture and viral profile.  Start was cough medicine as needed.  2.  Hyponatremia and hypotension DDX SIADH from pneumonia versus hypovolemic hyponatremia, will give 1 day gentle IV fluid if no improvement will stop the fluid and to free water restriction.  Repeat BMP in the morning.  Hold any home blood pressure medicine.  3.  Elevated troponin Etiology is unclear.  We will cycle troponins.  No concerning EKG changes.  Will check 2D echo.  4.  Generalized weakness PT OT  DVT prophylaxis: heparin [place]  Code Status: full  Family Communication: no family at bedside  Disposition Plan: To be determined  consults called: none Admission status: inpatient  Paticia Stack MD [facility] Pager [phone]  If 7PM-7AM, please contact night-coverage [email or web address] Password [email or web address]  03/15/2018, 1:35 AM

## 2018-03-16 ENCOUNTER — Inpatient Hospital Stay (HOSPITAL_COMMUNITY): Payer: Medicare Other

## 2018-03-16 DIAGNOSIS — I959 Hypotension, unspecified: Secondary | ICD-10-CM

## 2018-03-16 LAB — BASIC METABOLIC PANEL
Anion gap: 7 (ref 5–15)
BUN: 6 mg/dL — ABNORMAL LOW (ref 8–23)
CO2: 31 mmol/L (ref 22–32)
Calcium: 9.2 mg/dL (ref 8.9–10.3)
Chloride: 98 mmol/L (ref 98–111)
Creatinine, Ser: 0.42 mg/dL — ABNORMAL LOW (ref 0.44–1.00)
GFR calc Af Amer: >60 mL/min (ref >60)
GFR calc non Af Amer: >60 mL/min (ref >60)
GLUCOSE: 116 mg/dL — AB (ref 70–99)
POTASSIUM: 3.5 mmol/L (ref 3.5–5.1)
Sodium: 136 mmol/L (ref 135–145)

## 2018-03-16 MED ORDER — HEPARIN SODIUM (PORCINE) 5000 UNIT/ML IJ SOLN
5000.0000 [IU] | Freq: Two times a day (BID) | INTRAMUSCULAR | Status: DC
  Administered 2018-03-16 – 2018-03-17 (×3): 5000 [IU] via SUBCUTANEOUS
  Filled 2018-03-16 (×5): qty 1

## 2018-03-16 NOTE — Progress Notes (Signed)
Occupational Therapy Treatment Patient Details Name: Jeanne Cruz MRN: [id number] DOB: 08-19-1928 Today's Date: 03/16/2018    History of present illness Jeanne Cruz is a 84 y.o.femalewith medical history significant ofwheezing on PRN albuterol but no diagnosis of COPD or asthma, hypertension.  Admitted with hypoxia,, RR in 30s and d dimer of 1.03. CT of the chest revealed infiltrates consistent with bilateral pneumonia.      Follow Up Recommendations  Home health OT;Supervision/Assistance - 24 hour    Equipment Recommendations  None recommended by OT    Recommendations for Other Services      Precautions / Restrictions Precautions Precautions: Fall Restrictions Weight Bearing Restrictions: No       Mobility Bed Mobility Overal bed mobility: Needs Assistance Bed Mobility: Supine to Sit     Supine to sit: Min assist        Transfers Overall transfer level: Needs assistance Equipment used: Rolling walker (2 wheeled) Transfers: Sit to/from Stand Sit to Stand: Min assist         General transfer comment: VC for hand placement    Balance Overall balance assessment: Needs assistance Sitting-balance support: No upper extremity supported;Feet supported Sitting balance-Leahy Scale: Fair     Standing balance support: Single extremity supported;During functional activity Standing balance-Leahy Scale: Poor                             ADL either performed or assessed with clinical judgement   ADL Overall ADL's : Needs assistance/impaired                         Toilet Transfer: Minimal assistance;BSC;Stand-pivot;Cueing for sequencing;Cueing for safety   Toileting- Clothing Manipulation and Hygiene: Minimal assistance;Sit to/from stand;Cueing for safety;Cueing for sequencing       Functional mobility during ADLs: Minimal assistance;Rolling walker General ADL Comments: Vc for safety, hand placement and use of walker as well as  calling for A as needed. chair alarm placed     Vision Baseline Vision/History: Wears glasses Wears Glasses: At all times                    Pertinent Vitals/ Pain       Pain Assessment: No/denies pain     Prior Functioning/Environment              Frequency  Min 2X/week        Progress Toward Goals  OT Goals(current goals can now be found in the care plan section)  Progress towards OT goals: Progressing toward goals     Plan         AM-PAC PT "6 Clicks" Daily Activity     Outcome Measure   Help from another person eating meals?: A Little Help from another person taking care of personal grooming?: A Little Help from another person toileting, which includes using toliet, bedpan, or urinal?: A Little Help from another person bathing (including washing, rinsing, drying)?: A Little Help from another person to put on and taking off regular upper body clothing?: A Little Help from another person to put on and taking off regular lower body clothing?: A Little 6 Click Score: 18    End of Session Equipment Utilized During Treatment: Gait belt;Other (comment)(BSC)  OT Visit Diagnosis: Unsteadiness on feet (R26.81);Other abnormalities of gait and mobility (R26.89);Muscle weakness (generalized) (M62.81);Adult, failure to thrive (R62.7)   Activity Tolerance Patient tolerated treatment well  Patient Left with call bell/phone within reach;in chair;with chair alarm set(with SLP)   Nurse Communication Mobility status        Time: 5583-1674 OT Time Calculation (min): 24 min  Charges: OT General Charges $OT Visit: 1 Visit OT Treatments $Self Care/Home Management : 23-37 mins  [place], [place]   Payton Mccallum D 03/16/2018, 11:12 AM

## 2018-03-16 NOTE — Progress Notes (Signed)
Physical Therapy Treatment Patient Details Name: Jeanne Cruz MRN: [id number] DOB: 1928-01-16 Today's Date: 03/16/2018    History of Present Illness Jeanne Cruz is a 84 y.o.femalewith medical history significant ofwheezing on PRN albuterol but no diagnosis of COPD or asthma, hypertension.  Admitted with hypoxia,, RR in 30s and d dimer of 1.03. CT of the chest revealed infiltrates consistent with bilateral pneumonia.    PT Comments    Pt OOB in recliner sleeping on 2 lts O2 sats 98%.  Trial RA at rest 90%.  Assisted to [facility] then static standing at sink.  Assisted with amb using rollator trial RA lowest 88% with highest HR 105.  Pt tolerated distance well.  Pt plans to D/C back to [facility].    Follow Up Recommendations  Home health PT;Supervision for mobility/OOB     Equipment Recommendations  None recommended by PT    Recommendations for Other Services       Precautions / Restrictions Precautions Precautions: Fall Precaution Comments: monitor O2 Restrictions Weight Bearing Restrictions: No    Mobility  Bed Mobility Overal bed mobility: Needs Assistance Bed Mobility: Sit to Supine       Sit to supine: Min guard   General bed mobility comments: positioning with pillows per pt directions  Transfers Overall transfer level: Needs assistance Equipment used: Rolling walker (2 wheeled);4-wheeled walker Transfers: Sit to/from [employer] to Stand: Min assist;Min guard Stand pivot transfers: Min guard;Min assist       General transfer comment: assisted from recliner to [facility] then to stand for amb increased time esp with turns  Ambulation/Gait Ambulation/Gait assistance: Min guard;Supervision Gait Distance (Feet): 125 Feet Assistive device: 4-wheeled walker Gait Pattern/deviations: Step-through pattern;Step-to pattern;Decreased stride length;Trunk flexed Gait velocity: decreased   General Gait Details: amb with rollator while  monitoring RA ranged from 90 - 88 with 1/4 dyspnea  <25% VC's safety with 4 [employer]   Stairs             Wheelchair Mobility    Modified Rankin (Stroke Patients Only)       Balance                                            Cognition Arousal/Alertness: Awake/alert Behavior During Therapy: WFL for tasks assessed/performed Overall Cognitive Status: Within Functional Limits for tasks assessed                                        Exercises      General Comments        Pertinent Vitals/Pain Pain Assessment: Faces Faces Pain Scale: Hurts a little bit Pain Location: back Pain Descriptors / Indicators: Aching Pain Intervention(s): Monitored during session;Repositioned    Home Living                      Prior Function            PT Goals (current goals can now be found in the care plan section) Progress towards PT goals: Progressing toward goals    Frequency    Min 3X/week      PT Plan Current plan remains appropriate    Co-evaluation              AM-PAC  PT "6 Clicks" Daily Activity  Outcome Measure  Difficulty turning over in bed (including adjusting bedclothes, sheets and blankets)?: A Little Difficulty moving from lying on back to sitting on the side of the bed? : A Little Difficulty sitting down on and standing up from a chair with arms (e.g., wheelchair, bedside commode, etc,.)?: Unable Help needed moving to and from a bed to chair (including a wheelchair)?: A Little Help needed walking in hospital room?: A Little Help needed climbing 3-5 steps with a railing? : A Lot 6 Click Score: 15    End of Session Equipment Utilized During Treatment: Oxygen Activity Tolerance: Patient tolerated treatment well Patient left: in bed;with call bell/phone within reach Nurse Communication: Mobility status PT Visit Diagnosis: Other abnormalities of gait and mobility (R26.89);Muscle weakness (generalized)  (M62.81)     Time: 5916-3846 PT Time Calculation (min) (ACUTE ONLY): 25 min  Charges:  $Gait Training: 8-22 mins $Therapeutic Activity: 8-22 mins                    G Codes:       Rica Koyanagi  PTA [facility]  Acute  Rehab Pager      [phone]

## 2018-03-16 NOTE — Progress Notes (Signed)
PT demonstrated verbal and hands on understanding of Flutter device- productive cough at this time.  PT completed 10 exhalations into Flutter device- Sp02 90%. MD Sp02 goal =>92%, therefore PT was placed on 2 lpm [place]. RN aware.

## 2018-03-16 NOTE — Progress Notes (Addendum)
PROGRESS NOTE    Jeanne Cruz   ZOX:[id number]  DOB: 06/18/1928  DOA: 03/14/2018 PCP: [facility]   Brief Narrative:  Jeanne Cruz is a 84 y.o. female with medical history significant of wheezing on PRN albuterol but no diagnosis of COPD or asthma, hypertension, presented to ER with a productive cough for a week and extreme weakness. Noted to by hypoxic in the ED, RR in 30s and d dimer of 1.03. CT of the chest revealed infiltrates consistent with bilateral pneumonia. Caregiver is concerned that she may be aspirating.  She also admits to a steady weight loss due to a loss of appetite for the past 1-2 years.    Subjective: She still feels short of breath. Cough is maybe a little better.  Assessment & Plan:   Principal Problem:   CAP (community acquired pneumonia) - Rocephin and Zithromax, Guaifenesin DM, Tessalon, lozenges -  resp virus panel negative - interestingly, CXR today shows clearing of infiltrates although I do hear crackles in RLL and she has a cough and dyspnea. -  SLP eval> D3, think liquids but SLP also recommending an esophagram to confirm if there is esophageal dysmotility- I have ordered this - she was on 2 l O2- have weaned her off of O2 today  Active Problems:   Hyponatremia/ poor appetite - dehydrated - 7/1>> sodium 129- bolused 500 cc NS and then continued on slow IVF - sodium 136 today- will stop IVF  Grade 2 d CHF -stopping IVF today- follow      Elevated troponin  - possibly demand ischemia    Weakness generalized   Anorexia Severe protein calorie malnutrition - started Megace and Ensure - obtain PT eval   DVT prophylaxis: Lovenox Code Status: Full code Family Communication:  Disposition Plan: follow for improvement in med/surg Consultants:   none Procedures:   2 D ECHO Study Conclusions  - Left ventricle: The cavity size was normal. Wall thickness was   increased in a pattern of mild LVH. There was focal basal  hypertrophy. Systolic function was normal. The estimated ejection   fraction was in the range of 60% to 65%. Wall motion was normal;   there were no regional wall motion abnormalities. Features are   consistent with a pseudonormal left ventricular filling pattern,   with concomitant abnormal relaxation and increased filling   pressure (grade 2 diastolic dysfunction). - Aortic valve: There was mild regurgitation. - Mitral valve: Moderately calcified annulus. There was mild   regurgitation. - Pulmonary arteries: Systolic pressure was mildly increased. PA   peak pressure: 36 mm Hg (S). Antimicrobials:  Anti-infectives (From admission, onward)   Start     Dose/Rate Route Frequency Ordered Stop   03/16/18 1000  azithromycin (ZITHROMAX) tablet 250 mg  Status:  Discontinued     250 mg Oral Daily 03/15/18 0009 03/15/18 0129   03/16/18 0900  cefTRIAXone (ROCEPHIN) 1 g in sodium chloride 0.9 % 100 mL IVPB  Status:  Discontinued     1 g 200 mL/hr over 30 Minutes Intravenous Every 24 hours 03/15/18 0009 03/15/18 0125   03/15/18 2200  cefTRIAXone (ROCEPHIN) 1 g in sodium chloride 0.9 % 100 mL IVPB     1 g 200 mL/hr over 30 Minutes Intravenous Every 24 hours 03/15/18 0126 03/21/18 2159   03/15/18 2200  azithromycin (ZITHROMAX) tablet 250 mg     250 mg Oral Daily at bedtime 03/15/18 0130 03/19/18 2159   03/14/18 2237  azithromycin (ZITHROMAX) 500 MG  injection    Note to Pharmacy:  Shellee Milo   : cabinet override      03/14/18 2237 03/15/18 1044   03/14/18 2145  cefTRIAXone (ROCEPHIN) 1 g in sodium chloride 0.9 % 100 mL IVPB     1 g 200 mL/hr over 30 Minutes Intravenous  Once 03/14/18 2143 03/14/18 2235   03/14/18 2145  azithromycin (ZITHROMAX) 500 mg in sodium chloride 0.9 % 250 mL IVPB     500 mg 250 mL/hr over 60 Minutes Intravenous  Once 03/14/18 2143 03/14/18 2351       Objective: Vitals:   03/15/18 0511 03/15/18 2129 03/16/18 0521 03/16/18 1118  BP: 117/66 (!) 162/83 (!) 141/67     Pulse: 79 89 83   Resp: 12 20 18    Temp: 98.3 F (36.8 C) 98.3 F (36.8 C) 98.2 F (36.8 C)   TempSrc: Oral     SpO2: 100% 99% 96% 93%  Weight:      Height:        Intake/Output Summary (Last 24 hours) at 03/16/2018 1320 Last data filed at 03/16/2018 0217 Gross per 24 hour  Intake 1900 ml  Output -  Net 1900 ml   Filed Weights   03/14/18 1643  Weight: 38.6 kg (85 lb)    Examination: General exam: Appears comfortable  HEENT: PERRLA, oral mucosa moist, no sclera icterus or thrush Respiratory system: crackles at bases, persistent weak cough Cardiovascular system: S1 & S2 heard, RRR.   Gastrointestinal system: Abdomen soft, non-tender, nondistended. Normal bowel sound. No organomegaly Central nervous system: Alert and oriented. No focal neurological deficits. Extremities: No cyanosis, clubbing or edema Skin: No rashes or ulcers Psychiatry:  Mood & affect appropriate.     Data Reviewed: I have personally reviewed following labs and imaging studies  CBC: Recent Labs  Lab 03/14/18 1830  WBC 7.4  NEUTROABS 6.2  HGB 14.3  HCT 41.7  MCV 91.4  PLT 785   Basic Metabolic Panel: Recent Labs  Lab 03/14/18 1830 03/15/18 0808 03/16/18 0918  NA 128* 129* 136  K 3.7 3.8 3.5  CL 88* 90* 98  CO2 32 31 31  GLUCOSE 90 91 116*  BUN 12 11 6*  CREATININE 0.36* 0.35* 0.42*  CALCIUM 9.2 9.0 9.2   GFR: Estimated Creatinine Clearance: 29.6 mL/min (A) (by C-G formula based on SCr of 0.42 mg/dL (L)). Liver Function Tests: No results for input(s): AST, ALT, ALKPHOS, BILITOT, PROT, ALBUMIN in the last 168 hours. No results for input(s): LIPASE, AMYLASE in the last 168 hours. No results for input(s): AMMONIA in the last 168 hours. Coagulation Profile: No results for input(s): INR, PROTIME in the last 168 hours. Cardiac Enzymes: Recent Labs  Lab 03/14/18 1830  TROPONINI 0.13*   BNP (last 3 results) No results for input(s): PROBNP in the last 8760 hours. HbA1C: No results  for input(s): HGBA1C in the last 72 hours. CBG: No results for input(s): GLUCAP in the last 168 hours. Lipid Profile: No results for input(s): CHOL, HDL, LDLCALC, TRIG, CHOLHDL, LDLDIRECT in the last 72 hours. Thyroid Function Tests: No results for input(s): TSH, T4TOTAL, FREET4, T3FREE, THYROIDAB in the last 72 hours. Anemia Panel: No results for input(s): VITAMINB12, FOLATE, FERRITIN, TIBC, IRON, RETICCTPCT in the last 72 hours. Urine analysis:    Component Value Date/Time   COLORURINE STRAW (A) 10/27/2017 1625   APPEARANCEUR CLEAR 10/27/2017 1625   LABSPEC 1.004 (L) 10/27/2017 1625   PHURINE 7.0 10/27/2017 1625   GLUCOSEU NEGATIVE 10/27/2017  [place] (A) 10/27/2017 1625   BILIRUBINUR NEGATIVE 10/27/2017 1625   KETONESUR 20 (A) 10/27/2017 1625   PROTEINUR NEGATIVE 10/27/2017 1625   NITRITE NEGATIVE 10/27/2017 1625   LEUKOCYTESUR NEGATIVE 10/27/2017 1625   Sepsis Labs: @LABRCNTIP (procalcitonin:4,lacticidven:4) ) Recent Results (from the past 240 hour(s))  Respiratory Panel by PCR     Status: None   Collection Time: 03/15/18  3:58 AM  Result Value Ref Range Status   Adenovirus NOT DETECTED NOT DETECTED Final   Coronavirus 229E NOT DETECTED NOT DETECTED Final   Coronavirus HKU1 NOT DETECTED NOT DETECTED Final   Coronavirus NL63 NOT DETECTED NOT DETECTED Final   Coronavirus OC43 NOT DETECTED NOT DETECTED Final   Metapneumovirus NOT DETECTED NOT DETECTED Final   Rhinovirus / Enterovirus NOT DETECTED NOT DETECTED Final   Influenza A NOT DETECTED NOT DETECTED Final   Influenza B NOT DETECTED NOT DETECTED Final   Parainfluenza Virus 1 NOT DETECTED NOT DETECTED Final   Parainfluenza Virus 2 NOT DETECTED NOT DETECTED Final   Parainfluenza Virus 3 NOT DETECTED NOT DETECTED Final   Parainfluenza Virus 4 NOT DETECTED NOT DETECTED Final   Respiratory Syncytial Virus NOT DETECTED NOT DETECTED Final   Bordetella pertussis NOT DETECTED NOT DETECTED Final   Chlamydophila  pneumoniae NOT DETECTED NOT DETECTED Final   Mycoplasma pneumoniae NOT DETECTED NOT DETECTED Final    Comment: Performed at [facility], [place] [address], [place], [place] [address]  Culture, expectorated sputum-assessment     Status: None   Collection Time: 03/15/18  3:58 AM  Result Value Ref Range Status   Specimen Description SPUTUM  Final   Special Requests NONE  Final   Sputum evaluation   Final    Sputum specimen not acceptable for testing.  Please recollect.   INFORMED CAUDLE,E @ [id number] [id number] BY POTEAT,S Performed at [facility], [place] [address], [place],  [address]    Report Status 03/15/2018 FINAL  Final         Radiology Studies: Dg Chest 2 View  Result Date: 03/14/2018 CLINICAL DATA:  Productive cough. EXAM: CHEST - 2 VIEW COMPARISON:  October 27, 2017 FINDINGS: There is a large hiatal hernia. Opacity adjacent to the hiatal hernia may be atelectasis and is unchanged or not significantly changed since February 2019. No pneumothorax. No change in the cardiomediastinal silhouette. Continued scoliotic curvature of the spine. IMPRESSION: No acute interval change. Electronically Signed   By: Dorise Bullion III M.D   On: 03/14/2018 17:53   Ct Angio Chest Pe W/cm &/or Wo Cm  Result Date: 03/14/2018 CLINICAL DATA:  84 year old female with positive D-dimer. EXAM: CT ANGIOGRAPHY CHEST WITH CONTRAST TECHNIQUE: Multidetector CT imaging of the chest was performed using the standard protocol during bolus administration of intravenous contrast. Multiplanar CT image reconstructions and MIPs were obtained to evaluate the vascular anatomy. CONTRAST:  70mL ISOVUE-370 IOPAMIDOL (ISOVUE-370) INJECTION 76% COMPARISON:  Chest radiograph dated 03/14/2018 FINDINGS: Cardiovascular: Mild cardiomegaly. No pericardial effusion. There is mild atherosclerotic calcification of the thoracic aorta. No aneurysmal dilatation or evidence of dissection. There is no CT evidence of  pulmonary embolism. Mediastinum/Nodes: Top-normal right hilar lymph node measures 8 mm. No hilar or mediastinal adenopathy. There is a large hiatal hernia containing the majority of the stomach. There is associated mass effect and anterior displacement of the heart by the hiatal hernia. Esophagus is grossly unremarkable. No mediastinal fluid collection. Lungs/Pleura: There are multiple scattered clusters of nodularity with tree-in-bud appearance  bilaterally most consistent with an infectious process. Clinical correlation is recommended. A patchy area of nodular density is noted in the right middle lobe anteriorly. Small bilateral pleural effusions. There is partial compressive atelectasis of the left lung base. There is no pneumothorax. The central airways are patent. Upper Abdomen: There is a 15 mm hypodense lesion in the left lobe of the liver as well as a right renal upper pole hypodense lesion which are incompletely characterized but most likely represent cysts. There is moderate amount of stool in the visualized colon. Musculoskeletal: There is osteopenia with severe scoliosis and degenerative changes of the spine. No acute osseous pathology. Review of the MIP images confirms the above findings. IMPRESSION: 1. No CT evidence of pulmonary embolism. 2. Bilateral clusters of nodularity most consistent with pneumonia. Clinical correlation is recommended. Small bilateral pleural effusions. 3. Large hiatal hernia containing the majority of the stomach. Electronically Signed   By: Anner Crete M.D.   On: 03/14/2018 21:29   Dg Chest Port 1 View  Result Date: 03/16/2018 CLINICAL DATA:  Shortness of breath EXAM: PORTABLE CHEST 1 VIEW COMPARISON:  03/14/2018 FINDINGS: Cardiomegaly. Large hiatal hernia. No confluent airspace opacities or effusions. No acute bony abnormality. IMPRESSION: Stable cardiomegaly and large hiatal hernia.  No active disease. Electronically Signed   By: Rolm Baptise M.D.   On: 03/16/2018  09:33      Scheduled Meds: . aspirin EC  81 mg Oral Daily  . azithromycin  250 mg Oral QHS  . benzonatate  100 mg Oral TID  . dextromethorphan-guaiFENesin  1 tablet Oral BID  . dorzolamide-timolol  1 drop Both Eyes BID  . feeding supplement (ENSURE ENLIVE)  237 mL Oral BID BM  . gabapentin  100 mg Oral TID  . heparin injection (subcutaneous)  5,000 Units Subcutaneous Q12H  . latanoprost  1 drop Both Eyes QHS  . mouth rinse  15 mL Mouth Rinse BID  . megestrol  400 mg Oral BID  . pantoprazole  40 mg Oral BID  . polyethylene glycol  17 g Oral Daily   Continuous Infusions: . sodium chloride 125 mL/hr at 03/16/18 0217  . cefTRIAXone (ROCEPHIN)  IV 1 g (03/15/18 2136)     LOS: 2 days    Time spent in minutes: 35    Debbe Odea, MD [facility] Pager: [email or web address] Password [email or web address] 03/16/2018, 1:20 PM

## 2018-03-16 NOTE — Progress Notes (Addendum)
Initial Nutrition Assessment  DOCUMENTATION CODES:   Underweight, Severe malnutrition in context of chronic illness  INTERVENTION:    Ensure Enlive po BID, each supplement provides 350 kcal and 20 grams of protein  Magic cup TID with meals, each supplement provides 290 kcal and 9 grams of protein  NUTRITION DIAGNOSIS:   Severe Malnutrition related to chronic illness(hernia with increased fullness) as evidenced by energy intake < or equal to 75% for > or equal to 1 month, severe fat depletion, severe muscle depletion, moderate fat depletion, moderate muscle depletion.  GOAL:   Patient will meet greater than or equal to 90% of their needs  MONITOR:   PO intake, Supplement acceptance, Weight trends, Labs  REASON FOR ASSESSMENT:   Malnutrition Screening Tool    ASSESSMENT:   Patient with PMH significant for stroke and HTN. Presents this admission with complaints of cough and extreme weakness. Admitted for CAP and hypotension.    Pt reports having decreased appetite for two years PTA. Pt states her PCP told her to eat 6 small meals per day due to her hernia. She lives in a facility that provides 3 meals/day but states she is picky with her food options. Pt usually eats the vegetables off of her tray and sometimes the meat. She buys Ensure for herself but does not drink them frequently as they fill her up (along with most food options). SLP saw pt 7/1 and recommends DYS 3 diet with thin liquids. No meal completions charted since admit. Pt states she has not been hungry but is willing to try lunch.   Pt endorses a UBW of 115 lb the last time being at that weight two years ago. Records indicate pt weighed 85 lb 12/14/17 and show a stated weight of 85 lb this admission. Will need to obtain actual weight to determine wt loss. Nutrition-Focused physical exam completed.   Medications reviewed and include: megace BID Labs reviewed.   NUTRITION - FOCUSED PHYSICAL EXAM:    Most Recent Value   Orbital Region  Moderate depletion  Upper Arm Region  Severe depletion  Thoracic and Lumbar Region  Moderate depletion  Buccal Region  Mild depletion  Temple Region  Moderate depletion  Clavicle Bone Region  Severe depletion  Clavicle and Acromion Bone Region  Severe depletion  Scapular Bone Region  Moderate depletion  Dorsal Hand  Severe depletion  Patellar Region  Severe depletion  Anterior Thigh Region  Severe depletion  Posterior Calf Region  Severe depletion  Edema (RD Assessment)  None  Hair  Reviewed  Eyes  Reviewed  Mouth  Reviewed  Skin  Reviewed  Nails  Reviewed     Diet Order:   Diet Order           DIET DYS 3 Room service appropriate? Yes; Fluid consistency: Thin  Diet effective now          EDUCATION NEEDS:   Education needs have been addressed  Skin:  Skin Assessment: Reviewed RN Assessment  Last BM:  03/13/18  Height:   Ht Readings from Last 1 Encounters:  03/14/18 4\' 11"  (1.499 m)    Weight:   Wt Readings from Last 1 Encounters:  03/14/18 85 lb (38.6 kg)    Ideal Body Weight:  44.3 kg  BMI:  Body mass index is 17.17 kg/m.  Estimated Nutritional Needs:   Kcal:  1200-1400 kcal  Protein:  60-70 g  Fluid:  >1.2 L/day    Mariana Single RD, LDN Clinical Nutrition Pager # -  [phone]  

## 2018-03-16 NOTE — Progress Notes (Signed)
PT demonstrated hands on understanding of Flutter device. PC at this time. 

## 2018-03-17 ENCOUNTER — Inpatient Hospital Stay (HOSPITAL_COMMUNITY): Payer: Medicare Other

## 2018-03-17 DIAGNOSIS — J9601 Acute respiratory failure with hypoxia: Secondary | ICD-10-CM

## 2018-03-17 DIAGNOSIS — J189 Pneumonia, unspecified organism: Principal | ICD-10-CM

## 2018-03-17 DIAGNOSIS — E871 Hypo-osmolality and hyponatremia: Secondary | ICD-10-CM

## 2018-03-17 DIAGNOSIS — E43 Unspecified severe protein-calorie malnutrition: Secondary | ICD-10-CM

## 2018-03-17 DIAGNOSIS — R748 Abnormal levels of other serum enzymes: Secondary | ICD-10-CM

## 2018-03-17 MED ORDER — CEFDINIR 300 MG PO CAPS
300.0000 mg | ORAL_CAPSULE | Freq: Two times a day (BID) | ORAL | Status: DC
  Administered 2018-03-17 – 2018-03-18 (×2): 300 mg via ORAL
  Filled 2018-03-17 (×2): qty 1

## 2018-03-17 MED ORDER — AMOXICILLIN-POT CLAVULANATE 875-125 MG PO TABS: 1.0000 | ORAL_TABLET | Freq: Two times a day (BID) | ORAL | Status: DC

## 2018-03-17 MED ORDER — DIPHENHYDRAMINE HCL 50 MG PO CAPS
50.0000 mg | ORAL_CAPSULE | Freq: Once | ORAL | Status: DC
Start: 2018-03-17 — End: 2018-03-18

## 2018-03-17 MED ORDER — DIPHENHYDRAMINE HCL 25 MG PO CAPS
25.0000 mg | ORAL_CAPSULE | ORAL | Status: DC | PRN
  Filled 2018-03-17: qty 1

## 2018-03-17 NOTE — Progress Notes (Signed)
Physical Therapy Treatment Patient Details Name: Jeanne Cruz MRN: [id number] DOB: [birth year] Today's Date: 03/17/2018    History of Present Illness Jeanne Cruz is a 84 y.o.femalewith medical history significant ofwheezing on PRN albuterol but no diagnosis of COPD or asthma, hypertension.  Admitted with hypoxia,, RR in 30s and d dimer of 1.03. CT of the chest revealed infiltrates consistent with bilateral pneumonia.    PT Comments    Pt very cooperative and demonstrating improvement in activity tolerance and balance.   Follow Up Recommendations  Home health PT;Supervision for mobility/OOB     Equipment Recommendations  None recommended by PT    Recommendations for Other Services       Precautions / Restrictions Precautions Precautions: Fall Precaution Comments: monitor O2 Restrictions Weight Bearing Restrictions: No    Mobility  Bed Mobility Overal bed mobility: Needs Assistance Bed Mobility: Sit to Supine       Sit to supine: Min guard   General bed mobility comments: positioning with pillows per pt directions  Transfers Overall transfer level: Needs assistance Equipment used: Rolling walker (2 wheeled);4-wheeled walker Transfers: Sit to/from Stand Sit to Stand: Min guard         General transfer comment: cues for use of UEs to self assist  Ambulation/Gait Ambulation/Gait assistance: Min guard;Supervision Gait Distance (Feet): 222 Feet Assistive device: Rolling walker (2 wheeled) Gait Pattern/deviations: Step-through pattern;Step-to pattern;Decreased stride length;Trunk flexed Gait velocity: decreased   General Gait Details: min cues for position from [employer] Mobility    Modified Rankin (Stroke Patients Only)       Balance Overall balance assessment: Needs assistance Sitting-balance support: No upper extremity supported;Feet supported Sitting balance-Leahy Scale: Good     Standing balance support:  No upper extremity supported Standing balance-Leahy Scale: Fair                              Cognition Arousal/Alertness: Awake/alert Behavior During Therapy: WFL for tasks assessed/performed Overall Cognitive Status: Within Functional Limits for tasks assessed                                        Exercises      General Comments        Pertinent Vitals/Pain Pain Assessment: No/denies pain Pain Intervention(s): Monitored during session    Home Living                      Prior Function            PT Goals (current goals can now be found in the care plan section) Acute Rehab PT Goals Patient Stated Goal: To return to ALF PT Goal Formulation: With patient Time For Goal Achievement: 03/22/18 Potential to Achieve Goals: Good Progress towards PT goals: Progressing toward goals    Frequency    Min 3X/week      PT Plan Current plan remains appropriate    Co-evaluation              AM-PAC PT "6 Clicks" Daily Activity  Outcome Measure  Difficulty turning over in bed (including adjusting bedclothes, sheets and blankets)?: A Little Difficulty moving from lying on back to sitting on the side of the bed? : A Little Difficulty sitting down on  and standing up from a chair with arms (e.g., wheelchair, bedside commode, etc,.)?: A Little Help needed moving to and from a bed to chair (including a wheelchair)?: A Little Help needed walking in hospital room?: A Little Help needed climbing 3-5 steps with a railing? : A Little 6 Click Score: 18    End of Session Equipment Utilized During Treatment: Oxygen Activity Tolerance: Patient tolerated treatment well Patient left: in bed;with call bell/phone within reach Nurse Communication: Mobility status PT Visit Diagnosis: Other abnormalities of gait and mobility (R26.89);Muscle weakness (generalized) (M62.81)     Time: 8257-4935 PT Time Calculation (min) (ACUTE ONLY): 27  min  Charges:  $Gait Training: 23-37 mins                    G Codes:       Pg [phone]    Briseis Aguilera 03/17/2018, 10:25 AM

## 2018-03-17 NOTE — Progress Notes (Signed)
[facility] PROGRESS NOTE    Progress Note  Jeanne Cruz  KZL:[id number] DOB: 1928/04/30 DOA: 03/14/2018 PCP: [facility]     Brief Narrative:   Jeanne Cruz is an 84 y.o. female past medical history 10 comes to the ER with productive cough for a week and weakness, CT of the chest showed bilateral infiltrates she was found to be hypoxic and feverish in the ED  Assessment/Plan:   CAP (community acquired pneumonia): Started on admission on Rocephin and azithromycin. Respiratory panel is negative. Swallowing evaluation recommended esophagram to rule out dysmotility's. She has remained afebrile we will change antibiotics to oral azithromycin and Omnicef  Hypovolemic hyponatremia: Resolved with IV fluid hydration  Chronic diastolic heart failure Seems to be euvolemic, KVO IV fluids.  Cardiac biomarkers: In the setting of demand ischemia  Severe protein caloric malnutrition: Continue Megace and Ensure   DVT prophylaxis: lovenox Family Communication:none Disposition Plan/Barrier to D/C: home in am Code Status:     Code Status Orders  (From admission, onward)        Start     Ordered   03/15/18 0006  Full code  Continuous     03/15/18 0009    Code Status History    Date Active Date Inactive Code Status Order ID Comments User Context   04/27/2017 2137 04/29/2017 0122 Full Code [id number]  Vianne Bulls, MD ED   04/17/2017 0231 04/17/2017 2145 Full Code [id number]  Danford, Suann Larry, MD Inpatient        IV Access:    Peripheral IV   Procedures and diagnostic studies:   Dg Chest Port 1 View  Result Date: 03/16/2018 CLINICAL DATA:  Shortness of breath EXAM: PORTABLE CHEST 1 VIEW COMPARISON:  03/14/2018 FINDINGS: Cardiomegaly. Large hiatal hernia. No confluent airspace opacities or effusions. No acute bony abnormality. IMPRESSION: Stable cardiomegaly and large hiatal hernia.  No active disease. Electronically Signed   By: Rolm Baptise M.D.    On: 03/16/2018 09:33     Medical Consultants:    None.  Anti-Infectives:   Omnicef and azithromycin  Subjective:    Jeanne Cruz feels great no new complaints.  Objective:    Vitals:   03/16/18 1118 03/16/18 1547 03/16/18 2006 03/17/18 0614  BP:  (!) 148/77 (!) 156/80 124/76  Pulse:  81 90 81  Resp:  (!) 24 (!) 22 18  Temp:  (!) 97.4 F (36.3 C) 98.4 F (36.9 C) 98.3 F (36.8 C)  TempSrc:  Oral    SpO2: 93% 98% 100% 97%  Weight:      Height:        Intake/Output Summary (Last 24 hours) at 03/17/2018 0750 Last data filed at 03/16/2018 1200 Gross per 24 hour  Intake 240 ml  Output -  Net 240 ml   Filed Weights   03/14/18 1643  Weight: 38.6 kg (85 lb)    Exam: General exam: In no acute distress. Respiratory system: Good air movement and clear to auscultation. Cardiovascular system: S1 & S2 heard, RRR.  Gastrointestinal system: Abdomen is nondistended, soft and nontender.  Central nervous system: Alert and oriented. No focal neurological deficits. Extremities: No pedal edema. Skin: No rashes, lesions or ulcers Psychiatry: Judgement and insight appear normal. Mood & affect appropriate.    Data Reviewed:    Labs: Basic Metabolic Panel: Recent Labs  Lab 03/14/18 1830 03/15/18 0808 03/16/18 0918  NA 128* 129* 136  K 3.7 3.8 3.5  CL 88* 90* 98  CO2 32 31 31  GLUCOSE 90 91 116*  BUN 12 11 6*  CREATININE 0.36* 0.35* 0.42*  CALCIUM 9.2 9.0 9.2   GFR Estimated Creatinine Clearance: 29.6 mL/min (A) (by C-G formula based on SCr of 0.42 mg/dL (L)). Liver Function Tests: No results for input(s): AST, ALT, ALKPHOS, BILITOT, PROT, ALBUMIN in the last 168 hours. No results for input(s): LIPASE, AMYLASE in the last 168 hours. No results for input(s): AMMONIA in the last 168 hours. Coagulation profile No results for input(s): INR, PROTIME in the last 168 hours.  CBC: Recent Labs  Lab 03/14/18 1830  WBC 7.4  NEUTROABS 6.2  HGB 14.3  HCT 41.7    MCV 91.4  PLT 245   Cardiac Enzymes: Recent Labs  Lab 03/14/18 1830  TROPONINI 0.13*   BNP (last 3 results) No results for input(s): PROBNP in the last 8760 hours. CBG: No results for input(s): GLUCAP in the last 168 hours. D-Dimer: Recent Labs    03/14/18 1830  DDIMER 1.03*   Hgb A1c: No results for input(s): HGBA1C in the last 72 hours. Lipid Profile: No results for input(s): CHOL, HDL, LDLCALC, TRIG, CHOLHDL, LDLDIRECT in the last 72 hours. Thyroid function studies: No results for input(s): TSH, T4TOTAL, T3FREE, THYROIDAB in the last 72 hours.  Invalid input(s): FREET3 Anemia work up: No results for input(s): VITAMINB12, FOLATE, FERRITIN, TIBC, IRON, RETICCTPCT in the last 72 hours. Sepsis Labs: Recent Labs  Lab 03/14/18 1830  WBC 7.4   Microbiology Recent Results (from the past 240 hour(s))  Respiratory Panel by PCR     Status: None   Collection Time: 03/15/18  3:58 AM  Result Value Ref Range Status   Adenovirus NOT DETECTED NOT DETECTED Final   Coronavirus 229E NOT DETECTED NOT DETECTED Final   Coronavirus HKU1 NOT DETECTED NOT DETECTED Final   Coronavirus NL63 NOT DETECTED NOT DETECTED Final   Coronavirus OC43 NOT DETECTED NOT DETECTED Final   Metapneumovirus NOT DETECTED NOT DETECTED Final   Rhinovirus / Enterovirus NOT DETECTED NOT DETECTED Final   Influenza A NOT DETECTED NOT DETECTED Final   Influenza B NOT DETECTED NOT DETECTED Final   Parainfluenza Virus 1 NOT DETECTED NOT DETECTED Final   Parainfluenza Virus 2 NOT DETECTED NOT DETECTED Final   Parainfluenza Virus 3 NOT DETECTED NOT DETECTED Final   Parainfluenza Virus 4 NOT DETECTED NOT DETECTED Final   Respiratory Syncytial Virus NOT DETECTED NOT DETECTED Final   Bordetella pertussis NOT DETECTED NOT DETECTED Final   Chlamydophila pneumoniae NOT DETECTED NOT DETECTED Final   Mycoplasma pneumoniae NOT DETECTED NOT DETECTED Final    Comment: Performed at [facility], [place] [address],  [place], [place] [address]  Culture, expectorated sputum-assessment     Status: None   Collection Time: 03/15/18  3:58 AM  Result Value Ref Range Status   Specimen Description SPUTUM  Final   Special Requests NONE  Final   Sputum evaluation   Final    Sputum specimen not acceptable for testing.  Please recollect.   INFORMED CAUDLE,E @ [id number] [id number] BY POTEAT,S Performed at [facility], [place] [address] [place], [place] [id number]    Report Status 03/15/2018 FINAL  Final     Medications:   . aspirin EC  81 mg Oral Daily  . azithromycin  250 mg Oral QHS  . benzonatate  100 mg Oral TID  . dextromethorphan-guaiFENesin  1 tablet Oral BID  . dorzolamide-timolol  1 drop Both Eyes  BID  . feeding supplement (ENSURE ENLIVE)  237 mL Oral BID BM  . gabapentin  100 mg Oral TID  . heparin injection (subcutaneous)  5,000 Units Subcutaneous Q12H  . latanoprost  1 drop Both Eyes QHS  . mouth rinse  15 mL Mouth Rinse BID  . megestrol  400 mg Oral BID  . pantoprazole  40 mg Oral BID  . polyethylene glycol  17 g Oral Daily   Continuous Infusions: . cefTRIAXone (ROCEPHIN)  IV Stopped (03/16/18 2253)      LOS: 3 days   [facility] Pager [phone]  *Please refer to [email or web address], password [email or web address] to get updated schedule on who will round on this patient, as hospitalists switch teams weekly. If 7PM-7AM, please contact night-coverage at [email or web address], password [email or web address] for any overnight needs.  03/17/2018, 7:50 AM

## 2018-03-18 DIAGNOSIS — E43 Unspecified severe protein-calorie malnutrition: Secondary | ICD-10-CM

## 2018-03-18 DIAGNOSIS — R05 Cough: Secondary | ICD-10-CM

## 2018-03-18 MED ORDER — CEFDINIR 300 MG PO CAPS: 300.0000 mg | ORAL_CAPSULE | Freq: Two times a day (BID) | ORAL | 0 refills | Status: DC

## 2018-03-18 MED ORDER — AZITHROMYCIN 250 MG PO TABS: ORAL_TABLET | ORAL | 0 refills | Status: DC

## 2018-03-18 MED ORDER — TRAMADOL HCL 50 MG PO TABS: 50.0000 mg | ORAL_TABLET | Freq: Four times a day (QID) | ORAL | 0 refills | Status: AC | PRN

## 2018-03-18 MED ORDER — ALPRAZOLAM 0.5 MG PO TABS: 0.5000 mg | ORAL_TABLET | Freq: Two times a day (BID) | ORAL | 0 refills | Status: AC | PRN

## 2018-03-18 NOTE — Progress Notes (Signed)
LCSW notified patient from [facility].  Patient from [place] at [facility]. PT recommending  Home Health. LCSW notified RNCM.   Carolin Coy [place]

## 2018-03-18 NOTE — Discharge Summary (Signed)
Physician Discharge Summary  Jeanne Cruz:[id number] DOB: [birth year] DOA: 03/14/2018  PCP: [facility]  Admit date: 03/14/2018 Discharge date: 03/18/2018  Admitted From: home Disposition:  Home  Recommendations for Outpatient Follow-up:  1. Follow up with PCP in 1-2 weeks 2. Please obtain BMP/CBC in one week 3. Please follow up on the following pending results:  Home Health:NO Equipment/Devices:none  Discharge Condition:stable CODE STATUS:full Diet recommendation: Heart Healthy  Brief/Interim Summary: 84 y.o. 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 She defervesced she fell while she continue empiric antibiotics as an outpatient.  Hypovolemic hyponatremia: Resolved with IV fluid hydration.  Chronic diastolic heart failure: She seems to be euvolemic no changes in her medication.  Elevated troponins: In the setting of demand ischemia she denies any chest pain they remain flat.  Severe protein caloric malnutrition: Continue Megace and Ensure and as an outpatient.  Discharge Instructions  Discharge Instructions    Diet - low sodium heart healthy   Complete by:  As directed    Increase activity slowly   Complete by:  As directed      Allergies as of 03/18/2018      Reactions   Ampicillin Diarrhea, Other (See Comments)   Has patient had a PCN reaction causing immediate rash, facial/tongue/throat swelling,  SOB or lightheadedness with hypotension: No Has patient had a PCN reaction causing severe rash involving mucus membranes or skin necrosis: No Has patient had a PCN reaction that required hospitalization: No Has patient had a PCN reaction occurring within the last 10 years: No If all of the above answers are "NO", then may proceed with Cephalosporin use.   Codeine Other (See Comments)   Reaction:  Somnolence    Penicillins Diarrhea, Other (See Comments)   Has patient had a PCN reaction causing immediate rash, facial/tongue/throat swelling, SOB or lightheadedness with hypotension: No Has patient had a PCN reaction causing severe rash involving mucus membranes or skin necrosis: No Has patient had a PCN reaction that required hospitalization: No Has patient had a PCN reaction occurring within the last 10 years: No If all of the above answers are "NO", then may proceed with Cephalosporin use.      Medication List    TAKE these medications   albuterol 108 (90 Base) MCG/ACT inhaler Commonly known as:  PROVENTIL HFA;VENTOLIN HFA Inhale 1-2 puffs into the lungs every 6 (six) hours as needed for wheezing or shortness of breath.   ALPRAZolam 0.5 MG tablet Commonly known as:  XANAX Take 1 tablet (0.5 mg total) by mouth 2 (two) times daily as needed for anxiety or sleep.   aspirin EC 81 MG tablet Take 1 tablet (81 mg total) by mouth daily.   atorvastatin 20 MG tablet Commonly known as:  LIPITOR Take 1 tablet (20 mg total) by mouth daily at 6 PM.   azithromycin 250 MG tablet Commonly known as:  ZITHROMAX Take 1 tab daily   bimatoprost 0.03 % ophthalmic solution Commonly known as:  LUMIGAN Place  1 drop into both eyes at bedtime.   cefdinir 300 MG capsule Commonly known as:  OMNICEF Take 1 capsule (300 mg total) by mouth every 12 (twelve) hours.   citalopram 10 MG tablet Commonly known as:  CELEXA Take 10 mg by mouth daily.   dorzolamide-timolol 22.3-6.8 MG/ML ophthalmic  solution Commonly known as:  COSOPT Place 1 drop into both eyes 2 (two) times daily.   multivitamin with minerals Tabs tablet Take 1 tablet by mouth daily.   NEURONTIN 100 MG capsule Generic drug:  gabapentin Take 100 mg by mouth 3 (three) times daily.   nystatin powder Generic drug:  nystatin Apply 2 g topically 2 (two) times daily as needed (for irritation).   pantoprazole 40 MG tablet Commonly known as:  PROTONIX Take 1 tablet (40 mg total) by mouth 2 (two) times daily.   polyethylene glycol packet Commonly known as:  MIRALAX / GLYCOLAX Take 17 g by mouth daily.   potassium chloride SA 20 MEQ tablet Commonly known as:  K-DUR,KLOR-CON Take 1 tablet (20 mEq total) by mouth 2 (two) times daily for 2 days.   traMADol 50 MG tablet Commonly known as:  ULTRAM Take 1 tablet (50 mg total) by mouth every 6 (six) hours as needed for moderate pain.   VITAMIN B COMPLEX IJ Inject as directed every 30 (thirty) days.   Vitamin D (Ergocalciferol) 50000 units Caps capsule Commonly known as:  DRISDOL Take 50,000 Units by mouth every 14 (fourteen) days.       Allergies  Allergen Reactions  . Ampicillin Diarrhea and Other (See Comments)    Has patient had a PCN reaction causing immediate rash, facial/tongue/throat swelling, SOB or lightheadedness with hypotension: No Has patient had a PCN reaction causing severe rash involving mucus membranes or skin necrosis: No Has patient had a PCN reaction that required hospitalization: No Has patient had a PCN reaction occurring within the last 10 years: No If all of the above answers are "NO", then may proceed with Cephalosporin use.  . Codeine Other (See Comments)    Reaction:  Somnolence   . Penicillins Diarrhea and Other (See Comments)    Has patient had a PCN reaction causing immediate rash, facial/tongue/throat swelling, SOB or lightheadedness with hypotension: No Has patient had a PCN reaction causing severe rash involving mucus  membranes or skin necrosis: No Has patient had a PCN reaction that required hospitalization: No Has patient had a PCN reaction occurring within the last 10 years: No If all of the above answers are "NO", then may proceed with Cephalosporin use.    Consultations:  nONE   Procedures/Studies: Dg Chest 2 View  Result Date: 03/14/2018 CLINICAL DATA:  Productive cough. EXAM: CHEST - 2 VIEW COMPARISON:  October 27, 2017 FINDINGS: There is a large hiatal hernia. Opacity adjacent to the hiatal hernia may be atelectasis and is unchanged or not significantly changed since February 2019. No pneumothorax. No change in the cardiomediastinal silhouette. Continued scoliotic curvature of the spine. IMPRESSION: No acute interval change. Electronically Signed   By: Dorise Bullion III M.D   On: 03/14/2018 17:53   Ct Angio Chest Pe W/cm &/or Wo Cm  Result Date: 03/14/2018 CLINICAL DATA:  84 year old female with positive D-dimer. EXAM: CT ANGIOGRAPHY CHEST WITH CONTRAST TECHNIQUE: Multidetector CT imaging of the chest was performed using the standard protocol during bolus administration of intravenous contrast. Multiplanar CT image reconstructions and MIPs were obtained to evaluate the vascular anatomy. CONTRAST:  64mL ISOVUE-370 IOPAMIDOL (ISOVUE-370) INJECTION  76% COMPARISON:  Chest radiograph dated 03/14/2018 FINDINGS: Cardiovascular: Mild cardiomegaly. No pericardial effusion. There is mild atherosclerotic calcification of the thoracic aorta. No aneurysmal dilatation or evidence of dissection. There is no CT evidence of pulmonary embolism. Mediastinum/Nodes: Top-normal right hilar lymph node measures 8 mm. No hilar or mediastinal adenopathy. There is a large hiatal hernia containing the majority of the stomach. There is associated mass effect and anterior displacement of the heart by the hiatal hernia. Esophagus is grossly unremarkable. No mediastinal fluid collection. Lungs/Pleura: There are multiple scattered  clusters of nodularity with tree-in-bud appearance bilaterally most consistent with an infectious process. Clinical correlation is recommended. A patchy area of nodular density is noted in the right middle lobe anteriorly. Small bilateral pleural effusions. There is partial compressive atelectasis of the left lung base. There is no pneumothorax. The central airways are patent. Upper Abdomen: There is a 15 mm hypodense lesion in the left lobe of the liver as well as a right renal upper pole hypodense lesion which are incompletely characterized but most likely represent cysts. There is moderate amount of stool in the visualized colon. Musculoskeletal: There is osteopenia with severe scoliosis and degenerative changes of the spine. No acute osseous pathology. Review of the MIP images confirms the above findings. IMPRESSION: 1. No CT evidence of pulmonary embolism. 2. Bilateral clusters of nodularity most consistent with pneumonia. Clinical correlation is recommended. Small bilateral pleural effusions. 3. Large hiatal hernia containing the majority of the stomach. Electronically Signed   By: Anner Crete M.D.   On: 03/14/2018 21:29   Dg Esophagus  Result Date: 03/17/2018 CLINICAL DATA:  84 year old female with dysphagia. Large hiatal hernia. Query aspiration. EXAM: ESOPHOGRAM/BARIUM SWALLOW TECHNIQUE: Single contrast examination was performed using  thin barium. FLUOROSCOPY TIME:  Fluoroscopy Time:  1 minutes 24 seconds Radiation Exposure Index (if provided by the fluoroscopic device): 11.2 mGy Number of Acquired Spot Images: 0 COMPARISON:  CTA chest 03/14/2018. CT Abdomen and Pelvis 10/17/2017, 11/28/2005. Prior esophagram 08/05/2004. FINDINGS: A single contrast study was undertaken with the patient inclined on the fluoroscopy table, she tolerated this well and without difficulty. 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 Largely absent esophageal motility and fixed corkscrew type  configuration (maximal in the midthoracic esophagus) is noted. Superimposed intrathoracic stomach. The duodenum bulb is also located above the diaphragm. See series 16. In 2005 about 1/3 of the stomach was intrathoracic. Much of the PO barium was retained within the intrathoracic stomach, but there was slow transit of contrast through the duodenum into the abdomen and into proximal small bowel loops as demonstrated on series 20. Some spontaneous gastric reflux or to and fro motion of contrast was noted in the distal and midthoracic esophagus. Multiple swallows were observed with fluoroscopy and no aspiration occurred. No coughing occurred. IMPRESSION: 1. Positive for corkscrew esophagus and completely intrathoracic stomach which have both progressed since 2005. The 1st portion of the duodenum is also intrathoracic. 2. Negative for aspiration. 3. Negative for obstruction; slow transit of contrast from the stomach through the duodenum and into the proximal small bowel. 4. Positive for intermittent gastroesophageal reflux. Electronically Signed   By: Genevie Ann M.D.   On: 03/17/2018 12:07   Dg Chest Port 1 View  Result Date: 03/16/2018 CLINICAL DATA:  Shortness of breath EXAM: PORTABLE CHEST 1 VIEW COMPARISON:  03/14/2018 FINDINGS: Cardiomegaly. Large hiatal hernia. No confluent airspace opacities or effusions. No acute bony abnormality. IMPRESSION: Stable cardiomegaly and large hiatal hernia.  No active disease.  Electronically Signed   By: Rolm Baptise M.D.   On: 03/16/2018 09:33    Subjective: No complains  Discharge Exam: Vitals:   03/18/18 0516 03/18/18 0749  BP: 130/79   Pulse: 85   Resp: (!) 24   Temp: 97.8 F (36.6 C)   SpO2: 99% 95%   Vitals:   03/17/18 1441 03/17/18 2127 03/18/18 0516 03/18/18 0749  BP: (!) 105/59 115/72 130/79   Pulse: 78 98 85   Resp: 20 17 (!) 24   Temp: 98 F (36.7 C) 98.5 F (36.9 C) 97.8 F (36.6 C)   TempSrc: Oral  Oral   SpO2: 98% 98% 99% 95%  Weight:       Height:        General: Pt is alert, awake, not in acute distress Cardiovascular: RRR, S1/S2 +, no rubs, no gallops Respiratory: CTA bilaterally, no wheezing, no rhonchi Abdominal: Soft, NT, ND, bowel sounds + Extremities: no edema, no cyanosis    The results of significant diagnostics from this hospitalization (including imaging, microbiology, ancillary and laboratory) are listed below for reference.     Microbiology: Recent Results (from the past 240 hour(s))  Respiratory Panel by PCR     Status: None   Collection Time: 03/15/18  3:58 AM  Result Value Ref Range Status   Adenovirus NOT DETECTED NOT DETECTED Final   Coronavirus 229E NOT DETECTED NOT DETECTED Final   Coronavirus HKU1 NOT DETECTED NOT DETECTED Final   Coronavirus NL63 NOT DETECTED NOT DETECTED Final   Coronavirus OC43 NOT DETECTED NOT DETECTED Final   Metapneumovirus NOT DETECTED NOT DETECTED Final   Rhinovirus / Enterovirus NOT DETECTED NOT DETECTED Final   Influenza A NOT DETECTED NOT DETECTED Final   Influenza B NOT DETECTED NOT DETECTED Final   Parainfluenza Virus 1 NOT DETECTED NOT DETECTED Final   Parainfluenza Virus 2 NOT DETECTED NOT DETECTED Final   Parainfluenza Virus 3 NOT DETECTED NOT DETECTED Final   Parainfluenza Virus 4 NOT DETECTED NOT DETECTED Final   Respiratory Syncytial Virus NOT DETECTED NOT DETECTED Final   Bordetella pertussis NOT DETECTED NOT DETECTED Final   Chlamydophila pneumoniae NOT DETECTED NOT DETECTED Final   Mycoplasma pneumoniae NOT DETECTED NOT DETECTED Final    Comment: Performed at [facility], [place]. [address] [place], [place] [address]  Culture, expectorated sputum-assessment     Status: None   Collection Time: 03/15/18  3:58 AM  Result Value Ref Range Status   Specimen Description SPUTUM  Final   Special Requests NONE  Final   Sputum evaluation   Final    Sputum specimen not acceptable for testing.  Please recollect.   INFORMED CAUDLE,E @ [id number] ZD [id number] BY  POTEAT,S Performed at [facility], [place] [address], [place], [place] [address]    Report Status 03/15/2018 FINAL  Final     Labs: BNP (last 3 results) Recent Labs    04/16/17 1636 10/27/17 1426 03/14/18 1830  BNP 59.1 81.8 95.6   Basic Metabolic Panel: Recent Labs  Lab 03/14/18 1830 03/15/18 0808 03/16/18 0918  NA 128* 129* 136  K 3.7 3.8 3.5  CL 88* 90* 98  CO2 32 31 31  GLUCOSE 90 91 116*  BUN 12 11 6*  CREATININE 0.36* 0.35* 0.42*  CALCIUM 9.2 9.0 9.2   Liver Function Tests: No results for input(s): AST, ALT, ALKPHOS, BILITOT, PROT, ALBUMIN in the last 168 hours. No results for input(s): LIPASE, AMYLASE in the last 168 hours.  No results for input(s): AMMONIA in the last 168 hours. CBC: Recent Labs  Lab 03/14/18 1830  WBC 7.4  NEUTROABS 6.2  HGB 14.3  HCT 41.7  MCV 91.4  PLT 245   Cardiac Enzymes: Recent Labs  Lab 03/14/18 1830  TROPONINI 0.13*   BNP: Invalid input(s): POCBNP CBG: No results for input(s): GLUCAP in the last 168 hours. D-Dimer No results for input(s): DDIMER in the last 72 hours. Hgb A1c No results for input(s): HGBA1C in the last 72 hours. Lipid Profile No results for input(s): CHOL, HDL, LDLCALC, TRIG, CHOLHDL, LDLDIRECT in the last 72 hours. Thyroid function studies No results for input(s): TSH, T4TOTAL, T3FREE, THYROIDAB in the last 72 hours.  Invalid input(s): FREET3 Anemia work up No results for input(s): VITAMINB12, FOLATE, FERRITIN, TIBC, IRON, RETICCTPCT in the last 72 hours. Urinalysis    Component Value Date/Time   COLORURINE STRAW (A) 10/27/2017 1625   APPEARANCEUR CLEAR 10/27/2017 1625   LABSPEC 1.004 (L) 10/27/2017 1625   PHURINE 7.0 10/27/2017 1625   GLUCOSEU NEGATIVE 10/27/2017 1625   HGBUR SMALL (A) 10/27/2017 1625   BILIRUBINUR NEGATIVE 10/27/2017 1625   KETONESUR 20 (A) 10/27/2017 1625   PROTEINUR NEGATIVE 10/27/2017 1625   NITRITE NEGATIVE 10/27/2017 1625   LEUKOCYTESUR NEGATIVE  10/27/2017 1625   Sepsis Labs Invalid input(s): PROCALCITONIN,  WBC,  LACTICIDVEN Microbiology Recent Results (from the past 240 hour(s))  Respiratory Panel by PCR     Status: None   Collection Time: 03/15/18  3:58 AM  Result Value Ref Range Status   Adenovirus NOT DETECTED NOT DETECTED Final   Coronavirus 229E NOT DETECTED NOT DETECTED Final   Coronavirus HKU1 NOT DETECTED NOT DETECTED Final   Coronavirus NL63 NOT DETECTED NOT DETECTED Final   Coronavirus OC43 NOT DETECTED NOT DETECTED Final   Metapneumovirus NOT DETECTED NOT DETECTED Final   Rhinovirus / Enterovirus NOT DETECTED NOT DETECTED Final   Influenza A NOT DETECTED NOT DETECTED Final   Influenza B NOT DETECTED NOT DETECTED Final   Parainfluenza Virus 1 NOT DETECTED NOT DETECTED Final   Parainfluenza Virus 2 NOT DETECTED NOT DETECTED Final   Parainfluenza Virus 3 NOT DETECTED NOT DETECTED Final   Parainfluenza Virus 4 NOT DETECTED NOT DETECTED Final   Respiratory Syncytial Virus NOT DETECTED NOT DETECTED Final   Bordetella pertussis NOT DETECTED NOT DETECTED Final   Chlamydophila pneumoniae NOT DETECTED NOT DETECTED Final   Mycoplasma pneumoniae NOT DETECTED NOT DETECTED Final    Comment: Performed at [facility], [place] [address], [place], [place] [address]  Culture, expectorated sputum-assessment     Status: None   Collection Time: 03/15/18  3:58 AM  Result Value Ref Range Status   Specimen Description SPUTUM  Final   Special Requests NONE  Final   Sputum evaluation   Final    Sputum specimen not acceptable for testing.  Please recollect.   INFORMED CAUDLE,E @ 2353 IR [id number] BY POTEAT,S Performed at [facility], [place] [address] [place], [place] [address]    Report Status 03/15/2018 FINAL  Final     Time coordinating discharge: 35 minutes  SIGNED:   Charlynne Cousins, MD  [facility] 03/18/2018, 9:42 AM Pager   If 7PM-7AM, please contact  night-coverage [email or web address] Password [email or web address]

## 2018-03-18 NOTE — Progress Notes (Signed)
  Speech Language Pathology Treatment: Dysphagia  Patient Details Name: Jeanne Cruz MRN: [id number] DOB: 11/11/1927 Today's Date: 03/18/2018 Time: 4628-6381 SLP Time Calculation (min) (ACUTE ONLY): 36 min  Assessment / Plan / Recommendation Clinical Impression  Pt seen to assess po tolerance and provide mitigation strategies for chronic esophageal deficits.  SLP observed her consuming lunch including [place], greens and tea.  She eats very slowly and carefully and continues to report poor appetite.  She did cough immediately post swallow of tea from straw - but not observed with further intake from a cup.  Advised her to discontinue straw use if decreases episodes. Pt reports she has difficulty with strangling even without po intake = suspect due to known esophageal deficits.   Pt states "I'm tired and I want to go home to see my loved ones" - pointing up toward ceiling.  Provided her with emotional support and advised her to speak to her family and her primary MD regarding her feelings.  Pt states "In the last five days since I've been here, I realize even more how tired I am".    Provided pt with written compensation strategies for her esophageal deficits.  Recommended she maximize liquid nutrition if easier for her to swallow.    All education completed with pt using teach back.  No further SLP needed.  Thanks!   HPI HPI: 84 yo female adm to [facility] with respiratory deficits- weakness and cough x1 week - found to have pna.  PMH + for COPD, CVA, shingles, malnutrition.  Pt was hypoxic with RR 30 and has a large hiatal hernia.  Pt admits to h/o dysphagia - at least back to 2005 - right cerebellum - subacute ischemia 04/2017.   Pt admits to decreased appetite and worsening dysphagia.  Pt is s/p esophagram showing progression of esophageal dysphagia, corkscrew esophagus, motility deficits and reflux.  Pt reports MD advised pt is not a surgical candidate.  Follow up SLP to provide mitigation strategies  for esophageal dysphagia.       SLP Plan  All goals met       Recommendations  Medication Administration: Whole meds with puree Compensations: Slow rate;Small sips/bites;Other (Comment)(drink liquids t/o meal) Postural Changes and/or Swallow Maneuvers: Seated upright 90 degrees;Upright 30-60 min after meal                Oral Care Recommendations: Oral care BID SLP Visit Diagnosis: Dysphagia, unspecified (R13.10);Dysphagia, pharyngoesophageal phase (R13.14) Plan: All goals met       GO                Macario Golds 03/18/2018, 2:33 PM   Luanna Salk, [facility] SLP [phone]

## 2018-05-29 ENCOUNTER — Encounter (HOSPITAL_BASED_OUTPATIENT_CLINIC_OR_DEPARTMENT_OTHER): Payer: Self-pay | Admitting: *Deleted

## 2018-05-29 ENCOUNTER — Emergency Department (HOSPITAL_BASED_OUTPATIENT_CLINIC_OR_DEPARTMENT_OTHER)
Admission: EM | Admit: 2018-05-29 | Discharge: 2018-05-29 | Disposition: A | Discharge status: Home / Self Care | Payer: Medicare Other | Attending: Emergency Medicine | Admitting: Emergency Medicine

## 2018-05-29 ENCOUNTER — Emergency Department (HOSPITAL_BASED_OUTPATIENT_CLINIC_OR_DEPARTMENT_OTHER): Payer: Medicare Other

## 2018-05-29 ENCOUNTER — Other Ambulatory Visit: Payer: Self-pay

## 2018-05-29 DIAGNOSIS — Z8673 Personal history of transient ischemic attack (TIA), and cerebral infarction without residual deficits: Secondary | ICD-10-CM | POA: Diagnosis not present

## 2018-05-29 DIAGNOSIS — E860 Dehydration: Secondary | ICD-10-CM | POA: Insufficient documentation

## 2018-05-29 DIAGNOSIS — R531 Weakness: Secondary | ICD-10-CM | POA: Insufficient documentation

## 2018-05-29 DIAGNOSIS — R0602 Shortness of breath: Secondary | ICD-10-CM | POA: Diagnosis not present

## 2018-05-29 DIAGNOSIS — K449 Diaphragmatic hernia without obstruction or gangrene: Secondary | ICD-10-CM | POA: Diagnosis not present

## 2018-05-29 LAB — URINALYSIS, ROUTINE W REFLEX MICROSCOPIC
Bilirubin Urine: NEGATIVE
GLUCOSE, UA: NEGATIVE mg/dL
KETONES UR: NEGATIVE mg/dL
LEUKOCYTES UA: NEGATIVE
NITRITE: NEGATIVE
PROTEIN: NEGATIVE mg/dL
Specific Gravity, Urine: 1.01 (ref 1.005–1.030)
pH: 7 (ref 5.0–8.0)

## 2018-05-29 LAB — BASIC METABOLIC PANEL
Anion gap: 6 (ref 5–15)
BUN: 16 mg/dL (ref 8–23)
CHLORIDE: 93 mmol/L — AB (ref 98–111)
CO2: 32 mmol/L (ref 22–32)
CREATININE: 0.59 mg/dL (ref 0.44–1.00)
Calcium: 9 mg/dL (ref 8.9–10.3)
GFR calc non Af Amer: >60 mL/min (ref >60)
Glucose, Bld: 93 mg/dL (ref 70–99)
POTASSIUM: 3.9 mmol/L (ref 3.5–5.1)
SODIUM: 131 mmol/L — AB (ref 135–145)

## 2018-05-29 LAB — URINALYSIS, MICROSCOPIC (REFLEX): WBC UA: NONE SEEN WBC/hpf (ref 0–5)

## 2018-05-29 LAB — CBC
HEMATOCRIT: 43 % (ref 36.0–46.0)
Hemoglobin: 14.3 g/dL (ref 12.0–15.0)
MCH: 31.4 pg (ref 26.0–34.0)
MCHC: 33.3 g/dL (ref 30.0–36.0)
MCV: 94.3 fL (ref 78.0–100.0)
PLATELETS: 224 10*3/uL (ref 150–400)
RBC: 4.56 MIL/uL (ref 3.87–5.11)
RDW: 15.3 % (ref 11.5–15.5)
WBC: 5 10*3/uL (ref 4.0–10.5)

## 2018-05-29 LAB — TROPONIN I

## 2018-05-29 LAB — MAGNESIUM: MAGNESIUM: 2.2 mg/dL (ref 1.7–2.4)

## 2018-05-29 LAB — HEPATIC FUNCTION PANEL
ALBUMIN: 3.9 g/dL (ref 3.5–5.0)
ALK PHOS: 72 U/L (ref 38–126)
ALT: 23 U/L (ref 0–44)
AST: 25 U/L (ref 15–41)
Bilirubin, Direct: <0.1 mg/dL (ref 0.0–0.2)
TOTAL PROTEIN: 6.4 g/dL — AB (ref 6.5–8.1)
Total Bilirubin: 0.6 mg/dL (ref 0.3–1.2)

## 2018-05-29 LAB — CBG MONITORING, ED: GLUCOSE-CAPILLARY: 83 mg/dL (ref 70–99)

## 2018-05-29 LAB — BRAIN NATRIURETIC PEPTIDE: B NATRIURETIC PEPTIDE 5: 46.3 pg/mL (ref 0.0–100.0)

## 2018-05-29 MED ORDER — IOPAMIDOL (ISOVUE-370) INJECTION 76%
100.0000 mL | Freq: Once | INTRAVENOUS | Status: AC | PRN
  Administered 2018-05-29: 100 mL via INTRAVENOUS

## 2018-05-29 MED ORDER — LACTATED RINGERS IV BOLUS
1000.0000 mL | Freq: Once | INTRAVENOUS | Status: AC
  Administered 2018-05-29: 1000 mL via INTRAVENOUS

## 2018-05-29 NOTE — ED Notes (Signed)
Pt drinking PO contrast for CT APW d/t body weight, per [facility] radiology protocol Will do all scans at the same time

## 2018-05-29 NOTE — ED Notes (Signed)
Claimed of feeling weak.

## 2018-05-29 NOTE — ED Triage Notes (Signed)
Pt and family reports pt has had increased weakness and "feeling bad" for the last 4-5 weeks. States she saw her PCP 3 weeks ago and "they don't know what's wrong". She also saw her GI doctor 2 weeks ago and was told "all her labs were go". Pt states she feels weak and has no energy.

## 2018-05-29 NOTE — ED Provider Notes (Signed)
Emergency Department Provider Note   I have reviewed the triage vital signs and the nursing notes.   HISTORY  Chief Complaint Weakness   HPI Jeanne Cruz is a 84 y.o. female who is here with multiple complaints of unclear etiology.  It sounds that the patient has had 3 to 5 weeks of progressively worsening weight loss, decreased appetite, fatigue, post prandial cough and generalized weakness.  Has had some shortness of breath and decreased urine output.  She is also had decreased bowel movements.  No rashes.  She did fall a couple weeks ago before this started but unclear if there is any injuries or not.  Has some headaches. No other associated or modifying symptoms.    Past Medical History:  Diagnosis Date  . Anemia   . Anxiety   . Arthritis   . Blood transfusion without reported diagnosis   . Degenerative disc disease, cervical   . GI bleeding   . Iron deficiency anemia   . Neuropathy   . Postherpetic neuralgia   . Shingles   . Stroke [facility])     Patient Active Problem List   Diagnosis Date Noted  . Protein-calorie malnutrition, severe 03/17/2018  . Elevated troponin 03/15/2018  . Weakness generalized 03/15/2018  . Anorexia 03/15/2018  . CAP (community acquired pneumonia) 03/14/2018  . Hypotension 03/14/2018  . Erosive gastropathy 04/28/2017  . Ischemic stroke ([place]) 04/27/2017  . Hyponatremia 04/27/2017  . Normocytic anemia   . Acute blood loss anemia 04/17/2017  . GI bleed 04/16/2017  . GLAUCOMA 04/17/2008  . COLONIC POLYPS 04/12/2008  . CONSTIPATION 04/12/2008  . BRONCHITIS, RECURRENT 09/01/2007  . NECK AND BACK PAIN 09/01/2007  . Anxiety state 06/28/2007  . HIATAL HERNIA 06/28/2007  . DEGENERATIVE JOINT DISEASE 06/28/2007  . SCOLIOSIS 06/28/2007  . ANEMIA, HX OF 06/28/2007    Past Surgical History:  Procedure Laterality Date  . ABDOMINAL HYSTERECTOMY    . APPENDECTOMY    . BACK SURGERY    . ESOPHAGOGASTRODUODENOSCOPY N/A 04/17/2017   Procedure:  ESOPHAGOGASTRODUODENOSCOPY (EGD);  Surgeon: Carol Ada, MD;  Location: [facility];  Service: Endoscopy;  Laterality: N/A;  . TONSILLECTOMY      Current Outpatient Rx  . Order #: [id number] Class: Print  . Order #: [id number] Class: Historical Med  . Order #: [id number] Class: Historical Med  . Order #: [id number] Class: Historical Med  . Order #: [id number] Class: Historical Med  . Order #: [id number] Class: Normal  . Order #: [id number] Class: Print  . Order #: [id number] Class: Historical Med  . Order #: [id number] Class: Normal  . Order #: [id number] Class: Historical Med  . Order #: [id number] Class: Historical Med  . Order #: [id number] Class: Historical Med  . Order #: [id number] Class: Normal  . Order #: [id number] Class: Historical Med    Allergies Ampicillin; Codeine; and Penicillins  Family History  Problem Relation Age of Onset  . Colon cancer Sister   . Heart attack Sister     Social History Social History   Tobacco Use  . Smoking status: Never Smoker  . Smokeless tobacco: Never Used  Substance Use Topics  . Alcohol use: No  . Drug use: No    Review of Systems  All other systems negative except as documented in the HPI. All pertinent positives and negatives as reviewed in the HPI. ____________________________________________   PHYSICAL EXAM:  VITAL SIGNS: ED Triage Vitals  Enc Vitals Group     BP 05/29/18 1451 (!) 149/72     Pulse Rate 05/29/18 1451 84  Resp 05/29/18 1451 16     Temp 05/29/18 1451 98.6 F (37 C)     Temp Source 05/29/18 1451 Oral     SpO2 05/29/18 1451 95 %     Weight 05/29/18 1453 88 lb (39.9 kg)     Height 05/29/18 1453 4\' 11"  (1.499 m)    Constitutional: Alert and oriented. Well appearing and in no acute distress. Eyes: Conjunctivae are normal. PERRL. EOMI. Sunken.  Head: Atraumatic. Nose: No congestion/rhinnorhea. Mouth/Throat: Mucous membranes are moist.  Oropharynx non-erythematous. Neck: No stridor.  No meningeal signs.   Cardiovascular:  Normal rate, regular rhythm. Good peripheral circulation. Grossly normal heart sounds.   Respiratory: Normal respiratory effort.  No retractions. Lungs CTAB. Gastrointestinal: Soft and diffusely tender without rebound or guarding. Distended and tympanic to percussion.  Musculoskeletal: No lower extremity tenderness nor edema. No gross deformities of extremities. Neurologic:  Normal speech and language. No gross focal neurologic deficits are appreciated.  Skin:  Skin is warm, dry and intact. No rash noted.   ____________________________________________   LABS (all labs ordered are listed, but only abnormal results are displayed)  Labs Reviewed  BASIC METABOLIC PANEL - Abnormal; Notable for the following components:      Result Value   Sodium 131 (*)    Chloride 93 (*)    All other components within normal limits  URINALYSIS, ROUTINE W REFLEX MICROSCOPIC - Abnormal; Notable for the following components:   Hgb urine dipstick TRACE (*)    All other components within normal limits  HEPATIC FUNCTION PANEL - Abnormal; Notable for the following components:   Total Protein 6.4 (*)    All other components within normal limits  URINALYSIS, MICROSCOPIC (REFLEX) - Abnormal; Notable for the following components:   Bacteria, UA FEW (*)    All other components within normal limits  CBC  MAGNESIUM  TROPONIN I  BRAIN NATRIURETIC PEPTIDE  CBG MONITORING, ED   ____________________________________________  EKG   EKG Interpretation  Date/Time:  Saturday May 29 2018 15:13:14 EDT Ventricular Rate:  80 PR Interval:    QRS Duration: 95 QT Interval:  363 QTC Calculation: 414 R Axis:   75 Text Interpretation:  Sinus rhythm Multiple ventricular premature complexes Prolonged PR interval Probable left atrial enlargement Baseline wander in lead(s) II III aVF No significant change since last tracing june 2019 Confirmed by Merrily Pew [id number] on 05/29/2018 5:27:35 PM        ____________________________________________  RADIOLOGY  Ct Head Wo Contrast  Result Date: 05/29/2018 CLINICAL DATA:  History of stroke.  Lethargy.  Recent fall. EXAM: CT HEAD WITHOUT CONTRAST TECHNIQUE: Contiguous axial images were obtained from the base of the skull through the vertex without intravenous contrast. COMPARISON:  MRI 04/27/2017 FINDINGS: Brain: Generalized atrophy. Mild chronic small-vessel ischemic change of the white matter. No sign of recent infarction, mass lesion, hemorrhage, hydrocephalus or extra-axial collection. Vascular: There is atherosclerotic calcification of the major vessels at the base of the brain. Skull: Negative Sinuses/Orbits: Clear/normal Other: None IMPRESSION: No acute finding by CT. Atrophy and mild chronic small-vessel change of the white matter. Electronically Signed   By: Nelson Chimes M.D.   On: 05/29/2018 18:30   Ct Angio Chest Pe W And/or Wo Contrast  Result Date: 05/29/2018 CLINICAL DATA:  84 y/o  F; 45 weeks of lethargy and weakness. EXAM: CT ANGIOGRAPHY CHEST CT ABDOMEN AND PELVIS WITH CONTRAST TECHNIQUE: Multidetector CT imaging of the chest was performed using the standard protocol during bolus administration of intravenous contrast.  Multiplanar CT image reconstructions and MIPs were obtained to evaluate the vascular anatomy. Multidetector CT imaging of the abdomen and pelvis was performed using the standard protocol during bolus administration of intravenous contrast. CONTRAST:  170mL ISOVUE-370 IOPAMIDOL (ISOVUE-370) INJECTION 76% COMPARISON:  03/14/2018 CT angiogram chest. 10/17/2017 and 11/28/2005 CT abdomen and pelvis. FINDINGS: CTA CHEST FINDINGS Cardiovascular: Satisfactory opacification of the pulmonary arteries to the segmental level. No evidence of pulmonary embolism. Stable mild cardiomegaly. No pericardial effusion. Mild calcific atherosclerosis of the aorta. Normal caliber thoracic aorta and main pulmonary artery. Mediastinum/Nodes:  Multiple thyroid nodules measuring up to 9 mm. No mediastinal adenopathy. Large hiatal hernia containing the stomach. Lungs/Pleura: Multiple clustered nodules in a tree-in-bud pattern compatible with bronchiolitis are mildly improved from the prior CT angiogram of the chest. There several additional scattered discrete nodules measuring up to 5 mm that are stable. Trace left pleural effusion. No pneumothorax. Musculoskeletal: Severe S-shaped scoliosis of the spine. Bones are demineralized. No acute fracture identified. Review of the MIP images confirms the above findings. CT ABDOMEN and PELVIS FINDINGS Hepatobiliary: Stable left lobe of liver cysts measuring up to 13 mm. No additional focal liver lesion. Normal gallbladder. No intra or extrahepatic biliary ductal dilatation. Pancreas: Unremarkable. No pancreatic ductal dilatation or surrounding inflammatory changes. The tail of pancreas is partially herniated into the hiatal hernia. Spleen: Normal in size. Small calcified granulomata. No additional focal lesion. Adrenals/Urinary Tract: Stable 23 mm right kidney interpolar cyst. Stable 5 mm nodule is increased density along the anterior margin of right kidney (series 13 image 22) from 2007, likely hemorrhagic cyst. No additional focal kidney lesion. No urinary stone disease or hydronephrosis. Mild distention of the bladder. Stomach/Bowel: Stomach is within normal limits. Appendix not identified, no pericecal inflammation. No evidence of bowel wall thickening, distention, or inflammatory changes. Vascular/Lymphatic: Aortic atherosclerosis. No enlarged abdominal or pelvic lymph nodes. Reproductive: Status post hysterectomy. No adnexal masses. Other: No abdominal wall hernia or abnormality. No abdominopelvic ascites. Musculoskeletal: Severe S-shaped scoliosis of the spine. L5-S1 grade 1 anterolisthesis. Multilevel discogenic degenerative changes. Review of the MIP images confirms the above findings. IMPRESSION: CTA  chest: 1. No pulmonary embolus identified. 2. Interval improvement of bronchiolitis with mild residual. No new consolidation. 3. Stable scattered discrete pulmonary nodules measuring up to 5 mm. No follow-up needed if patient is low-risk (and has no known or suspected primary neoplasm). Non-contrast chest CT can be considered in 12 months if patient is high-risk. This recommendation follows the consensus statement: Guidelines for Management of Incidental Pulmonary Nodules Detected on CT Images: From the Fleischner Society 2017; Radiology 2017; 284:228-243. 4. Stable cardiomegaly and aortic atherosclerosis. 5. Large hiatal hernia containing stomach and pancreatic tail. CT abdomen pelvis: 1. No acute process identified. 2. Stable liver and kidney cysts. 3. Severe spine scoliosis. Electronically Signed   By: Kristine Garbe M.D.   On: 05/29/2018 18:44   Ct Abdomen Pelvis W Contrast  Result Date: 05/29/2018 CLINICAL DATA:  84 y/o  F; 45 weeks of lethargy and weakness. EXAM: CT ANGIOGRAPHY CHEST CT ABDOMEN AND PELVIS WITH CONTRAST TECHNIQUE: Multidetector CT imaging of the chest was performed using the standard protocol during bolus administration of intravenous contrast. Multiplanar CT image reconstructions and MIPs were obtained to evaluate the vascular anatomy. Multidetector CT imaging of the abdomen and pelvis was performed using the standard protocol during bolus administration of intravenous contrast. CONTRAST:  174mL ISOVUE-370 IOPAMIDOL (ISOVUE-370) INJECTION 76% COMPARISON:  03/14/2018 CT angiogram chest. 10/17/2017 and 11/28/2005 CT abdomen and pelvis.  FINDINGS: CTA CHEST FINDINGS Cardiovascular: Satisfactory opacification of the pulmonary arteries to the segmental level. No evidence of pulmonary embolism. Stable mild cardiomegaly. No pericardial effusion. Mild calcific atherosclerosis of the aorta. Normal caliber thoracic aorta and main pulmonary artery. Mediastinum/Nodes: Multiple thyroid nodules  measuring up to 9 mm. No mediastinal adenopathy. Large hiatal hernia containing the stomach. Lungs/Pleura: Multiple clustered nodules in a tree-in-bud pattern compatible with bronchiolitis are mildly improved from the prior CT angiogram of the chest. There several additional scattered discrete nodules measuring up to 5 mm that are stable. Trace left pleural effusion. No pneumothorax. Musculoskeletal: Severe S-shaped scoliosis of the spine. Bones are demineralized. No acute fracture identified. Review of the MIP images confirms the above findings. CT ABDOMEN and PELVIS FINDINGS Hepatobiliary: Stable left lobe of liver cysts measuring up to 13 mm. No additional focal liver lesion. Normal gallbladder. No intra or extrahepatic biliary ductal dilatation. Pancreas: Unremarkable. No pancreatic ductal dilatation or surrounding inflammatory changes. The tail of pancreas is partially herniated into the hiatal hernia. Spleen: Normal in size. Small calcified granulomata. No additional focal lesion. Adrenals/Urinary Tract: Stable 23 mm right kidney interpolar cyst. Stable 5 mm nodule is increased density along the anterior margin of right kidney (series 13 image 22) from 2007, likely hemorrhagic cyst. No additional focal kidney lesion. No urinary stone disease or hydronephrosis. Mild distention of the bladder. Stomach/Bowel: Stomach is within normal limits. Appendix not identified, no pericecal inflammation. No evidence of bowel wall thickening, distention, or inflammatory changes. Vascular/Lymphatic: Aortic atherosclerosis. No enlarged abdominal or pelvic lymph nodes. Reproductive: Status post hysterectomy. No adnexal masses. Other: No abdominal wall hernia or abnormality. No abdominopelvic ascites. Musculoskeletal: Severe S-shaped scoliosis of the spine. L5-S1 grade 1 anterolisthesis. Multilevel discogenic degenerative changes. Review of the MIP images confirms the above findings. IMPRESSION: CTA chest: 1. No pulmonary embolus  identified. 2. Interval improvement of bronchiolitis with mild residual. No new consolidation. 3. Stable scattered discrete pulmonary nodules measuring up to 5 mm. No follow-up needed if patient is low-risk (and has no known or suspected primary neoplasm). Non-contrast chest CT can be considered in 12 months if patient is high-risk. This recommendation follows the consensus statement: Guidelines for Management of Incidental Pulmonary Nodules Detected on CT Images: From the Fleischner Society 2017; Radiology 2017; 284:228-243. 4. Stable cardiomegaly and aortic atherosclerosis. 5. Large hiatal hernia containing stomach and pancreatic tail. CT abdomen pelvis: 1. No acute process identified. 2. Stable liver and kidney cysts. 3. Severe spine scoliosis. Electronically Signed   By: Kristine Garbe M.D.   On: 05/29/2018 18:44    ____________________________________________   PROCEDURES  Procedure(s) performed:   Procedures   ____________________________________________   INITIAL IMPRESSION / ASSESSMENT AND PLAN / ED COURSE  Etiologies that are possible for this patient with generalized weakness.  Weight loss and obvious dehydration we will give some fluids and evaluate for any electrolyte abnormalities or cardiac abnormalities.  Also suspect possible infection versus neoplasm.  With the fall and hit her head spine needs a CT of her head to make sure she does have a chronic subdural.  If all this is normal patient is stable for discharge continue following up with her primary doctor.  The dehydrated fluids given.  No immediate life-threatening issues identified as the cause of her weakness this time.  Suspect may be related to the large hiatal hernia however I doubt she is a surgical candidate with her age and there is no acute indication for surgical evaluation.  Stable for discharge with  PCP follow-up     Pertinent labs & imaging results that were available during my care of the patient  were reviewed by me and considered in my medical decision making (see chart for details).  ____________________________________________  FINAL CLINICAL IMPRESSION(S) / ED DIAGNOSES  Final diagnoses:  Weakness  Dehydration     MEDICATIONS GIVEN DURING THIS VISIT:  Medications  lactated ringers bolus 1,000 mL (0 mLs Intravenous Stopped 05/29/18 1726)  iopamidol (ISOVUE-370) 76 % injection 100 mL (100 mLs Intravenous Contrast Given 05/29/18 1744)     NEW OUTPATIENT MEDICATIONS STARTED DURING THIS VISIT:  Current Discharge Medication List      Note:  This note was prepared with assistance of Dragon voice recognition software. Occasional wrong-word or sound-a-like substitutions may have occurred due to the inherent limitations of voice recognition software.   Merrily Pew, MD 05/29/18 [id number]

## 2018-05-29 NOTE — ED Notes (Signed)
ED Provider at bedside. 

## 2018-06-03 ENCOUNTER — Inpatient Hospital Stay: Payer: Medicare Other | Attending: Internal Medicine

## 2018-06-03 ENCOUNTER — Telehealth: Payer: Self-pay | Admitting: Oncology

## 2018-06-03 ENCOUNTER — Other Ambulatory Visit: Payer: Self-pay

## 2018-06-03 ENCOUNTER — Inpatient Hospital Stay (HOSPITAL_BASED_OUTPATIENT_CLINIC_OR_DEPARTMENT_OTHER): Payer: Medicare Other | Admitting: Oncology

## 2018-06-03 ENCOUNTER — Encounter: Payer: Self-pay | Admitting: Oncology

## 2018-06-03 VITALS — BP 160/84 | HR 79 | Temp 97.8°F | Resp 14 | Ht 59.0 in | Wt 87.1 lb

## 2018-06-03 DIAGNOSIS — K922 Gastrointestinal hemorrhage, unspecified: Secondary | ICD-10-CM | POA: Insufficient documentation

## 2018-06-03 DIAGNOSIS — Z79899 Other long term (current) drug therapy: Secondary | ICD-10-CM | POA: Insufficient documentation

## 2018-06-03 DIAGNOSIS — Z90710 Acquired absence of both cervix and uterus: Secondary | ICD-10-CM | POA: Diagnosis not present

## 2018-06-03 DIAGNOSIS — Z8673 Personal history of transient ischemic attack (TIA), and cerebral infarction without residual deficits: Secondary | ICD-10-CM | POA: Insufficient documentation

## 2018-06-03 DIAGNOSIS — D500 Iron deficiency anemia secondary to blood loss (chronic): Secondary | ICD-10-CM | POA: Diagnosis present

## 2018-06-03 DIAGNOSIS — D62 Acute posthemorrhagic anemia: Secondary | ICD-10-CM

## 2018-06-03 DIAGNOSIS — Z7982 Long term (current) use of aspirin: Secondary | ICD-10-CM | POA: Diagnosis not present

## 2018-06-03 DIAGNOSIS — D649 Anemia, unspecified: Secondary | ICD-10-CM

## 2018-06-03 LAB — IRON AND TIBC
Iron: 61 ug/dL (ref 41–142)
Saturation Ratios: 16 % — ABNORMAL LOW (ref 21–57)
TIBC: 378 ug/dL (ref 236–444)
UIBC: 316 ug/dL

## 2018-06-03 LAB — CBC WITH DIFFERENTIAL (CANCER CENTER ONLY)
BASOS PCT: 1 %
Basophils Absolute: 0 10*3/uL (ref 0.0–0.1)
Eosinophils Absolute: 0.2 10*3/uL (ref 0.0–0.5)
Eosinophils Relative: 4 %
HCT: 40.5 % (ref 34.8–46.6)
Hemoglobin: 13.3 g/dL (ref 11.6–15.9)
Lymphocytes Relative: 22 %
Lymphs Abs: 1 10*3/uL (ref 0.9–3.3)
MCH: 30.8 pg (ref 25.1–34.0)
MCHC: 32.9 g/dL (ref 31.5–36.0)
MCV: 93.9 fL (ref 79.5–101.0)
Monocytes Absolute: 0.5 10*3/uL (ref 0.1–0.9)
Monocytes Relative: 11 %
NEUTROS PCT: 62 %
Neutro Abs: 2.9 10*3/uL (ref 1.5–6.5)
Platelet Count: 224 10*3/uL (ref 145–400)
RBC: 4.31 MIL/uL (ref 3.70–5.45)
RDW: 15.8 % — AB (ref 11.2–14.5)
WBC Count: 4.6 10*3/uL (ref 3.9–10.3)

## 2018-06-03 LAB — FERRITIN: FERRITIN: 39 ng/mL (ref 11–307)

## 2018-06-03 NOTE — Telephone Encounter (Signed)
Scheduled appt per 9/19 los - lab and f.u in 3-4 months - sent reminder letter in the mail.

## 2018-06-03 NOTE — Progress Notes (Signed)
[place] [address]  DIAGNOSIS: History of iron deficiency anemia secondary to gastrointestinal blood loss and and tolerability to oral iron tablets.  PRIOR THERAPY: The patient has received IV iron in the past.  CURRENT THERAPY: Observation  INTERVAL HISTORY: Jeanne Cruz 84 y.o. female returns for routine follow-up visit accompanied by her caregiver.  The patient reports that she been having a lot of fatigue.  She denies fevers and chills.  Denies chest pain, shortness of breath, cough, hemoptysis.  Denies nausea, vomiting, constipation, diarrhea.  Denies recent weight loss or night sweats.  She denies bleeding.  The patient is here for evaluation and repeat lab work.  MEDICAL HISTORY: Past Medical History:  Diagnosis Date  . Anemia   . Anxiety   . Arthritis   . Blood transfusion without reported diagnosis   . Degenerative disc disease, cervical   . GI bleeding   . Iron deficiency anemia   . Neuropathy   . Postherpetic neuralgia   . Shingles   . Stroke [facility])     ALLERGIES:  is allergic to ampicillin; codeine; and penicillins.  MEDICATIONS:  Current Outpatient Medications  Medication Sig Dispense Refill  . albuterol (PROVENTIL HFA;VENTOLIN HFA) 108 (90 Base) MCG/ACT inhaler Inhale 1-2 puffs into the lungs every 6 (six) hours as needed for wheezing or shortness of breath.     . ALPRAZolam (XANAX) 0.5 MG tablet Take 1 tablet (0.5 mg total) by mouth 2 (two) times daily as needed for anxiety or sleep. 5 tablet 0  . aspirin EC 81 MG tablet Take 1 tablet by mouth daily.  2  . B Complex Vitamins (VITAMIN B COMPLEX IJ) Inject as directed every 30 (thirty) days.    . bimatoprost (LUMIGAN) 0.03 % ophthalmic solution Place 1 drop into both eyes at bedtime.     . dicyclomine (BENTYL) 10 MG capsule Take 1 capsule by mouth 2 (two) times daily as needed.  2  . dorzolamide-timolol (COSOPT)  22.3-6.8 MG/ML ophthalmic solution Place 1 drop into both eyes 2 (two) times daily.     Marland Kitchen gabapentin (NEURONTIN) 100 MG capsule Take 100 mg by mouth 3 (three) times daily.     . Multiple Vitamin (MULTIVITAMIN WITH MINERALS) TABS tablet Take 1 tablet by mouth daily.    Marland Kitchen nystatin (NYSTATIN) powder Apply 2 g topically 2 (two) times daily as needed (for irritation).    . pantoprazole (PROTONIX) 40 MG tablet Take 1 tablet (40 mg total) by mouth 2 (two) times daily. 60 tablet 0  . polyethylene glycol (MIRALAX / GLYCOLAX) packet Take 17 g by mouth daily.     . traMADol (ULTRAM) 50 MG tablet Take 1 tablet (50 mg total) by mouth every 6 (six) hours as needed for moderate pain. 10 tablet 0  . Vitamin D, Ergocalciferol, (DRISDOL) 50000 units CAPS capsule Take 50,000 Units by mouth every 14 (fourteen) days.    Marland Kitchen atorvastatin (LIPITOR) 20 MG tablet Take 1 tablet (20 mg total) by mouth daily at 6 PM. (Patient not taking: Reported on 10/27/2017) 30 tablet 0  . mirtazapine (REMERON) 15 MG tablet Take 1 tablet by mouth daily.    . potassium chloride SA (K-DUR,KLOR-CON) 20 MEQ tablet Take 1 tablet (20 mEq total) by mouth 2 (two) times daily for 2 days. (Patient not taking: Reported on 06/03/2018) 4 tablet 0   No current facility-administered medications for this visit.  SURGICAL HISTORY:  Past Surgical History:  Procedure Laterality Date  . ABDOMINAL HYSTERECTOMY    . APPENDECTOMY    . BACK SURGERY    . ESOPHAGOGASTRODUODENOSCOPY N/A 04/17/2017   Procedure: ESOPHAGOGASTRODUODENOSCOPY (EGD);  Surgeon: Carol Ada, MD;  Location: [facility];  Service: Endoscopy;  Laterality: N/A;  . TONSILLECTOMY      REVIEW OF SYSTEMS:   Review of Systems  Constitutional: Negative for appetite change, chills, fever and unexpected weight change.  Positive for fatigue. HENT:   Negative for mouth sores, nosebleeds, sore throat and trouble swallowing.   Eyes: Negative for eye problems and icterus.  Respiratory: Negative  for cough, hemoptysis, shortness of breath and wheezing.   Cardiovascular: Negative for chest pain and leg swelling.  Gastrointestinal: Negative for abdominal pain, constipation, diarrhea, nausea and vomiting.  Genitourinary: Negative for bladder incontinence, difficulty urinating, dysuria, frequency and hematuria.   Musculoskeletal: Negative for back pain, gait problem, neck pain and neck stiffness.  Skin: Negative for itching and rash.  Neurological: Negative for dizziness, extremity weakness, gait problem, headaches, light-headedness and seizures.  Hematological: Negative for adenopathy. Does not bruise/bleed easily.  Psychiatric/Behavioral: Negative for confusion, depression and sleep disturbance. The patient is not nervous/anxious.     PHYSICAL EXAMINATION:  Blood pressure (!) 160/84, pulse 79, temperature 97.8 F (36.6 C), temperature source Oral, resp. rate 14, height 4\' 11"  (1.499 m), weight 87 lb 1.6 oz (39.5 kg), SpO2 94 %.  ECOG PERFORMANCE STATUS: 1 - Symptomatic but completely ambulatory  Physical Exam  Constitutional: Oriented to person, place, and time. No distress.  HENT:  Head: Normocephalic and atraumatic.  Mouth/Throat: Oropharynx is clear and moist. No oropharyngeal exudate.  Eyes: Conjunctivae are normal. Right eye exhibits no discharge. Left eye exhibits no discharge. No scleral icterus.  Neck: Normal range of motion. Neck supple.  Cardiovascular: Normal rate, regular rhythm, normal heart sounds and intact distal pulses.   Pulmonary/Chest: Effort normal and breath sounds normal. No respiratory distress. No wheezes. No rales.  Abdominal: Soft. Bowel sounds are normal. Exhibits no distension and no mass. There is no tenderness.  Musculoskeletal: Normal range of motion. Exhibits no edema.  Lymphadenopathy:    No cervical adenopathy.  Neurological: Alert and oriented to person, place, and time. Exhibits normal muscle tone. Gait normal. Coordination normal.  Skin: Skin  is warm and dry. No rash noted. Not diaphoretic. No erythema. No pallor.  Psychiatric: Mood, memory and judgment normal.  Vitals reviewed.  LABORATORY DATA: Lab Results  Component Value Date   WBC 4.6 06/03/2018   HGB 13.3 06/03/2018   HCT 40.5 06/03/2018   MCV 93.9 06/03/2018   PLT 224 06/03/2018      Chemistry      Component Value Date/Time   NA 131 (L) 05/29/2018 1520   K 3.9 05/29/2018 1520   CL 93 (L) 05/29/2018 1520   CO2 32 05/29/2018 1520   BUN 16 05/29/2018 1520   CREATININE 0.59 05/29/2018 1520      Component Value Date/Time   CALCIUM 9.0 05/29/2018 1520   ALKPHOS 72 05/29/2018 1520   AST 25 05/29/2018 1520   ALT 23 05/29/2018 1520   BILITOT 0.6 05/29/2018 1520       RADIOGRAPHIC STUDIES:  Ct Head Wo Contrast  Result Date: 05/29/2018 CLINICAL DATA:  History of stroke.  Lethargy.  Recent fall. EXAM: CT HEAD WITHOUT CONTRAST TECHNIQUE: Contiguous axial images were obtained from the base of the skull through the vertex without intravenous contrast. COMPARISON:  MRI 04/27/2017  FINDINGS: Brain: Generalized atrophy. Mild chronic small-vessel ischemic change of the white matter. No sign of recent infarction, mass lesion, hemorrhage, hydrocephalus or extra-axial collection. Vascular: There is atherosclerotic calcification of the major vessels at the base of the brain. Skull: Negative Sinuses/Orbits: Clear/normal Other: None IMPRESSION: No acute finding by CT. Atrophy and mild chronic small-vessel change of the white matter. Electronically Signed   By: Nelson Chimes M.D.   On: 05/29/2018 18:30   Ct Angio Chest Pe W And/or Wo Contrast  Result Date: 05/29/2018 CLINICAL DATA:  84 y/o  F; 45 weeks of lethargy and weakness. EXAM: CT ANGIOGRAPHY CHEST CT ABDOMEN AND PELVIS WITH CONTRAST TECHNIQUE: Multidetector CT imaging of the chest was performed using the standard protocol during bolus administration of intravenous contrast. Multiplanar CT image reconstructions and MIPs were  obtained to evaluate the vascular anatomy. Multidetector CT imaging of the abdomen and pelvis was performed using the standard protocol during bolus administration of intravenous contrast. CONTRAST:  196mL ISOVUE-370 IOPAMIDOL (ISOVUE-370) INJECTION 76% COMPARISON:  03/14/2018 CT angiogram chest. 10/17/2017 and 11/28/2005 CT abdomen and pelvis. FINDINGS: CTA CHEST FINDINGS Cardiovascular: Satisfactory opacification of the pulmonary arteries to the segmental level. No evidence of pulmonary embolism. Stable mild cardiomegaly. No pericardial effusion. Mild calcific atherosclerosis of the aorta. Normal caliber thoracic aorta and main pulmonary artery. Mediastinum/Nodes: Multiple thyroid nodules measuring up to 9 mm. No mediastinal adenopathy. Large hiatal hernia containing the stomach. Lungs/Pleura: Multiple clustered nodules in a tree-in-bud pattern compatible with bronchiolitis are mildly improved from the prior CT angiogram of the chest. There several additional scattered discrete nodules measuring up to 5 mm that are stable. Trace left pleural effusion. No pneumothorax. Musculoskeletal: Severe S-shaped scoliosis of the spine. Bones are demineralized. No acute fracture identified. Review of the MIP images confirms the above findings. CT ABDOMEN and PELVIS FINDINGS Hepatobiliary: Stable left lobe of liver cysts measuring up to 13 mm. No additional focal liver lesion. Normal gallbladder. No intra or extrahepatic biliary ductal dilatation. Pancreas: Unremarkable. No pancreatic ductal dilatation or surrounding inflammatory changes. The tail of pancreas is partially herniated into the hiatal hernia. Spleen: Normal in size. Small calcified granulomata. No additional focal lesion. Adrenals/Urinary Tract: Stable 23 mm right kidney interpolar cyst. Stable 5 mm nodule is increased density along the anterior margin of right kidney (series 13 image 22) from 2007, likely hemorrhagic cyst. No additional focal kidney lesion. No  urinary stone disease or hydronephrosis. Mild distention of the bladder. Stomach/Bowel: Stomach is within normal limits. Appendix not identified, no pericecal inflammation. No evidence of bowel wall thickening, distention, or inflammatory changes. Vascular/Lymphatic: Aortic atherosclerosis. No enlarged abdominal or pelvic lymph nodes. Reproductive: Status post hysterectomy. No adnexal masses. Other: No abdominal wall hernia or abnormality. No abdominopelvic ascites. Musculoskeletal: Severe S-shaped scoliosis of the spine. L5-S1 grade 1 anterolisthesis. Multilevel discogenic degenerative changes. Review of the MIP images confirms the above findings. IMPRESSION: CTA chest: 1. No pulmonary embolus identified. 2. Interval improvement of bronchiolitis with mild residual. No new consolidation. 3. Stable scattered discrete pulmonary nodules measuring up to 5 mm. No follow-up needed if patient is low-risk (and has no known or suspected primary neoplasm). Non-contrast chest CT can be considered in 12 months if patient is high-risk. This recommendation follows the consensus statement: Guidelines for Management of Incidental Pulmonary Nodules Detected on CT Images: From the Fleischner Society 2017; Radiology 2017; 284:228-243. 4. Stable cardiomegaly and aortic atherosclerosis. 5. Large hiatal hernia containing stomach and pancreatic tail. CT abdomen pelvis: 1. No acute process  identified. 2. Stable liver and kidney cysts. 3. Severe spine scoliosis. Electronically Signed   By: Kristine Garbe M.D.   On: 05/29/2018 18:44   Ct Abdomen Pelvis W Contrast  Result Date: 05/29/2018 CLINICAL DATA:  84 y/o  F; 45 weeks of lethargy and weakness. EXAM: CT ANGIOGRAPHY CHEST CT ABDOMEN AND PELVIS WITH CONTRAST TECHNIQUE: Multidetector CT imaging of the chest was performed using the standard protocol during bolus administration of intravenous contrast. Multiplanar CT image reconstructions and MIPs were obtained to evaluate the  vascular anatomy. Multidetector CT imaging of the abdomen and pelvis was performed using the standard protocol during bolus administration of intravenous contrast. CONTRAST:  121mL ISOVUE-370 IOPAMIDOL (ISOVUE-370) INJECTION 76% COMPARISON:  03/14/2018 CT angiogram chest. 10/17/2017 and 11/28/2005 CT abdomen and pelvis. FINDINGS: CTA CHEST FINDINGS Cardiovascular: Satisfactory opacification of the pulmonary arteries to the segmental level. No evidence of pulmonary embolism. Stable mild cardiomegaly. No pericardial effusion. Mild calcific atherosclerosis of the aorta. Normal caliber thoracic aorta and main pulmonary artery. Mediastinum/Nodes: Multiple thyroid nodules measuring up to 9 mm. No mediastinal adenopathy. Large hiatal hernia containing the stomach. Lungs/Pleura: Multiple clustered nodules in a tree-in-bud pattern compatible with bronchiolitis are mildly improved from the prior CT angiogram of the chest. There several additional scattered discrete nodules measuring up to 5 mm that are stable. Trace left pleural effusion. No pneumothorax. Musculoskeletal: Severe S-shaped scoliosis of the spine. Bones are demineralized. No acute fracture identified. Review of the MIP images confirms the above findings. CT ABDOMEN and PELVIS FINDINGS Hepatobiliary: Stable left lobe of liver cysts measuring up to 13 mm. No additional focal liver lesion. Normal gallbladder. No intra or extrahepatic biliary ductal dilatation. Pancreas: Unremarkable. No pancreatic ductal dilatation or surrounding inflammatory changes. The tail of pancreas is partially herniated into the hiatal hernia. Spleen: Normal in size. Small calcified granulomata. No additional focal lesion. Adrenals/Urinary Tract: Stable 23 mm right kidney interpolar cyst. Stable 5 mm nodule is increased density along the anterior margin of right kidney (series 13 image 22) from 2007, likely hemorrhagic cyst. No additional focal kidney lesion. No urinary stone disease or  hydronephrosis. Mild distention of the bladder. Stomach/Bowel: Stomach is within normal limits. Appendix not identified, no pericecal inflammation. No evidence of bowel wall thickening, distention, or inflammatory changes. Vascular/Lymphatic: Aortic atherosclerosis. No enlarged abdominal or pelvic lymph nodes. Reproductive: Status post hysterectomy. No adnexal masses. Other: No abdominal wall hernia or abnormality. No abdominopelvic ascites. Musculoskeletal: Severe S-shaped scoliosis of the spine. L5-S1 grade 1 anterolisthesis. Multilevel discogenic degenerative changes. Review of the MIP images confirms the above findings. IMPRESSION: CTA chest: 1. No pulmonary embolus identified. 2. Interval improvement of bronchiolitis with mild residual. No new consolidation. 3. Stable scattered discrete pulmonary nodules measuring up to 5 mm. No follow-up needed if patient is low-risk (and has no known or suspected primary neoplasm). Non-contrast chest CT can be considered in 12 months if patient is high-risk. This recommendation follows the consensus statement: Guidelines for Management of Incidental Pulmonary Nodules Detected on CT Images: From the Fleischner Society 2017; Radiology 2017; 284:228-243. 4. Stable cardiomegaly and aortic atherosclerosis. 5. Large hiatal hernia containing stomach and pancreatic tail. CT abdomen pelvis: 1. No acute process identified. 2. Stable liver and kidney cysts. 3. Severe spine scoliosis. Electronically Signed   By: Kristine Garbe M.D.   On: 05/29/2018 18:44     ASSESSMENT/PLAN:  Normocytic anemia This is a very pleasant 84 year old white female with history of iron deficiency anemia secondary to gastrointestinal blood loss  and and tolerability to oral iron tablets.  She has received IV iron in the past. CBC from today was reviewed and her hemoglobin and hematocrit are normal.  Iron studies and ferritin are pending.  Recommend continued observation.  We will follow-up on the  ferritin and iron studies and if they are low, consider IV iron.  Otherwise, we will see her back in 3 months for evaluation and repeat lab work.  The patient voices understanding of current disease status and treatment options and is in agreement with the current care plan.  All questions were answered. The patient knows to call the clinic with any problems, questions or concerns. We can certainly see the patient much sooner if necessary.   Orders Placed This Encounter  Procedures  . CBC with Differential (Cancer Center Only)    Standing Status:   Future    Standing Expiration Date:   06/04/2019  . Ferritin    Standing Status:   Future    Standing Expiration Date:   06/04/2019  . Iron and TIBC    Standing Status:   Future    Standing Expiration Date:   06/04/2019     Mikey Bussing, DNP, AGPCNP-BC, AOCNP 06/03/18

## 2018-06-04 NOTE — Assessment & Plan Note (Signed)
This is a very pleasant 84 year old white female with history of iron deficiency anemia secondary to gastrointestinal blood loss and and tolerability to oral iron tablets.  She has received IV iron in the past. CBC from today was reviewed and her hemoglobin and hematocrit are normal.  Iron studies and ferritin are pending.  Recommend continued observation.  We will follow-up on the ferritin and iron studies and if they are low, consider IV iron.  Otherwise, we will see her back in 3 months for evaluation and repeat lab work.  The patient voices understanding of current disease status and treatment options and is in agreement with the current care plan.  All questions were answered. The patient knows to call the clinic with any problems, questions or concerns. We can certainly see the patient much sooner if necessary.

## 2018-06-24 ENCOUNTER — Ambulatory Visit (INDEPENDENT_AMBULATORY_CARE_PROVIDER_SITE_OTHER): Payer: Medicare Other | Admitting: Family Medicine

## 2018-06-24 ENCOUNTER — Encounter (INDEPENDENT_AMBULATORY_CARE_PROVIDER_SITE_OTHER): Payer: Self-pay | Admitting: Family Medicine

## 2018-06-24 ENCOUNTER — Ambulatory Visit (INDEPENDENT_AMBULATORY_CARE_PROVIDER_SITE_OTHER): Payer: Medicare Other

## 2018-06-24 DIAGNOSIS — M545 Low back pain, unspecified: Secondary | ICD-10-CM

## 2018-06-24 DIAGNOSIS — M419 Scoliosis, unspecified: Secondary | ICD-10-CM | POA: Diagnosis not present

## 2018-06-24 DIAGNOSIS — G8929 Other chronic pain: Secondary | ICD-10-CM

## 2018-06-24 NOTE — Progress Notes (Signed)
Office Visit Note   Patient: Jeanne Cruz           Date of Birth: 1928/01/19           MRN: [id number] Visit Date: 06/24/2018 Requested by: [facility] [address] [place], [place] [address] PCP: [facility]  Subjective: Chief Complaint  Patient presents with  . Back pain/ ? brace - Ref. by Dr. Jola Baptist    HPI: She is an 84 year old seen at the request of Dr. Jimmye Norman for back pain.  Long-standing problems with her back.  She has severe scoliosis and osteoporosis and has lost 3 inches from her maximum adult height.  She has had difficulty eating, she has to stand up to eat her food.  She has troubles breathing at times.  She uses a walker for support with ambulation.  She wonders whether a back brace might help her.                Objective: Vital Signs: There were no vitals taken for this visit.  Physical Exam:  She has thoracic kyphosis.  No tenderness along her cervical, thoracic or lumbar spinous processes today.  Palpating her lateral rib cage, it is medial and below the level of the left iliac crest.  On the right side, her lowest rib touches the iliac crest.  Lower extremity strength and reflexes are normal.  Imaging: Scoliosis x-rays: She has severe scoliosis with convex to the right.  Diffuse degenerative change throughout the spine.  She has a probable hiatal hernia.  Assessment & Plan: 1.  Chronic spine pain with severe scoliosis and degenerative change -I will ask 1 of our surgeons about brace options.  Otherwise she will continue with chiropractic management.   Follow-Up Instructions: No follow-ups on file.       Procedures: None today.   PMFS History: Patient Active Problem List   Diagnosis Date Noted  . Protein-calorie malnutrition, severe 03/17/2018  . Elevated troponin 03/15/2018  . Weakness generalized 03/15/2018  . Anorexia 03/15/2018  . CAP (community acquired pneumonia) 03/14/2018  . Hypotension 03/14/2018    . Erosive gastropathy 04/28/2017  . Ischemic stroke ([place]) 04/27/2017  . Hyponatremia 04/27/2017  . Normocytic anemia   . Acute blood loss anemia 04/17/2017  . GI bleed 04/16/2017  . GLAUCOMA 04/17/2008  . COLONIC POLYPS 04/12/2008  . CONSTIPATION 04/12/2008  . BRONCHITIS, RECURRENT 09/01/2007  . NECK AND BACK PAIN 09/01/2007  . Anxiety state 06/28/2007  . HIATAL HERNIA 06/28/2007  . DEGENERATIVE JOINT DISEASE 06/28/2007  . SCOLIOSIS 06/28/2007  . ANEMIA, HX OF 06/28/2007   Past Medical History:  Diagnosis Date  . Anemia   . Anxiety   . Arthritis   . Blood transfusion without reported diagnosis   . Degenerative disc disease, cervical   . GI bleeding   . Iron deficiency anemia   . Neuropathy   . Postherpetic neuralgia   . Shingles   . Stroke [facility])     Family History  Problem Relation Age of Onset  . Colon cancer Sister   . Heart attack Sister     Past Surgical History:  Procedure Laterality Date  . ABDOMINAL HYSTERECTOMY    . APPENDECTOMY    . BACK SURGERY    . ESOPHAGOGASTRODUODENOSCOPY N/A 04/17/2017   Procedure: ESOPHAGOGASTRODUODENOSCOPY (EGD);  Surgeon: Carol Ada, MD;  Location: [facility];  Service: Endoscopy;  Laterality: N/A;  . TONSILLECTOMY     Social History   Occupational History  .  Not on file  Tobacco Use  . Smoking status: Never Smoker  . Smokeless tobacco: Never Used  Substance and Sexual Activity  . Alcohol use: No  . Drug use: No  . Sexual activity: Not Currently

## 2018-06-29 ENCOUNTER — Telehealth (INDEPENDENT_AMBULATORY_CARE_PROVIDER_SITE_OTHER): Payer: Self-pay | Admitting: Family Medicine

## 2018-06-29 NOTE — Telephone Encounter (Signed)
I spoke with patient's daughter regarding back braces.  After discussing with Dr. Louanne Skye, he feels that there is a chance that a brace might negatively affect her pulmonary function.  However, patient is in quite a bit of discomfort related to her back so she would like to try a brace at least temporarily.  If at any point it seems to be making her breathing worse, we will stop it.

## 2018-06-29 NOTE — Telephone Encounter (Signed)
-----   Message from Eunice Blase, MD sent at 06/24/2018 12:01 PM EDT ----- Regarding: RE: Brace options [facility] - [phone] mobile [phone] home    ----- Message ----- From: Eunice Blase, MD Sent: 06/24/2018  11:39 AM EDT To: Eunice Blase, MD Subject: Brace options

## 2018-08-18 ENCOUNTER — Other Ambulatory Visit: Payer: Self-pay | Admitting: Internal Medicine

## 2018-08-18 ENCOUNTER — Encounter: Payer: Self-pay | Admitting: Internal Medicine

## 2018-08-18 ENCOUNTER — Telehealth: Payer: Self-pay | Admitting: *Deleted

## 2018-08-18 ENCOUNTER — Inpatient Hospital Stay (HOSPITAL_BASED_OUTPATIENT_CLINIC_OR_DEPARTMENT_OTHER): Payer: Medicare Other | Admitting: Internal Medicine

## 2018-08-18 ENCOUNTER — Inpatient Hospital Stay: Payer: Medicare Other | Attending: Internal Medicine

## 2018-08-18 VITALS — BP 149/97 | HR 130 | Temp 98.9°F | Resp 20 | Ht 59.0 in | Wt 84.5 lb

## 2018-08-18 DIAGNOSIS — D500 Iron deficiency anemia secondary to blood loss (chronic): Secondary | ICD-10-CM | POA: Diagnosis present

## 2018-08-18 DIAGNOSIS — Z79899 Other long term (current) drug therapy: Secondary | ICD-10-CM | POA: Diagnosis not present

## 2018-08-18 DIAGNOSIS — K921 Melena: Secondary | ICD-10-CM | POA: Diagnosis not present

## 2018-08-18 DIAGNOSIS — Z862 Personal history of diseases of the blood and blood-forming organs and certain disorders involving the immune mechanism: Secondary | ICD-10-CM

## 2018-08-18 DIAGNOSIS — Z7982 Long term (current) use of aspirin: Secondary | ICD-10-CM | POA: Insufficient documentation

## 2018-08-18 DIAGNOSIS — D649 Anemia, unspecified: Secondary | ICD-10-CM

## 2018-08-18 LAB — CBC WITH DIFFERENTIAL (CANCER CENTER ONLY)
Abs Immature Granulocytes: 0.04 10*3/uL (ref 0.00–0.07)
BASOS PCT: 0 %
Basophils Absolute: 0 10*3/uL (ref 0.0–0.1)
Eosinophils Absolute: 0.1 10*3/uL (ref 0.0–0.5)
Eosinophils Relative: 0 %
HCT: 39.6 % (ref 36.0–46.0)
Hemoglobin: 12.8 g/dL (ref 12.0–15.0)
Immature Granulocytes: 0 %
Lymphocytes Relative: 6 %
Lymphs Abs: 0.8 10*3/uL (ref 0.7–4.0)
MCH: 30.6 pg (ref 26.0–34.0)
MCHC: 32.3 g/dL (ref 30.0–36.0)
MCV: 94.7 fL (ref 80.0–100.0)
Monocytes Absolute: 1.2 10*3/uL — ABNORMAL HIGH (ref 0.1–1.0)
Monocytes Relative: 9 %
NEUTROS ABS: 11.1 10*3/uL — AB (ref 1.7–7.7)
Neutrophils Relative %: 85 %
Platelet Count: 233 10*3/uL (ref 150–400)
RBC: 4.18 MIL/uL (ref 3.87–5.11)
RDW: 13.7 % (ref 11.5–15.5)
WBC Count: 13.3 10*3/uL — ABNORMAL HIGH (ref 4.0–10.5)
nRBC: 0 % (ref 0.0–0.2)

## 2018-08-18 LAB — IRON AND TIBC
Iron: 28 ug/dL — ABNORMAL LOW (ref 41–142)
Saturation Ratios: 7 % — ABNORMAL LOW (ref 21–57)
TIBC: 396 ug/dL (ref 236–444)
UIBC: 368 ug/dL (ref 120–384)

## 2018-08-18 LAB — FERRITIN: FERRITIN: 26 ng/mL (ref 11–307)

## 2018-08-18 NOTE — Telephone Encounter (Signed)
Called pt with appt date/time for IV Iron infusion.Pt verbalized understanding,

## 2018-08-18 NOTE — Progress Notes (Signed)
[facility] [phone]   Fax:([phone]) [address], [place] [address]  DIAGNOSIS: History of iron deficiency anemia secondary to gastrointestinal blood loss and and tolerability to oral iron tablets.  PRIOR THERAPY: The patient has received IV iron in the past.  CURRENT THERAPY:  Feraheme infusion 510 mg IV weekly.  First dose August 20, 2018.  INTERVAL HISTORY: Jeanne Cruz 84 y.o. female returns to the clinic today for follow-up visit accompanied by her daughter.  The patient continues to complain of multiple issues including increasing fatigue and weakness as well as acid reflux and hernia causing some pressure and shortness of breath.  The patient denied having any chest pain, cough or hemoptysis.  She has a history of anemia and was evaluated in the past by Dr. Benson Norway.  She had upper endoscopy on April 17, 2017 that showed nonbleeding erosive gastropathy.  She was treated with a proton pump inhibitor at that time.  The patient mentioned that she noticed black tarry stool recently.  She is here today for evaluation and repeat CBC, iron study and ferritin.  MEDICAL HISTORY: Past Medical History:  Diagnosis Date  . Anemia   . Anxiety   . Arthritis   . Blood transfusion without reported diagnosis   . Degenerative disc disease, cervical   . GI bleeding   . Iron deficiency anemia   . Neuropathy   . Postherpetic neuralgia   . Shingles   . Stroke [facility])     ALLERGIES:  is allergic to ampicillin; codeine; and penicillins.  MEDICATIONS:  Current Outpatient Medications  Medication Sig Dispense Refill  . albuterol (PROVENTIL HFA;VENTOLIN HFA) 108 (90 Base) MCG/ACT inhaler Inhale 1-2 puffs into the lungs every 6 (six) hours as needed for wheezing or shortness of breath.     . ALPRAZolam (XANAX) 0.5 MG tablet Take 1 tablet (0.5 mg total) by mouth 2 (two) times daily as needed for anxiety  or sleep. 5 tablet 0  . aspirin EC 81 MG tablet Take 1 tablet by mouth daily.  2  . atorvastatin (LIPITOR) 20 MG tablet Take 1 tablet (20 mg total) by mouth daily at 6 PM. (Patient not taking: Reported on 10/27/2017) 30 tablet 0  . B Complex Vitamins (VITAMIN B COMPLEX IJ) Inject as directed every 30 (thirty) days.    . bimatoprost (LUMIGAN) 0.03 % ophthalmic solution Place 1 drop into both eyes at bedtime.     . COMBIGAN 0.2-0.5 % ophthalmic solution     . dicyclomine (BENTYL) 10 MG capsule Take 1 capsule by mouth 2 (two) times daily as needed.  2  . dorzolamide-timolol (COSOPT) 22.3-6.8 MG/ML ophthalmic solution Place 1 drop into both eyes 2 (two) times daily.     Marland Kitchen gabapentin (NEURONTIN) 100 MG capsule Take 100 mg by mouth 3 (three) times daily.     . meloxicam (MOBIC) 7.5 MG tablet Take 7.5 mg by mouth daily.  0  . mirtazapine (REMERON) 15 MG tablet Take 1 tablet by mouth daily.    . Multiple Vitamin (MULTIVITAMIN WITH MINERALS) TABS tablet Take 1 tablet by mouth daily.    Marland Kitchen nystatin (NYSTATIN) powder Apply 2 g topically 2 (two) times daily as needed (for irritation).    . pantoprazole (PROTONIX) 40 MG tablet Take 1 tablet (40 mg total) by mouth 2 (two) times daily. 60 tablet 0  . polyethylene glycol (MIRALAX / GLYCOLAX) packet Take 17  g by mouth daily.     . potassium chloride SA (K-DUR,KLOR-CON) 20 MEQ tablet Take 1 tablet (20 mEq total) by mouth 2 (two) times daily for 2 days. (Patient not taking: Reported on 06/03/2018) 4 tablet 0  . traMADol (ULTRAM) 50 MG tablet Take 1 tablet (50 mg total) by mouth every 6 (six) hours as needed for moderate pain. 10 tablet 0  . Vitamin D, Ergocalciferol, (DRISDOL) 50000 units CAPS capsule Take 50,000 Units by mouth every 14 (fourteen) days.     No current facility-administered medications for this visit.     SURGICAL HISTORY:  Past Surgical History:  Procedure Laterality Date  . ABDOMINAL HYSTERECTOMY    . APPENDECTOMY    . BACK SURGERY    .  ESOPHAGOGASTRODUODENOSCOPY N/A 04/17/2017   Procedure: ESOPHAGOGASTRODUODENOSCOPY (EGD);  Surgeon: Carol Ada, MD;  Location: [facility];  Service: Endoscopy;  Laterality: N/A;  . TONSILLECTOMY      REVIEW OF SYSTEMS:  A comprehensive review of systems was negative except for: Constitutional: positive for fatigue Respiratory: positive for dyspnea on exertion Musculoskeletal: positive for arthralgias and muscle weakness   PHYSICAL EXAMINATION: General appearance: alert, cooperative, fatigued and no distress Head: Normocephalic, without obvious abnormality, atraumatic Neck: no adenopathy, no JVD, supple, symmetrical, trachea midline and thyroid not enlarged, symmetric, no tenderness/mass/nodules Lymph nodes: Cervical, supraclavicular, and axillary nodes normal. Resp: clear to auscultation bilaterally Back: symmetric, no curvature. ROM normal. No CVA tenderness. Cardio: regular rate and rhythm, S1, S2 normal, no murmur, click, rub or gallop GI: soft, non-tender; bowel sounds normal; no masses,  no organomegaly Extremities: extremities normal, atraumatic, no cyanosis or edema  ECOG PERFORMANCE STATUS: 2 - Symptomatic, <50% confined to bed  Blood pressure (!) 149/97, pulse (!) 130, temperature 98.9 F (37.2 C), temperature source Oral, resp. rate 20, height 4\' 11"  (1.499 m), weight 84 lb 8 oz (38.3 kg), SpO2 (!) 88 %.  LABORATORY DATA: Lab Results  Component Value Date   WBC 13.3 (H) 08/18/2018   HGB 12.8 08/18/2018   HCT 39.6 08/18/2018   MCV 94.7 08/18/2018   PLT 233 08/18/2018      Chemistry      Component Value Date/Time   NA 131 (L) 05/29/2018 1520   K 3.9 05/29/2018 1520   CL 93 (L) 05/29/2018 1520   CO2 32 05/29/2018 1520   BUN 16 05/29/2018 1520   CREATININE 0.59 05/29/2018 1520      Component Value Date/Time   CALCIUM 9.0 05/29/2018 1520   ALKPHOS 72 05/29/2018 1520   AST 25 05/29/2018 1520   ALT 23 05/29/2018 1520   BILITOT 0.6 05/29/2018 1520        RADIOGRAPHIC STUDIES: No results found.  ASSESSMENT AND PLAN: This is a very pleasant 84 years old white female with history of iron deficiency anemia secondary to GI blood loss as well as intolerability to oral iron tablets.  She was treated in the past with iron infusion with improvement of her symptoms. Repeat CBC today showed normal hemoglobin and hematocrit but the iron study and ferritin are low today. I will arrange for the patient to have Feraheme infusion 510 mg IV weekly for 2 weeks starting later this week. I will also advised the patient to reach out to Dr. Benson Norway for evaluation of the black tarry stool. I will see her back for follow-up visit in 3 months for evaluation with repeat CBC, iron study and ferritin. The patient was advised to call immediately if she has any  concerning symptoms in the interval. The patient voices understanding of current disease status and treatment options and is in agreement with the current care plan.  All questions were answered. The patient knows to call the clinic with any problems, questions or concerns. We can certainly see the patient much sooner if necessary.  I spent 10 minutes counseling the patient face to face. The total time spent in the appointment was 15 minutes.  Disclaimer: This note was dictated with voice recognition software. Similar sounding words can inadvertently be transcribed and may not be corrected upon review.

## 2018-08-19 ENCOUNTER — Telehealth: Payer: Self-pay | Admitting: Internal Medicine

## 2018-08-19 NOTE — Telephone Encounter (Signed)
Returned call to [employer] who lmovm asking for pt appts as she is her POA and transportation. Pt could not remember prior discussion. Unable to reach [facility] at [phone] ( # left by Hassan Rowan on prior VM). LMOVM for Hassan Rowan with appt dates/times.

## 2018-08-19 NOTE — Telephone Encounter (Signed)
Scheduled appt per 12/4 sch message - pt friend is aware of apt date and time - per patient said to call this friend and give her appt info

## 2018-08-20 ENCOUNTER — Ambulatory Visit (HOSPITAL_BASED_OUTPATIENT_CLINIC_OR_DEPARTMENT_OTHER): Payer: Medicare Other | Admitting: Medical

## 2018-08-20 ENCOUNTER — Inpatient Hospital Stay: Payer: Medicare Other

## 2018-08-20 VITALS — BP 107/58 | HR 96 | Temp 99.4°F | Resp 22

## 2018-08-20 DIAGNOSIS — R05 Cough: Secondary | ICD-10-CM

## 2018-08-20 DIAGNOSIS — R0600 Dyspnea, unspecified: Secondary | ICD-10-CM | POA: Diagnosis not present

## 2018-08-20 DIAGNOSIS — D62 Acute posthemorrhagic anemia: Secondary | ICD-10-CM

## 2018-08-20 DIAGNOSIS — A419 Sepsis, unspecified organism: Secondary | ICD-10-CM | POA: Diagnosis not present

## 2018-08-20 MED ORDER — SODIUM CHLORIDE 0.9 % IV SOLN
Freq: Once | INTRAVENOUS | Status: AC
  Administered 2018-08-20: 15:00:00 via INTRAVENOUS
  Filled 2018-08-20: qty 250

## 2018-08-20 MED ORDER — SODIUM CHLORIDE 0.9 % IV SOLN
510.0000 mg | Freq: Once | INTRAVENOUS | Status: AC
  Administered 2018-08-20: 510 mg via INTRAVENOUS
  Filled 2018-08-20: qty 17

## 2018-08-20 NOTE — Patient Instructions (Signed)
Ferumoxytol (Feraheme) injection What is this medicine? FERUMOXYTOL is an iron complex. Iron is used to make healthy red blood cells, which carry oxygen and nutrients throughout the body. This medicine is used to treat iron deficiency anemia in people with chronic kidney disease. This medicine may be used for other purposes; ask your health care provider or pharmacist if you have questions. COMMON BRAND NAME(S): Feraheme What should I tell my health care provider before I take this medicine? They need to know if you have any of these conditions: -anemia not caused by low iron levels -high levels of iron in the blood -magnetic resonance imaging (MRI) test scheduled -an unusual or allergic reaction to iron, other medicines, foods, dyes, or preservatives -pregnant or trying to get pregnant -breast-feeding How should I use this medicine? This medicine is for injection into a vein. It is given by a health care professional in a hospital or clinic setting. Talk to your pediatrician regarding the use of this medicine in children. Special care may be needed. Overdosage: If you think you have taken too much of this medicine contact a poison control center or emergency room at once. NOTE: This medicine is only for you. Do not share this medicine with others. What if I miss a dose? It is important not to miss your dose. Call your doctor or health care professional if you are unable to keep an appointment. What may interact with this medicine? This medicine may interact with the following medications: -other iron products This list may not describe all possible interactions. Give your health care provider a list of all the medicines, herbs, non-prescription drugs, or dietary supplements you use. Also tell them if you smoke, drink alcohol, or use illegal drugs. Some items may interact with your medicine. What should I watch for while using this medicine? Visit your doctor or healthcare professional  regularly. Tell your doctor or healthcare professional if your symptoms do not start to get better or if they get worse. You may need blood work done while you are taking this medicine. You may need to follow a special diet. Talk to your doctor. Foods that contain iron include: whole grains/cereals, dried fruits, beans, or peas, leafy green vegetables, and organ meats (liver, kidney). What side effects may I notice from receiving this medicine? Side effects that you should report to your doctor or health care professional as soon as possible: -allergic reactions like skin rash, itching or hives, swelling of the face, lips, or tongue -breathing problems -changes in blood pressure -feeling faint or lightheaded, falls -fever or chills -flushing, sweating, or hot feelings -swelling of the ankles or feet Side effects that usually do not require medical attention (report to your doctor or health care professional if they continue or are bothersome): -diarrhea -headache -nausea, vomiting -stomach pain This list may not describe all possible side effects. Call your doctor for medical advice about side effects. You may report side effects to FDA at 1-800-FDA-1088. Where should I keep my medicine? This drug is given in a hospital or clinic and will not be stored at home. NOTE: This sheet is a summary. It may not cover all possible information. If you have questions about this medicine, talk to your doctor, pharmacist, or health care provider.  2018 Elsevier/Gold Standard (2015-10-04 12:41:49)

## 2018-08-23 ENCOUNTER — Other Ambulatory Visit: Payer: Self-pay

## 2018-08-23 ENCOUNTER — Encounter (HOSPITAL_COMMUNITY): Payer: Self-pay | Admitting: Emergency Medicine

## 2018-08-23 ENCOUNTER — Inpatient Hospital Stay (HOSPITAL_COMMUNITY): Payer: Medicare Other

## 2018-08-23 ENCOUNTER — Inpatient Hospital Stay (HOSPITAL_COMMUNITY)
Admission: EM | Admit: 2018-08-23 | Discharge: 2018-09-15 | DRG: 871 | Disposition: E | Payer: Medicare Other | Source: Skilled Nursing Facility | Attending: Pulmonary Disease | Admitting: Pulmonary Disease

## 2018-08-23 ENCOUNTER — Emergency Department (HOSPITAL_COMMUNITY): Payer: Medicare Other

## 2018-08-23 DIAGNOSIS — R0600 Dyspnea, unspecified: Secondary | ICD-10-CM | POA: Diagnosis present

## 2018-08-23 DIAGNOSIS — Z781 Physical restraint status: Secondary | ICD-10-CM | POA: Diagnosis not present

## 2018-08-23 DIAGNOSIS — Z7982 Long term (current) use of aspirin: Secondary | ICD-10-CM

## 2018-08-23 DIAGNOSIS — Y92129 Unspecified place in nursing home as the place of occurrence of the external cause: Secondary | ICD-10-CM | POA: Diagnosis not present

## 2018-08-23 DIAGNOSIS — K5900 Constipation, unspecified: Secondary | ICD-10-CM | POA: Diagnosis present

## 2018-08-23 DIAGNOSIS — R451 Restlessness and agitation: Secondary | ICD-10-CM | POA: Diagnosis present

## 2018-08-23 DIAGNOSIS — Z9049 Acquired absence of other specified parts of digestive tract: Secondary | ICD-10-CM | POA: Diagnosis not present

## 2018-08-23 DIAGNOSIS — I471 Supraventricular tachycardia: Secondary | ICD-10-CM | POA: Diagnosis present

## 2018-08-23 DIAGNOSIS — K219 Gastro-esophageal reflux disease without esophagitis: Secondary | ICD-10-CM | POA: Diagnosis present

## 2018-08-23 DIAGNOSIS — M6282 Rhabdomyolysis: Secondary | ICD-10-CM | POA: Diagnosis present

## 2018-08-23 DIAGNOSIS — E785 Hyperlipidemia, unspecified: Secondary | ICD-10-CM | POA: Diagnosis present

## 2018-08-23 DIAGNOSIS — Z90710 Acquired absence of both cervix and uterus: Secondary | ICD-10-CM

## 2018-08-23 DIAGNOSIS — Z681 Body mass index (BMI) 19 or less, adult: Secondary | ICD-10-CM

## 2018-08-23 DIAGNOSIS — Z8673 Personal history of transient ischemic attack (TIA), and cerebral infarction without residual deficits: Secondary | ICD-10-CM | POA: Diagnosis not present

## 2018-08-23 DIAGNOSIS — R262 Difficulty in walking, not elsewhere classified: Secondary | ICD-10-CM

## 2018-08-23 DIAGNOSIS — R0902 Hypoxemia: Secondary | ICD-10-CM | POA: Diagnosis not present

## 2018-08-23 DIAGNOSIS — J189 Pneumonia, unspecified organism: Secondary | ICD-10-CM | POA: Diagnosis present

## 2018-08-23 DIAGNOSIS — Z66 Do not resuscitate: Secondary | ICD-10-CM | POA: Diagnosis not present

## 2018-08-23 DIAGNOSIS — E876 Hypokalemia: Secondary | ICD-10-CM | POA: Diagnosis not present

## 2018-08-23 DIAGNOSIS — W1830XA Fall on same level, unspecified, initial encounter: Secondary | ICD-10-CM | POA: Diagnosis present

## 2018-08-23 DIAGNOSIS — D72828 Other elevated white blood cell count: Secondary | ICD-10-CM | POA: Diagnosis present

## 2018-08-23 DIAGNOSIS — J9690 Respiratory failure, unspecified, unspecified whether with hypoxia or hypercapnia: Secondary | ICD-10-CM

## 2018-08-23 DIAGNOSIS — J9811 Atelectasis: Secondary | ICD-10-CM | POA: Diagnosis not present

## 2018-08-23 DIAGNOSIS — R6521 Severe sepsis with septic shock: Secondary | ICD-10-CM | POA: Diagnosis present

## 2018-08-23 DIAGNOSIS — E43 Unspecified severe protein-calorie malnutrition: Secondary | ICD-10-CM | POA: Diagnosis present

## 2018-08-23 DIAGNOSIS — Z79899 Other long term (current) drug therapy: Secondary | ICD-10-CM

## 2018-08-23 DIAGNOSIS — M1990 Unspecified osteoarthritis, unspecified site: Secondary | ICD-10-CM | POA: Diagnosis present

## 2018-08-23 DIAGNOSIS — T796XXA Traumatic ischemia of muscle, initial encounter: Secondary | ICD-10-CM | POA: Diagnosis not present

## 2018-08-23 DIAGNOSIS — J90 Pleural effusion, not elsewhere classified: Secondary | ICD-10-CM | POA: Diagnosis present

## 2018-08-23 DIAGNOSIS — Z515 Encounter for palliative care: Secondary | ICD-10-CM | POA: Diagnosis not present

## 2018-08-23 DIAGNOSIS — J9601 Acute respiratory failure with hypoxia: Secondary | ICD-10-CM | POA: Diagnosis present

## 2018-08-23 DIAGNOSIS — R410 Disorientation, unspecified: Secondary | ICD-10-CM | POA: Diagnosis present

## 2018-08-23 DIAGNOSIS — A419 Sepsis, unspecified organism: Secondary | ICD-10-CM | POA: Diagnosis present

## 2018-08-23 DIAGNOSIS — R627 Adult failure to thrive: Secondary | ICD-10-CM | POA: Diagnosis present

## 2018-08-23 DIAGNOSIS — H401433 Capsular glaucoma with pseudoexfoliation of lens, bilateral, severe stage: Secondary | ICD-10-CM | POA: Diagnosis not present

## 2018-08-23 LAB — CBC WITH DIFFERENTIAL/PLATELET
Abs Immature Granulocytes: 0.15 10*3/uL — ABNORMAL HIGH (ref 0.00–0.07)
Basophils Absolute: 0.1 10*3/uL (ref 0.0–0.1)
Basophils Relative: 0 %
EOS ABS: 0.1 10*3/uL (ref 0.0–0.5)
Eosinophils Relative: 0 %
HCT: 44.8 % (ref 36.0–46.0)
Hemoglobin: 14.5 g/dL (ref 12.0–15.0)
IMMATURE GRANULOCYTES: 1 %
Lymphocytes Relative: 1 %
Lymphs Abs: 0.2 10*3/uL — ABNORMAL LOW (ref 0.7–4.0)
MCH: 30.6 pg (ref 26.0–34.0)
MCHC: 32.4 g/dL (ref 30.0–36.0)
MCV: 94.5 fL (ref 80.0–100.0)
Monocytes Absolute: 0.4 10*3/uL (ref 0.1–1.0)
Monocytes Relative: 2 %
Neutro Abs: 20 10*3/uL — ABNORMAL HIGH (ref 1.7–7.7)
Neutrophils Relative %: 96 %
Platelets: 233 10*3/uL (ref 150–400)
RBC: 4.74 MIL/uL (ref 3.87–5.11)
RDW: 13.8 % (ref 11.5–15.5)
WBC: 20.9 10*3/uL — ABNORMAL HIGH (ref 4.0–10.5)
nRBC: 0 % (ref 0.0–0.2)

## 2018-08-23 LAB — BASIC METABOLIC PANEL
Anion gap: 13 (ref 5–15)
BUN: 40 mg/dL — ABNORMAL HIGH (ref 8–23)
CO2: 26 mmol/L (ref 22–32)
Calcium: 10.2 mg/dL (ref 8.9–10.3)
Chloride: 92 mmol/L — ABNORMAL LOW (ref 98–111)
Creatinine, Ser: 0.54 mg/dL (ref 0.44–1.00)
GFR calc Af Amer: >60 mL/min (ref >60)
Glucose, Bld: 75 mg/dL (ref 70–99)
Potassium: 3.7 mmol/L (ref 3.5–5.1)
Sodium: 131 mmol/L — ABNORMAL LOW (ref 135–145)

## 2018-08-23 LAB — BLOOD GAS, ARTERIAL
Acid-base deficit: 3.6 mmol/L — ABNORMAL HIGH (ref 0.0–2.0)
Bicarbonate: 20.8 mmol/L (ref 20.0–28.0)
Drawn by: 225631
FIO2: 60
MECHVT: 0.43 mL
Mechanical Rate: 16
O2 Saturation: 97.2 %
PATIENT TEMPERATURE: 37.8
PEEP: 5 cmH2O
RATE: 18 resp/min
pCO2 arterial: 38.8 mmHg (ref 32.0–48.0)
pH, Arterial: 7.354 (ref 7.350–7.450)
pO2, Arterial: 99.1 mmHg (ref 83.0–108.0)

## 2018-08-23 LAB — URINALYSIS, ROUTINE W REFLEX MICROSCOPIC
Bilirubin Urine: NEGATIVE
Glucose, UA: NEGATIVE mg/dL
Ketones, ur: 5 mg/dL — AB
Nitrite: NEGATIVE
Protein, ur: 30 mg/dL — AB
Specific Gravity, Urine: 1.017 (ref 1.005–1.030)
pH: 5 (ref 5.0–8.0)

## 2018-08-23 LAB — PROCALCITONIN: Procalcitonin: 5.46 ng/mL

## 2018-08-23 LAB — CK: Total CK: 1822 U/L — ABNORMAL HIGH (ref 38–234)

## 2018-08-23 LAB — TSH: TSH: 4.206 u[IU]/mL (ref 0.350–4.500)

## 2018-08-23 MED ORDER — ASPIRIN EC 81 MG PO TBEC
81.0000 mg | DELAYED_RELEASE_TABLET | Freq: Every day | ORAL | Status: DC
  Administered 2018-08-24: 81 mg via ORAL
  Filled 2018-08-23: qty 1

## 2018-08-23 MED ORDER — DORZOLAMIDE HCL-TIMOLOL MAL 2-0.5 % OP SOLN
1.0000 [drp] | Freq: Two times a day (BID) | OPHTHALMIC | Status: DC
  Administered 2018-08-24 – 2018-08-25 (×4): 1 [drp] via OPHTHALMIC
  Filled 2018-08-23: qty 10

## 2018-08-23 MED ORDER — PANTOPRAZOLE SODIUM 40 MG PO TBEC: 40.0000 mg | DELAYED_RELEASE_TABLET | Freq: Every day | ORAL | Status: DC

## 2018-08-23 MED ORDER — ALPRAZOLAM 0.5 MG PO TABS
0.5000 mg | ORAL_TABLET | Freq: Two times a day (BID) | ORAL | Status: DC | PRN
  Administered 2018-08-23: 0.5 mg via ORAL
  Filled 2018-08-23: qty 1

## 2018-08-23 MED ORDER — GABAPENTIN 100 MG PO CAPS: 100.0000 mg | ORAL_CAPSULE | Freq: Three times a day (TID) | ORAL | Status: DC

## 2018-08-23 MED ORDER — VANCOMYCIN HCL IN DEXTROSE 750-5 MG/150ML-% IV SOLN
750.0000 mg | Freq: Once | INTRAVENOUS | Status: AC
  Administered 2018-08-23: 750 mg via INTRAVENOUS
  Filled 2018-08-23: qty 150

## 2018-08-23 MED ORDER — SODIUM CHLORIDE 0.9 % IV SOLN
INTRAVENOUS | Status: DC
  Administered 2018-08-23 – 2018-08-24 (×2): via INTRAVENOUS

## 2018-08-23 MED ORDER — TRAMADOL HCL 50 MG PO TABS
50.0000 mg | ORAL_TABLET | Freq: Three times a day (TID) | ORAL | Status: DC | PRN
  Administered 2018-08-23: 50 mg via ORAL
  Filled 2018-08-23: qty 1

## 2018-08-23 MED ORDER — SODIUM CHLORIDE 0.9 % IV BOLUS
1000.0000 mL | Freq: Once | INTRAVENOUS | Status: AC
  Administered 2018-08-23: 1000 mL via INTRAVENOUS

## 2018-08-23 MED ORDER — ORAL CARE MOUTH RINSE
15.0000 mL | OROMUCOSAL | Status: DC
  Administered 2018-08-23 – 2018-08-25 (×16): 15 mL via OROMUCOSAL

## 2018-08-23 MED ORDER — HEPARIN SODIUM (PORCINE) 5000 UNIT/ML IJ SOLN
5000.0000 [IU] | Freq: Three times a day (TID) | INTRAMUSCULAR | Status: DC
  Administered 2018-08-23 – 2018-08-25 (×5): 5000 [IU] via SUBCUTANEOUS
  Filled 2018-08-23 (×4): qty 1

## 2018-08-23 MED ORDER — SODIUM CHLORIDE 0.9 % IV SOLN: 1.0000 g | INTRAVENOUS | Status: DC

## 2018-08-23 MED ORDER — SODIUM CHLORIDE 0.9 % IV SOLN
2.0000 g | Freq: Once | INTRAVENOUS | Status: AC
  Administered 2018-08-23: 2 g via INTRAVENOUS
  Filled 2018-08-23: qty 2

## 2018-08-23 MED ORDER — ACETAMINOPHEN 325 MG PO TABS: 650.0000 mg | ORAL_TABLET | Freq: Four times a day (QID) | ORAL | Status: DC | PRN

## 2018-08-23 MED ORDER — PANTOPRAZOLE SODIUM 40 MG PO PACK
40.0000 mg | PACK | ORAL | Status: DC
  Administered 2018-08-23 – 2018-08-24 (×2): 40 mg
  Filled 2018-08-23 (×2): qty 20

## 2018-08-23 MED ORDER — NOREPINEPHRINE 4 MG/250ML-% IV SOLN
0.0000 ug/min | INTRAVENOUS | Status: AC
  Administered 2018-08-23: 2 ug/min via INTRAVENOUS
  Filled 2018-08-23: qty 250

## 2018-08-23 MED ORDER — LEVOFLOXACIN IN D5W 500 MG/100ML IV SOLN
500.0000 mg | INTRAVENOUS | Status: DC
  Filled 2018-08-23: qty 100

## 2018-08-23 MED ORDER — VITAL HIGH PROTEIN PO LIQD
1000.0000 mL | ORAL | Status: DC
  Administered 2018-08-23: 1000 mL

## 2018-08-23 MED ORDER — ONDANSETRON HCL 4 MG/2ML IJ SOLN: 4.0000 mg | Freq: Four times a day (QID) | INTRAMUSCULAR | Status: DC | PRN

## 2018-08-23 MED ORDER — CHLORHEXIDINE GLUCONATE 0.12% ORAL RINSE (MEDLINE KIT)
15.0000 mL | Freq: Two times a day (BID) | OROMUCOSAL | Status: DC
  Administered 2018-08-23: 15 mL via OROMUCOSAL

## 2018-08-23 MED ORDER — ACETAMINOPHEN 650 MG RE SUPP: 650.0000 mg | Freq: Four times a day (QID) | RECTAL | Status: DC | PRN

## 2018-08-23 MED ORDER — MIDAZOLAM HCL 2 MG/2ML IJ SOLN: 1.0000 mg | INTRAMUSCULAR | Status: DC | PRN

## 2018-08-23 MED ORDER — MIDAZOLAM HCL 2 MG/2ML IJ SOLN
1.0000 mg | INTRAMUSCULAR | Status: DC | PRN
  Administered 2018-08-25: 1 mg via INTRAVENOUS
  Filled 2018-08-23: qty 2

## 2018-08-23 MED ORDER — KETAMINE HCL 50 MG/5ML IJ SOSY
1.0000 mg/kg | PREFILLED_SYRINGE | Freq: Once | INTRAMUSCULAR | Status: AC
  Administered 2018-08-23: 39 mg via INTRAVENOUS
  Filled 2018-08-23: qty 5

## 2018-08-23 MED ORDER — PHENYLEPHRINE HCL-NACL 10-0.9 MG/250ML-% IV SOLN
0.0000 ug/min | INTRAVENOUS | Status: DC
  Administered 2018-08-23: 100 ug/min via INTRAVENOUS
  Administered 2018-08-23: 20 ug/min via INTRAVENOUS
  Administered 2018-08-24 (×4): 110 ug/min via INTRAVENOUS
  Administered 2018-08-24: 120 ug/min via INTRAVENOUS
  Administered 2018-08-24: 110 ug/min via INTRAVENOUS
  Administered 2018-08-24: 140 ug/min via INTRAVENOUS
  Administered 2018-08-24: 110 ug/min via INTRAVENOUS
  Administered 2018-08-24 (×2): 90 ug/min via INTRAVENOUS
  Administered 2018-08-24: 110 ug/min via INTRAVENOUS
  Administered 2018-08-24: 80 ug/min via INTRAVENOUS
  Administered 2018-08-24 – 2018-08-25 (×3): 100 ug/min via INTRAVENOUS
  Administered 2018-08-25 (×3): 90 ug/min via INTRAVENOUS
  Administered 2018-08-25: 100 ug/min via INTRAVENOUS
  Administered 2018-08-25: 110 ug/min via INTRAVENOUS
  Filled 2018-08-23 (×24): qty 250

## 2018-08-23 MED ORDER — POLYETHYLENE GLYCOL 3350 17 G PO PACK
17.0000 g | PACK | ORAL | Status: DC
  Administered 2018-08-24: 17 g via ORAL
  Filled 2018-08-23: qty 1

## 2018-08-23 MED ORDER — ALBUTEROL SULFATE HFA 108 (90 BASE) MCG/ACT IN AERS
1.0000 | INHALATION_SPRAY | Freq: Four times a day (QID) | RESPIRATORY_TRACT | Status: DC | PRN
  Administered 2018-08-23: 2 via RESPIRATORY_TRACT
  Filled 2018-08-23: qty 6.7

## 2018-08-23 MED ORDER — ONDANSETRON HCL 4 MG PO TABS: 4.0000 mg | ORAL_TABLET | Freq: Four times a day (QID) | ORAL | Status: DC | PRN

## 2018-08-23 MED ORDER — VANCOMYCIN HCL 500 MG IV SOLR: 500.0000 mg | INTRAVENOUS | Status: DC

## 2018-08-23 MED ORDER — LATANOPROST 0.005 % OP SOLN
1.0000 [drp] | Freq: Every day | OPHTHALMIC | Status: DC
  Administered 2018-08-24 (×2): 1 [drp] via OPHTHALMIC
  Filled 2018-08-23: qty 2.5

## 2018-08-23 MED ORDER — DOCUSATE SODIUM 50 MG/5ML PO LIQD: 100.0000 mg | Freq: Two times a day (BID) | ORAL | Status: DC | PRN

## 2018-08-23 MED ORDER — CHLORHEXIDINE GLUCONATE 0.12% ORAL RINSE (MEDLINE KIT)
15.0000 mL | Freq: Two times a day (BID) | OROMUCOSAL | Status: DC
  Administered 2018-08-24 – 2018-08-25 (×3): 15 mL via OROMUCOSAL

## 2018-08-23 MED ORDER — ORAL CARE MOUTH RINSE: 15.0000 mL | OROMUCOSAL | Status: DC

## 2018-08-23 MED ORDER — ROCURONIUM BROMIDE 50 MG/5ML IV SOLN
1.0000 mg/kg | Freq: Once | INTRAVENOUS | Status: AC
  Administered 2018-08-23: 38.6 mg via INTRAVENOUS
  Filled 2018-08-23: qty 3.86

## 2018-08-23 MED ORDER — FENTANYL 2500MCG IN NS 250ML (10MCG/ML) PREMIX INFUSION
25.0000 ug/h | INTRAVENOUS | Status: DC
  Administered 2018-08-23: 50 ug/h via INTRAVENOUS
  Administered 2018-08-24: 100 ug/h via INTRAVENOUS
  Administered 2018-08-25: 250 ug/h via INTRAVENOUS
  Filled 2018-08-23 (×4): qty 250

## 2018-08-23 MED ORDER — FENTANYL CITRATE (PF) 100 MCG/2ML IJ SOLN
50.0000 ug | INTRAMUSCULAR | Status: DC | PRN
  Administered 2018-08-25: 50 ug via INTRAVENOUS

## 2018-08-23 MED ORDER — ENOXAPARIN SODIUM 40 MG/0.4ML ~~LOC~~ SOLN: 40.0000 mg | SUBCUTANEOUS | Status: DC

## 2018-08-23 MED ORDER — SODIUM CHLORIDE 0.9 % IV BOLUS
500.0000 mL | Freq: Once | INTRAVENOUS | Status: AC
  Administered 2018-08-23: 500 mL via INTRAVENOUS

## 2018-08-23 NOTE — Progress Notes (Signed)
I was asked to see this patient while in the infusion room today.  Jeanne Cruz reports that she has had a "low-grade temperature" of 99.6.  She reports that she is having ongoing congestion for the last 2 months.  She coughs up thick mucus.  She denies any other evidence of an upper respiratory tract infection.  She has been pushing fluids.  I attempted to reassure the patient that it does not appear that she has any active infection.  I have recommended to her that she continue to push fluids and return as needed.  Sandi Mealy, MHS, PA-C Physician Assistant

## 2018-08-23 NOTE — ED Provider Notes (Signed)
Called to bedside for worsening respiratory status.  On arrival to the room, patient altered, essentially unresponsive, with tachypnea to the 40s.  I reviewed her records and imaging.  Atelectasis noted on initial chest x-ray but given her significant leukocytosis, concern for possible pneumonia versus lung collapse.  Patient placed on BiPAP temporarily without improvement.  I had a long discussion with the patient's granddaughter and healthcare power of attorney via telephone.  Patient is full code temporarily.  Decision subsequently made to intubate.  Patient notably hypotensive prior to intubation.  Patient started on 2 L fluid resuscitation and peripheral levo fed prior to intubation.  Patient was then intubated uneventfully.  She did, possibly, have a small run of SVT after intubation, with hypotension, but this resolved spontaneously with fluids.  Discussed with eICU and will admit.    Duffy Bruce, MD 08/18/2018 1911

## 2018-08-23 NOTE — ED Notes (Signed)
Dr Myrene Buddy called about patient in respiratory distress

## 2018-08-23 NOTE — ED Notes (Signed)
Hospitalist paged on patient decline

## 2018-08-23 NOTE — ED Notes (Signed)
Hospitalist paged on patient decline again

## 2018-08-23 NOTE — Progress Notes (Signed)
Pt was placed on BIPAP and then Immediately intubated.

## 2018-08-23 NOTE — ED Notes (Signed)
Intubation successful

## 2018-08-23 NOTE — ED Notes (Signed)
Bed: ZV47 Expected date:  Expected time:  Means of arrival:  Comments: EMS fall 84 yo female

## 2018-08-23 NOTE — ED Notes (Signed)
Per [facility] Levo up to 68mcg/min

## 2018-08-23 NOTE — Progress Notes (Signed)
Pharmacy Antibiotic Note  Jeanne Cruz is a 84 y.o. female admitted on 08/16/2018 with sepsis.  Pharmacy has been consulted for Vancomycin & Cefepime dosing.  Plan: Cefepime 2gm x1, then 1gm q24 Vancomycin 750mg  x1, then 500mg  q36hr  Height: 5\' 4"  (162.6 cm) Weight: 85 lb (38.6 kg) IBW/kg (Calculated) : 54.7  Temp (24hrs), Avg:99.5 F (37.5 C), Min:97.6 F (36.4 C), Max:101.3 F (38.5 C)  Recent Labs  Lab 08/18/18 1124 09/02/2018 0921  WBC 13.3* 20.9*  CREATININE  --  0.54    Estimated Creatinine Clearance: 29.6 mL/min (by C-G formula based on SCr of 0.54 mg/dL).    Allergies  Allergen Reactions  . Ampicillin Diarrhea and Other (See Comments)    Has patient had a PCN reaction causing immediate rash, facial/tongue/throat swelling, SOB or lightheadedness with hypotension: No Has patient had a PCN reaction causing severe rash involving mucus membranes or skin necrosis: No Has patient had a PCN reaction that required hospitalization: No Has patient had a PCN reaction occurring within the last 10 years: No If all of the above answers are "NO", then may proceed with Cephalosporin use.  . Codeine Other (See Comments)    Reaction:  Somnolence   . Penicillins Diarrhea and Other (See Comments)    Has patient had a PCN reaction causing immediate rash, facial/tongue/throat swelling, SOB or lightheadedness with hypotension: No Has patient had a PCN reaction causing severe rash involving mucus membranes or skin necrosis: No Has patient had a PCN reaction that required hospitalization: No Has patient had a PCN reaction occurring within the last 10 years: No If all of the above answers are "NO", then may proceed with Cephalosporin use.   Antimicrobials this admission: 12/9 Cefepime >>  12/9 Vancomycin >>   Dose adjustments this admission:  Microbiology results: None ordered  Thank you for allowing pharmacy to be a part of this patient's care.  Minda Ditto PharmD Pager  [phone] 08/18/2018, 6:53 PM

## 2018-08-23 NOTE — ED Notes (Signed)
U/A and culture sent to lab. [employer]

## 2018-08-23 NOTE — ED Notes (Signed)
Pt monitor ringing. This writer went to bedside and adjusted location of pulse oximetry. Oxygen on RA 96%; pulse oximetry heart rate noted to be 150s; confirmed such with 5 lead EKG. Hospitalist paged per secretary to advise heart rate in the 150s. Primary RN made aware of all the above.

## 2018-08-23 NOTE — ED Provider Notes (Signed)
.  Critical Care Performed by: Duffy Bruce, MD Authorized by: Duffy Bruce, MD   Critical care provider statement:    Critical care time (minutes):  33   Critical care time was exclusive of:  Separately billable procedures and treating other patients and teaching time   Critical care was necessary to treat or prevent imminent or life-threatening deterioration of the following conditions:  Sepsis, circulatory failure, cardiac failure and respiratory failure   Critical care was time spent personally by me on the following activities:  Development of treatment plan with patient or surrogate, discussions with consultants, evaluation of patient's response to treatment, examination of patient, obtaining history from patient or surrogate, ordering and performing treatments and interventions, ordering and review of laboratory studies, ordering and review of radiographic studies, pulse oximetry, re-evaluation of patient's condition and review of old charts   I assumed direction of critical care for this patient from another provider in my specialty: no   Procedure Name: Intubation Date/Time: 09/12/2018 7:12 PM Performed by: Duffy Bruce, MD Pre-anesthesia Checklist: Patient identified, Patient being monitored, Emergency Drugs available, Timeout performed and Suction available Oxygen Delivery Method: Non-rebreather mask Preoxygenation: Pre-oxygenation with 100% oxygen Induction Type: Rapid sequence Ventilation: Mask ventilation without difficulty Laryngoscope Size: Glidescope and 3 Grade View: Grade I Tube size: 7.0 mm Number of attempts: 1 Airway Equipment and Method: Video-laryngoscopy Placement Confirmation: ETT inserted through vocal cords under direct vision,  CO2 detector and Breath sounds checked- equal and bilateral Secured at: 20 cm Tube secured with: ETT holder Dental Injury: Teeth and Oropharynx as per pre-operative assessment  Difficulty Due To: Difficulty was  unanticipated Future Recommendations: Recommend- induction with short-acting agent, and alternative techniques readily available    OG placement Date/Time: 08/22/2018 7:12 PM Performed by: Duffy Bruce, MD Authorized by: Duffy Bruce, MD  Consent: The procedure was performed in an emergent situation. Required items: required blood products, implants, devices, and special equipment available Patient identity confirmed: arm band Time out: Immediately prior to procedure a "time out" was called to verify the correct patient, procedure, equipment, support staff and site/side marked as required. Preparation: Patient was prepped and draped in the usual sterile fashion. Local anesthesia used: no  Anesthesia: Local anesthesia used: no  Sedation: Patient sedated: no  Patient tolerance: Patient tolerated the procedure well with no immediate complications Comments: OG tube inserted by myself due to difficulty with placement per nursing.  I used a kaleidoscope to directly visualize passage of the OG tube posterior to the vocal cords into the esophagus.    IV Placement: Due to difficulty obtaining access by nursing, patient was placed supine and 20-gauge peripheral IV was placed in the right EJ by myself.  Patient tolerated well with good return of blood flow.    Duffy Bruce, MD 08/19/2018 [phone]

## 2018-08-23 NOTE — Progress Notes (Signed)
eLink Physician-Brief Progress Note Patient Name: Jeanne Cruz DOB: May 03, 1928 MRN: [id number]   Date of Service  08/31/2018  HPI/Events of Note  Agitation - Request for soft bilateral wrist restraints.   eICU Interventions  Will order soft bilateral wrist restraints.      Intervention Category Major Interventions: Delirium, psychosis, severe agitation - evaluation and management  Sommer,Steven Eugene 08/30/2018, 9:59 PM

## 2018-08-23 NOTE — Progress Notes (Signed)
eLink Physician-Brief Progress Note Patient Name: Jeanne Cruz DOB: 11/18/1927 MRN: [id number]   Date of Service  08/29/2018  HPI/Events of Note  84 yo female fell at assisted living facility and was not found until 11 hours later.  She has progressive failure to thrive.  Found to have Lt sided pneumonia.  Developed respiratory failure and required intubation. PCCM has admitted the patient VSS.  eICU Interventions  No new orders.      Intervention Category Evaluation Type: New Patient Evaluation  Lysle Dingwall 09/08/2018, 9:04 PM

## 2018-08-23 NOTE — H&P (Signed)
History and Physical    STARLIT RABURN TGG:[id number] DOB: 04-06-1928 DOA: 08/22/2018  [facility]: [facility] [place]   Patient coming from: Assisted living  Chief Complaint: fall and muscle aches.   HPI: Jeanne Cruz is a 84 y.o. female with medical history significant of severe osteoarthritis, anxiety, neuropathy and iron deficiency anemia who presents after suffering a mechanical fall.  Yesterday around 10 PM she got out of her bed, stood up and immediately fall down.  Unclear about prodromal dizziness or lightheadedness, but unfortunately she was not able to stand back on her feet, due to weakness.  She was not found until this morning around 9 AM, and she was brought to the hospital for further evaluation.  Currently patient has muscle aches, generalized, severe in intensity, worse with movement, no improving factors, no radiation.  No associated nausea or vomiting.  She claims to be very thirsty.   ED Course: Patient was found severely dehydrated, elevated CK, received IV fluids, referred for admission for evaluation.  Review of Systems:  1. General: No fevers, no chills, no weight gain or weight loss 2. ENT: No runny nose or sore throat, no hearing disturbances 3. Pulmonary: No dyspnea, cough, wheezing, or hemoptysis 4. Cardiovascular: No angina, claudication, lower extremity edema, pnd or orthopnea 5. Gastrointestinal: No nausea or vomiting, no diarrhea or constipation 6. Hematology: No easy bruisability or frequent infections 7. Urology: No dysuria, hematuria or increased urinary frequency 8. Dermatology: No rashes. 9. Neurology: No seizures or paresthesias 10. Musculoskeletal: Positive muscle aches as mentioned in the HPI.   Past Medical History:  Diagnosis Date  . Anemia   . Anxiety   . Arthritis   . Blood transfusion without reported diagnosis   . Degenerative disc disease, cervical   . GI bleeding   . Iron deficiency anemia   . Neuropathy   . Postherpetic  neuralgia   . Shingles   . Stroke [facility])     Past Surgical History:  Procedure Laterality Date  . ABDOMINAL HYSTERECTOMY    . APPENDECTOMY    . BACK SURGERY    . ESOPHAGOGASTRODUODENOSCOPY N/A 04/17/2017   Procedure: ESOPHAGOGASTRODUODENOSCOPY (EGD);  Surgeon: Carol Ada, MD;  Location: [facility];  Service: Endoscopy;  Laterality: N/A;  . TONSILLECTOMY       reports that she has never smoked. She has never used smokeless tobacco. She reports that she does not drink alcohol or use drugs.  Allergies  Allergen Reactions  . Ampicillin Diarrhea and Other (See Comments)    Has patient had a PCN reaction causing immediate rash, facial/tongue/throat swelling, SOB or lightheadedness with hypotension: No Has patient had a PCN reaction causing severe rash involving mucus membranes or skin necrosis: No Has patient had a PCN reaction that required hospitalization: No Has patient had a PCN reaction occurring within the last 10 years: No If all of the above answers are "NO", then may proceed with Cephalosporin use.  . Codeine Other (See Comments)    Reaction:  Somnolence   . Penicillins Diarrhea and Other (See Comments)    Has patient had a PCN reaction causing immediate rash, facial/tongue/throat swelling, SOB or lightheadedness with hypotension: No Has patient had a PCN reaction causing severe rash involving mucus membranes or skin necrosis: No Has patient had a PCN reaction that required hospitalization: No Has patient had a PCN reaction occurring within the last 10 years: No If all of the above answers are "NO", then may proceed with Cephalosporin use.  Family History  Problem Relation Age of Onset  . Colon cancer Sister   . Heart attack Sister      Prior to Admission medications   Medication Sig Start Date End Date Taking? Authorizing Provider  albuterol (PROVENTIL HFA;VENTOLIN HFA) 108 (90 Base) MCG/ACT inhaler Inhale 1-2 puffs into the lungs every 6 (six) hours as needed for  wheezing or shortness of breath.    Yes [provider]  ALPRAZolam (XANAX) 0.5 MG tablet Take 1 tablet (0.5 mg total) by mouth 2 (two) times daily as needed for anxiety or sleep. 03/18/18  Yes Charlynne Cousins, MD  aspirin EC 81 MG tablet Take 1 tablet by mouth daily. 03/21/18  Yes [provider]  B Complex Vitamins (VITAMIN B COMPLEX IJ) Inject 1 vial into the muscle every 30 (thirty) days.    Yes [provider]  dorzolamide-timolol (COSOPT) 22.3-6.8 MG/ML ophthalmic solution Place 1 drop into both eyes 2 (two) times daily.    Yes [provider]  gabapentin (NEURONTIN) 100 MG capsule Take 100 mg by mouth 3 (three) times daily.    Yes [provider]  LUMIGAN 0.01 % SOLN Place 1 drop into both eyes daily. 07/24/18  Yes [provider]  Multiple Vitamin (MULTIVITAMIN WITH MINERALS) TABS tablet Take 1 tablet by mouth daily.   Yes [provider]  nystatin (NYSTATIN) powder Apply 2 g topically 2 (two) times daily as needed (irritation).    Yes [provider]  pantoprazole (PROTONIX) 40 MG tablet Take 1 tablet (40 mg total) by mouth 2 (two) times daily. Patient taking differently: Take 40 mg by mouth daily.  04/17/17  Yes Dessa Phi, DO  polyethylene glycol (MIRALAX / GLYCOLAX) packet Take 17 g by mouth every other day.    Yes [provider]  traMADol (ULTRAM) 50 MG tablet Take 1 tablet (50 mg total) by mouth every 6 (six) hours as needed for moderate pain. Patient taking differently: Take 50 mg by mouth every 8 (eight) hours as needed for moderate pain.  03/18/18  Yes Charlynne Cousins, MD  Vitamin D, Ergocalciferol, (DRISDOL) 50000 units CAPS capsule Take 50,000 Units by mouth every 14 (fourteen) days.   Yes [provider]  atorvastatin (LIPITOR) 20 MG tablet Take 1 tablet (20 mg total) by mouth daily at 6 PM. Patient not taking: Reported on 10/27/2017 04/28/17   Debbe Odea, MD  potassium chloride SA  (K-DUR,KLOR-CON) 20 MEQ tablet Take 1 tablet (20 mEq total) by mouth 2 (two) times daily for 2 days. Patient not taking: Reported on 09/04/2018 10/27/17 10/29/17  Duffy Bruce, MD    Physical Exam: Vitals:   09/07/2018 0749 08/17/2018 0752 08/31/2018 0945  BP: 126/77  (!) 147/132  Resp: 16    Temp: 97.6 F (36.4 C)    TempSrc: Oral    Weight:  38.6 kg   Height:  5\' 4"  (1.626 m)     Vitals:   08/26/2018 0749 09/10/2018 0752 08/30/2018 0945  BP: 126/77  (!) 147/132  Resp: 16    Temp: 97.6 F (36.4 C)    TempSrc: Oral    Weight:  38.6 kg   Height:  5\' 4"  (1.626 m)    General: deconditioned and ill looking appearing Neurology: Awake and alert, non focal, generalized weakness.  Head and Neck. Head normocephalic. Neck supple with no adenopathy or thyromegaly.   E ENT: positive pallor, no icterus, oral mucosa dry. Positive accessory muscle use.  Cardiovascular: No JVD. S1-S2  present, rhythmic, no gallops, rubs, or murmurs. No lower extremity edema. Pulmonary: decreased breath sounds bilaterally, decreased air movement, with left basilar hypoventilation.  No wheezing, rhonchi or rales. Gastrointestinal. Abdomen with, no organomegaly, non tender, no rebound or guarding Skin. Positive cyanosis, finger and toes.  Musculoskeletal: severe distal phalangeal and metatarsal joint deformities, thickened right foot first metatarsal deformity.    Labs on Admission: I have personally reviewed following labs and imaging studies  CBC: Recent Labs  Lab 08/18/18 1124 08/24/2018 0921  WBC 13.3* 20.9*  NEUTROABS 11.1* 20.0*  HGB 12.8 14.5  HCT 39.6 44.8  MCV 94.7 94.5  PLT 233 885   Basic Metabolic Panel: Recent Labs  Lab 09/03/2018 0921  NA 131*  K 3.7  CL 92*  CO2 26  GLUCOSE 75  BUN 40*  CREATININE 0.54  CALCIUM 10.2   GFR: Estimated Creatinine Clearance: 29.6 mL/min (by C-G formula based on SCr of 0.54 mg/dL). Liver Function Tests: No results for input(s): AST, ALT, ALKPHOS, BILITOT,  PROT, ALBUMIN in the last 168 hours. No results for input(s): LIPASE, AMYLASE in the last 168 hours. No results for input(s): AMMONIA in the last 168 hours. Coagulation Profile: No results for input(s): INR, PROTIME in the last 168 hours. Cardiac Enzymes: Recent Labs  Lab 08/22/2018 0921  CKTOTAL 1,822*   BNP (last 3 results) No results for input(s): PROBNP in the last 8760 hours. HbA1C: No results for input(s): HGBA1C in the last 72 hours. CBG: No results for input(s): GLUCAP in the last 168 hours. Lipid Profile: No results for input(s): CHOL, HDL, LDLCALC, TRIG, CHOLHDL, LDLDIRECT in the last 72 hours. Thyroid Function Tests: No results for input(s): TSH, T4TOTAL, FREET4, T3FREE, THYROIDAB in the last 72 hours. Anemia Panel: No results for input(s): VITAMINB12, FOLATE, FERRITIN, TIBC, IRON, RETICCTPCT in the last 72 hours. Urine analysis:    Component Value Date/Time   COLORURINE YELLOW 05/29/2018 1621   APPEARANCEUR CLEAR 05/29/2018 1621   LABSPEC 1.010 05/29/2018 1621   PHURINE 7.0 05/29/2018 1621   GLUCOSEU NEGATIVE 05/29/2018 1621   HGBUR TRACE (A) 05/29/2018 1621   BILIRUBINUR NEGATIVE 05/29/2018 1621   KETONESUR NEGATIVE 05/29/2018 1621   PROTEINUR NEGATIVE 05/29/2018 1621   NITRITE NEGATIVE 05/29/2018 1621   LEUKOCYTESUR NEGATIVE 05/29/2018 1621    Radiological Exams on Admission: Dg Chest 2 View  Result Date: 09/01/2018 CLINICAL DATA:  The patient fell today at a nursing home. History of chronic recurrent bronchitis, previous CVA, previous episodes of pneumonia. EXAM: CHEST - 2 VIEW COMPARISON:  Chest x-ray of June 24, 2018 FINDINGS: There is a large amount of volume loss on the left. The hemidiaphragm is elevated. Some pleural fluid is suspected. On the right the lung is well-expanded and clear. The left heart border is obscured. The pulmonary vascularity is not engorged. There's severe dextrocurvature of the thoracolumbar spine which is chronic. As best as can  be determined no acute rib fracture is present but visualization of the lower ribs is limited. IMPRESSION: New volume loss on the left with elevation of the left hemidiaphragm. Pleural fluid is present. One cannot exclude a central obstructing process resulting in atelectasis. No definite left ribcage fracture is observed. Underlying chronic bronchitic changes. Electronically Signed   By: David  Martinique M.D.   On: 09/09/2018 08:48   Ct Head Wo Contrast  Result Date: 08/30/2018 CLINICAL DATA:  Recent fall with head and neck pain, initial encounter EXAM: CT HEAD WITHOUT CONTRAST CT CERVICAL SPINE WITHOUT CONTRAST TECHNIQUE: Multidetector CT  imaging of the head and cervical spine was performed following the standard protocol without intravenous contrast. Multiplanar CT image reconstructions of the cervical spine were also generated. COMPARISON:  05/29/2018 FINDINGS: CT HEAD FINDINGS Brain: Diffuse atrophic changes are again identified and stable. Mild chronic white matter ischemic change is seen. No findings to suggest acute hemorrhage, acute infarction or space-occupying mass lesion are noted. Vascular: No hyperdense vessel or unexpected calcification. Skull: Normal. Negative for fracture or focal lesion. Sinuses/Orbits: No acute finding. Other: None. CT CERVICAL SPINE FINDINGS Alignment: Stable anterolisthesis of C7 on T1 is noted. Skull base and vertebrae: 7 cervical segments are well visualized. Persistent fusion at C2-3 likely of a congenital nature is noted. Anterolisthesis of C7 on T1 is again identified and stable. This is secondary to facet hypertrophic changes. Multilevel facet hypertrophic changes are seen. No acute fracture or acute facet abnormality is noted. Soft tissues and spinal canal: Surrounding soft tissue structures are within normal limits. Upper chest: Mild scarring in the right apex is noted stable from 2018. Other: None IMPRESSION: CT of the head: Chronic atrophic and ischemic changes  without acute abnormality. CT of the cervical spine: Degenerative changes with stable anterolisthesis of C7 on T1. 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 No acute abnormality noted. Electronically Signed   By: Inez Catalina M.D.   On: 08/22/2018 08:59   Ct Cervical Spine Wo Contrast  Result Date: 09/08/2018 CLINICAL DATA:  Recent fall with head and neck pain, initial encounter EXAM: CT HEAD WITHOUT CONTRAST CT CERVICAL SPINE WITHOUT CONTRAST TECHNIQUE: Multidetector CT imaging of the head and cervical spine was performed following the standard protocol without intravenous contrast. Multiplanar CT image reconstructions of the cervical spine were also generated. COMPARISON:  05/29/2018 FINDINGS: CT HEAD FINDINGS Brain: Diffuse atrophic changes are again identified and stable. Mild chronic white matter ischemic change is seen. No findings to suggest acute hemorrhage, acute infarction or space-occupying mass lesion are noted. Vascular: No hyperdense vessel or unexpected calcification. Skull: Normal. Negative for fracture or focal lesion. Sinuses/Orbits: No acute finding. Other: None. CT CERVICAL SPINE FINDINGS Alignment: Stable anterolisthesis of C7 on T1 is noted. Skull base and vertebrae: 7 cervical segments are well visualized. Persistent fusion at C2-3 likely of a congenital nature is noted. Anterolisthesis of C7 on T1 is again identified and stable. This is secondary to facet hypertrophic changes. Multilevel facet hypertrophic changes are seen. No acute fracture or acute facet abnormality is noted. Soft tissues and spinal canal: Surrounding soft tissue structures are within normal limits. Upper chest: Mild scarring in the right apex is noted stable from 2018. Other: None IMPRESSION: CT of the head: Chronic atrophic and ischemic changes without acute abnormality. CT of the cervical spine: Degenerative changes with stable anterolisthesis of C7 on T1. 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 No acute abnormality noted.  Electronically Signed   By: Inez Catalina M.D.   On: 09/14/2018 08:59   Ct Lumbar Spine Wo Contrast  Result Date: 08/22/2018 CLINICAL DATA:  [place].  Back pain. EXAM: CT LUMBAR SPINE WITHOUT CONTRAST TECHNIQUE: Multidetector CT imaging of the lumbar spine was performed without intravenous contrast administration. Multiplanar CT image reconstructions were also generated. COMPARISON:  CT scan 05/29/2018 FINDINGS: Segmentation: There are five lumbar type vertebral bodies. The last full intervertebral disc space is labeled L5-S1. Alignment: Severe left convex lumbar scoliosis estimated at 64 degrees. Associated severe degenerative lumbar spondylosis with multilevel disc disease and facet disease most significant at T11-12, L1-2 and L2-3. Advanced facet disease  but no definite pars defects. Vertebrae: No acute fracture. Stable osteoporosis. L1 Schmorl's node noted. The upper sacrum appears intact. The SI joints are intact. Paraspinal and other soft tissues: No significant findings. Disc levels: No obvious disc protrusions or spinal stenosis. IMPRESSION: Severe scoliosis and degenerative lumbar spondylosis. Osteoporosis. No obvious lumbar fractures. Electronically Signed   By: Marijo Sanes M.D.   On: 08/24/2018 09:00   Dg Hip Unilat W Or Wo Pelvis 2-3 Views Left  Result Date: 09/08/2018 CLINICAL DATA:  [place] today and nursing home. EXAM: DG HIP (WITH OR WITHOUT PELVIS) 2-3V LEFT COMPARISON:  Coronal and sagittal reconstructed images through the pelvis and hips from an abdominal and pelvic CT scan of May 29, 2018 FINDINGS: A large amount of gas and stool degrades the images of the pelvis. No definite acute pelvic fracture is observed. AP and lateral views of the left hip reveal moderate symmetric narrowing of the joint space. No acute fracture is observed. The femoral neck, intertrochanteric, and subtrochanteric regions are normal. IMPRESSION: No acute fracture or dislocation of the left hip is observed. There  is moderate osteoarthritic joint space loss. Visualization of the bony pelvis is limited. Electronically Signed   By: David  Martinique M.D.   On: 09/07/2018 08:47    EKG: Independently reviewed. NA  Assessment/Plan Active Problems:   Rhabdomyolysis  84 year old female who presents with muscle aches after sustaining a fall and stayed on the floor for about 11 hours.  She does have osteoarthritis with ambulatory dysfunction, there is no defined orthostatic symptoms but the fall occurred after she getting out of her bed.  On her initial physical examination blood pressure 93/63, heart rate 67, respiratory rate 16, oxygen saturation 97%, she has dry mucous membranes, positive pallor, lungs decreased breath sounds to auscultation bilaterally anterior, left basal hypoventilation., heart S1-S2 present rhythmic, abdomen soft nontender, no lower extremity edema significant osteoarthritic changes.  Sodium 131, potassium 3.7, chloride 92, bicarb 26, glucose 75, BUN 40, creatinine 0.54, total CPK 1822, white count 20.9, hemoglobin 14.5, hematocrit 44.8, platelets 233, urine analysis with 0-5 white cells, CT of the head, cervical and lumbar spine with no acute changes.  Significant chronic joint disease.  Chest radiograph with severe hiatal hernia with volume loss in the left hemithorax, atelectasis, small pleural effusion.  Left hip with no acute fracture.  Patient will be admitted to the hospital with a working diagnosis of impending rhabdomyolysis complicated by leukocytosis and left lower lobe atelectasis.  1.  Impending rhabdomyolysis.  Patient spent about 11 hours on the floor, her CPKs up to 1822, there is high risk of worsening due to severity of insult.  Will continue hydration with normal saline at 100 mL/h, follow-up CK in the morning, currently her creatinine is 0.54 but she does have significant decreased muscle mass which can underestimate true GFR.  Physical therapy evaluation.  2.  Left lower lobe  atelectasis.  New oximetry monitoring submental oxygen per nasal cannula, incentive spirometry and chest PT, follow-up chest radiograph in the morning.  Loss of lung volume likely related to fall and immobility for about 11 hours.  3.  Dyslipidemia.  On atorvastatin due to elevated CPKs.  4.  GERD.  Continue  Pantoprazole.  5.  Reactive leukocytosis.  No signs of systemic infection, hold on antibiotic therapy, check cell count in the morning.  DVT prophylaxis: enoxaparin  Code Status: full  Family Communication: no family at the bedside   Disposition Plan:  Med-surg.  Consults called: none  Admission status: Inpatient.     Terrill Alperin Gerome Apley MD [facility] Pager [phone]  If 7PM-7AM, please contact night-coverage [email or web address] Password [email or web address]  09/11/2018, 10:34 AM

## 2018-08-23 NOTE — ED Notes (Signed)
Hospitalist on phone with [facility], advised to intubate

## 2018-08-23 NOTE — ED Provider Notes (Signed)
[facility] DEPT Provider Note   CSN: [id number] Arrival date & time: 09/10/2018  0743     History   Chief Complaint Chief Complaint  Patient presents with  . Fall    HPI Jeanne Cruz is a 84 y.o. female.  Patient is a 84 year old female with history of anemia, prior CVA, arthritis.  She is a resident of [facility] assisted living.  She was sent here by EMS for evaluation of fall.  She states that she fell yesterday evening attempting to get into bed.  She was unable to get up off the floor and apparently laid there through the night.  She was able to yell for help this morning, then was brought here.  She complains of pain all throughout her body, however most notably in her neck and left hip.  It is unknown as to whether she hit her head or loss consciousness.  The history is provided by the patient and the EMS personnel.  Fall  This is a new problem. Episode onset: 10 PM last night. The problem occurs constantly. The problem has not changed since onset.Pertinent negatives include no shortness of breath. Nothing aggravates the symptoms. Nothing relieves the symptoms. She has tried nothing for the symptoms.    Past Medical History:  Diagnosis Date  . Anemia   . Anxiety   . Arthritis   . Blood transfusion without reported diagnosis   . Degenerative disc disease, cervical   . GI bleeding   . Iron deficiency anemia   . Neuropathy   . Postherpetic neuralgia   . Shingles   . Stroke [facility])     Patient Active Problem List   Diagnosis Date Noted  . Protein-calorie malnutrition, severe 03/17/2018  . Elevated troponin 03/15/2018  . Weakness generalized 03/15/2018  . Anorexia 03/15/2018  . CAP (community acquired pneumonia) 03/14/2018  . Hypotension 03/14/2018  . Erosive gastropathy 04/28/2017  . Ischemic stroke ([place]) 04/27/2017  . Hyponatremia 04/27/2017  . Normocytic anemia   . Acute blood loss anemia 04/17/2017  . GI bleed 04/16/2017    . GLAUCOMA 04/17/2008  . COLONIC POLYPS 04/12/2008  . CONSTIPATION 04/12/2008  . BRONCHITIS, RECURRENT 09/01/2007  . NECK AND BACK PAIN 09/01/2007  . Anxiety state 06/28/2007  . HIATAL HERNIA 06/28/2007  . DEGENERATIVE JOINT DISEASE 06/28/2007  . SCOLIOSIS 06/28/2007  . ANEMIA, HX OF 06/28/2007    Past Surgical History:  Procedure Laterality Date  . ABDOMINAL HYSTERECTOMY    . APPENDECTOMY    . BACK SURGERY    . ESOPHAGOGASTRODUODENOSCOPY N/A 04/17/2017   Procedure: ESOPHAGOGASTRODUODENOSCOPY (EGD);  Surgeon: Carol Ada, MD;  Location: [facility];  Service: Endoscopy;  Laterality: N/A;  . TONSILLECTOMY       OB History   None      Home Medications    Prior to Admission medications   Medication Sig Start Date End Date Taking? Authorizing Provider  albuterol (PROVENTIL HFA;VENTOLIN HFA) 108 (90 Base) MCG/ACT inhaler Inhale 1-2 puffs into the lungs every 6 (six) hours as needed for wheezing or shortness of breath.     [provider]  ALPRAZolam Duanne Moron) 0.5 MG tablet Take 1 tablet (0.5 mg total) by mouth 2 (two) times daily as needed for anxiety or sleep. 03/18/18   Charlynne Cousins, MD  ALPRAZolam Duanne Moron) 0.5 MG tablet TAKE 1 TABLET BY MOUTH TWICE A DAY 07/09/18   [provider]  aspirin EC 81 MG tablet Take 1 tablet by mouth daily. 03/21/18  [provider]  atorvastatin (LIPITOR) 20 MG tablet Take 1 tablet (20 mg total) by mouth daily at 6 PM. Patient not taking: Reported on 10/27/2017 04/28/17   Debbe Odea, MD  B Complex Vitamins (VITAMIN B COMPLEX IJ) Inject as directed every 30 (thirty) days.    [provider]  bimatoprost (LUMIGAN) 0.03 % ophthalmic solution Place 1 drop into both eyes at bedtime.     [provider]  COMBIGAN 0.2-0.5 % ophthalmic solution  06/21/18   [provider]  dicyclomine (BENTYL) 10 MG capsule Take 1 capsule by mouth 2 (two) times daily as needed. 05/20/18   [provider]   dorzolamide-timolol (COSOPT) 22.3-6.8 MG/ML ophthalmic solution Place 1 drop into both eyes 2 (two) times daily.     [provider]  gabapentin (NEURONTIN) 100 MG capsule Take 100 mg by mouth 3 (three) times daily.     [provider]  meloxicam (MOBIC) 7.5 MG tablet Take 7.5 mg by mouth daily. 06/03/18   [provider]  mirtazapine (REMERON) 15 MG tablet Take 1 tablet by mouth daily. 05/28/18   [provider]  Multiple Vitamin (MULTIVITAMIN WITH MINERALS) TABS tablet Take 1 tablet by mouth daily.    [provider]  nystatin (NYSTATIN) powder Apply 2 g topically 2 (two) times daily as needed (for irritation).    [provider]  pantoprazole (PROTONIX) 40 MG tablet Take 1 tablet (40 mg total) by mouth 2 (two) times daily. 04/17/17   Dessa Phi, DO  polyethylene glycol (MIRALAX / GLYCOLAX) packet Take 17 g by mouth daily.     [provider]  potassium chloride SA (K-DUR,KLOR-CON) 20 MEQ tablet Take 1 tablet (20 mEq total) by mouth 2 (two) times daily for 2 days. Patient not taking: Reported on 06/03/2018 10/27/17 10/29/17  Duffy Bruce, MD  traMADol (ULTRAM) 50 MG tablet Take 1 tablet (50 mg total) by mouth every 6 (six) hours as needed for moderate pain. 03/18/18   Charlynne Cousins, MD  Vitamin D, Ergocalciferol, (DRISDOL) 50000 units CAPS capsule Take 50,000 Units by mouth every 14 (fourteen) days.    [provider]    Family History Family History  Problem Relation Age of Onset  . Colon cancer Sister   . Heart attack Sister     Social History Social History   Tobacco Use  . Smoking status: Never Smoker  . Smokeless tobacco: Never Used  Substance Use Topics  . Alcohol use: No  . Drug use: No     Allergies   Ampicillin; Codeine; and Penicillins   Review of Systems Review of Systems  Respiratory: Negative for shortness of breath.   All other systems reviewed and are negative.    Physical  Exam Updated Vital Signs BP 126/77 (BP Location: Left Arm)   Temp 97.6 F (36.4 C) (Oral)   Resp 16   Ht 5\' 4"  (1.626 m)   Wt 38.6 kg   BMI 14.59 kg/m   Physical Exam  Constitutional: She is oriented to person, place, and time. She appears well-developed and well-nourished. No distress.  HENT:  Head: Normocephalic and atraumatic.  Neck: Normal range of motion. Neck supple.  Cardiovascular: Normal rate and regular rhythm. Exam reveals no gallop and no friction rub.  No murmur heard. Pulmonary/Chest: Effort normal and breath sounds normal. No respiratory distress. She has no wheezes.  Abdominal: Soft. Bowel sounds are normal. She exhibits no distension. There is no tenderness.  Musculoskeletal: Normal range of  motion.  There is tenderness to palpation over the lateral aspect of the left hip.  There is no obvious deformity.  She has some pain with range of motion.  Distal pulses, motor, and sensory are intact to both lower extremities.  Neurological: She is alert and oriented to person, place, and time. No cranial nerve deficit. She exhibits normal muscle tone. Coordination normal.  Skin: Skin is warm and dry. She is not diaphoretic.  Nursing note and vitals reviewed.    ED Treatments / Results  Labs (all labs ordered are listed, but only abnormal results are displayed) Labs Reviewed  BASIC METABOLIC PANEL  CBC WITH DIFFERENTIAL/PLATELET  CK    EKG None  Radiology No results found.  Procedures Procedures (including critical care time)  Medications Ordered in ED Medications  sodium chloride 0.9 % bolus 500 mL (has no administration in time range)     Initial Impression / Assessment and Plan / ED Course  I have reviewed the triage vital signs and the nursing notes.  Pertinent labs & imaging results that were available during my care of the patient were reviewed by me and considered in my medical decision making (see chart for details).  Patient brought by EMS after  being found on the floor at her assisted living facility.  She apparently fell yesterday evening at 10:00.  Imaging studies of her spine, head, and hips are all unremarkable.  She does have an elevated total CK of 1800 concerning for rhabdomyolysis.  Her electrolytes also reflect dehydration.  She will be given IV fluids and admitted to the hospitalist service under the care of Dr. Cathlean Sauer.  Final Clinical Impressions(s) / ED Diagnoses   Final diagnoses:  None    ED Discharge Orders    None       Veryl Speak, MD 08/22/2018 1105

## 2018-08-23 NOTE — Progress Notes (Signed)
Pt transported from ICU 1223 to CT on vent 100% fio2.  Pt tolerated transport well without incident. °

## 2018-08-23 NOTE — ED Triage Notes (Signed)
Pt via [facility] from [facility]. Pt fell and complains of head and pack pain Been on the ground since 2200 08/22/18 AO x 4 HX: Scoliosis Pt in towel roll on neck.

## 2018-08-23 NOTE — H&P (Signed)
PULMONARY / CRITICAL CARE MEDICINE   NAME:  Jeanne Cruz, MRN:  [id number], DOB:  07-07-1928, LOS: 0 ADMISSION DATE:  08/22/2018, CONSULTATION DATE:  08/29/2018 REFERRING MD:  Dr. Lysbeth Galas, CHIEF COMPLAINT:  Short of breath  HISTORY:    84 yo female fell at assisted living facility and was not found until 11 hours later.  She has progressive failure to thrive.  Found to have Lt sided pneumonia.  Developed respiratory failure and required intubation.  SIGNIFICANT PAST MEDICAL HISTORY   Severe scoliosis, Degenerative disk disease, hiatal hernia, Anemia, Anxiety, GI bleeding, Neuropathy, Postherpetic neuralgia, CVA  SIGNIFICANT EVENTS:  12/09 Admit by [facility], developed respiratory failure while in ER and intubated  STUDIES:   CT head/neck 12/09 >> atrophy, degenerative changes C7 to T1, congential fusion C2-3 CT lumbar spine 12/09 >> CT chest 12/10 >> severe scoliosis, osteoporosis  CULTURES:  Blood 12/09 >> Sputum 12/09 >>  ANTIBIOTICS:  Vancomycin 12/09 >> Cefepime 12/09 >>   LINES/TUBES:  ETT 12/09 >>   CONSULTANTS:   SUBJECTIVE:    CONSTITUTIONAL: BP (!) 133/59   Pulse (!) 150   Temp 100.2 F (37.9 C)   Resp (!) 22   Ht 5\' 4"  (1.626 m)   Wt 38.6 kg   SpO2 100%   BMI 14.59 kg/m   No intake/output data recorded.     Vent Mode: PRVC FiO2 (%):  [100 %] 100 % Set Rate:  [15 bmp] 15 bmp Vt Set:  [430 mL] 430 mL PEEP:  [5 cmH20] 5 cmH20 Plateau Pressure:  [21 cmH20] 21 cmH20  PHYSICAL EXAM:  General - sedated Eyes - pupils reactive ENT - ETT in place Cardiac - regular, tachycardic, no murmur Chest - decreased BS Lt base Abdomen - soft, non tender, decreased bowel sounds, no hepatosplenomegaly GU - no lesions noted Extremities - peripheral cyanosis Skin - no rashes Lymphatics - no lymphadenopathy Neuro - RASS -3  CXR 12/09 (reviewed by me) >> Lt side ASD and effusion  RESOLVED PROBLEM LIST   ASSESSMENT AND PLAN    Sepsis from pneumonia. -  continue IV fluids - f/u cultures - check procalcitonin - continue ABx - pressors to keep MAP > 65  Acute respiratory failure with hypoxia. Possible Lt pleural effusion. - full vent support - f/u CT chest  Sinus tachycardia. - continue IV fluids - pressors changed from levophed to phenylephrine - f/u TSH, cortisol  Severe protein calorie malnutrition with failure to thrive. - tube feeds  GERD with large hiatal hernia. - protonix   Best Practice / Goals of Care / Disposition.   DVT PROPHYLAXIS: SQ heparin SUP: Protonix NUTRITION: trickle tube feeds MOBILITY: bed rest GOALS OF CARE: full code FAMILY DISCUSSIONS: updated pt's adopted granddaughter at bedside.  Explained to her critical nature of current status, and that there is possibility Ms. Balaban might not survive this hospital stay.  Pt's friend, Hassan Rowan, is her POA.  Pt has two daughters and biologic granddaugther.  Will need to have continue discussions with family and POA depending on pt's progress. DISPOSITION: Admit to ICU  LABS  Glucose No results for input(s): GLUCAP in the last 168 hours.  BMET Recent Labs  Lab 08/30/2018 0921  NA 131*  K 3.7  CL 92*  CO2 26  BUN 40*  CREATININE 0.54  GLUCOSE 75    Liver Enzymes No results for input(s): AST, ALT, ALKPHOS, BILITOT, ALBUMIN in the last 168 hours.  Electrolytes Recent Labs  Lab 08/19/2018 0921  CALCIUM  10.2    CBC Recent Labs  Lab 08/18/18 1124 09/06/2018 0921  WBC 13.3* 20.9*  HGB 12.8 14.5  HCT 39.6 44.8  PLT 233 233    ABG No results for input(s): PHART, PCO2ART, PO2ART in the last 168 hours.  Coag's No results for input(s): APTT, INR in the last 168 hours.  Sepsis Markers No results for input(s): LATICACIDVEN, PROCALCITON, O2SATVEN in the last 168 hours.  Cardiac Enzymes No results for input(s): TROPONINI, PROBNP in the last 168 hours.  PAST MEDICAL HISTORY :   She  has a past medical history of Anemia, Anxiety, Arthritis, Blood  transfusion without reported diagnosis, Degenerative disc disease, cervical, GI bleeding, Iron deficiency anemia, Neuropathy, Postherpetic neuralgia, Shingles, and Stroke ([place]).  PAST SURGICAL HISTORY:  She  has a past surgical history that includes Abdominal hysterectomy; Appendectomy; Back surgery; Tonsillectomy; and Esophagogastroduodenoscopy (N/A, 04/17/2017).  Allergies  Allergen Reactions  . Ampicillin Diarrhea and Other (See Comments)    Has patient had a PCN reaction causing immediate rash, facial/tongue/throat swelling, SOB or lightheadedness with hypotension: No Has patient had a PCN reaction causing severe rash involving mucus membranes or skin necrosis: No Has patient had a PCN reaction that required hospitalization: No Has patient had a PCN reaction occurring within the last 10 years: No If all of the above answers are "NO", then may proceed with Cephalosporin use.  . Codeine Other (See Comments)    Reaction:  Somnolence   . Penicillins Diarrhea and Other (See Comments)    Has patient had a PCN reaction causing immediate rash, facial/tongue/throat swelling, SOB or lightheadedness with hypotension: No Has patient had a PCN reaction causing severe rash involving mucus membranes or skin necrosis: No Has patient had a PCN reaction that required hospitalization: No Has patient had a PCN reaction occurring within the last 10 years: No If all of the above answers are "NO", then may proceed with Cephalosporin use.    No current facility-administered medications on file prior to encounter.    Current Outpatient Medications on File Prior to Encounter  Medication Sig  . albuterol (PROVENTIL HFA;VENTOLIN HFA) 108 (90 Base) MCG/ACT inhaler Inhale 1-2 puffs into the lungs every 6 (six) hours as needed for wheezing or shortness of breath.   . ALPRAZolam (XANAX) 0.5 MG tablet Take 1 tablet (0.5 mg total) by mouth 2 (two) times daily as needed for anxiety or sleep.  Marland Kitchen aspirin EC 81 MG tablet  Take 1 tablet by mouth daily.  . B Complex Vitamins (VITAMIN B COMPLEX IJ) Inject 1 vial into the muscle every 30 (thirty) days.   . dorzolamide-timolol (COSOPT) 22.3-6.8 MG/ML ophthalmic solution Place 1 drop into both eyes 2 (two) times daily.   Marland Kitchen gabapentin (NEURONTIN) 100 MG capsule Take 100 mg by mouth 3 (three) times daily.   Marland Kitchen LUMIGAN 0.01 % SOLN Place 1 drop into both eyes daily.  . Multiple Vitamin (MULTIVITAMIN WITH MINERALS) TABS tablet Take 1 tablet by mouth daily.  Marland Kitchen nystatin (NYSTATIN) powder Apply 2 g topically 2 (two) times daily as needed (irritation).   . pantoprazole (PROTONIX) 40 MG tablet Take 1 tablet (40 mg total) by mouth 2 (two) times daily. (Patient taking differently: Take 40 mg by mouth daily. )  . polyethylene glycol (MIRALAX / GLYCOLAX) packet Take 17 g by mouth every other day.   . traMADol (ULTRAM) 50 MG tablet Take 1 tablet (50 mg total) by mouth every 6 (six) hours as needed for moderate pain. (Patient taking differently:  Take 50 mg by mouth every 8 (eight) hours as needed for moderate pain. )  . Vitamin D, Ergocalciferol, (DRISDOL) 50000 units CAPS capsule Take 50,000 Units by mouth every 14 (fourteen) days.  Marland Kitchen atorvastatin (LIPITOR) 20 MG tablet Take 1 tablet (20 mg total) by mouth daily at 6 PM. (Patient not taking: Reported on 10/27/2017)  . potassium chloride SA (K-DUR,KLOR-CON) 20 MEQ tablet Take 1 tablet (20 mEq total) by mouth 2 (two) times daily for 2 days. (Patient not taking: Reported on 09/09/2018)    FAMILY HISTORY:   Her family history includes Colon cancer in her sister; Heart attack in her sister.  SOCIAL HISTORY:  She  reports that she has never smoked. She has never used smokeless tobacco. She reports that she does not drink alcohol or use drugs.  REVIEW OF SYSTEMS:    Unable to obtain  CC time 60 minutes  Chesley Mires, MD [place] 09/06/2018, 8:49 PM

## 2018-08-23 NOTE — Progress Notes (Signed)
Chaplain paged because Pt unexpectedly when into respiratory distress. Granddaughter at bedside when chaplain arrived. More family is on the way.

## 2018-08-23 NOTE — ED Notes (Signed)
Pt to scan °

## 2018-08-23 NOTE — Clinical Social Work Note (Addendum)
Clinical Social Work Assessment  Patient Details  Name: Jeanne Cruz MRN: [id number] Date of Birth: Jun 08, 1928  Date of referral:  09/04/2018               Reason for consult:  Discharge Planning                Permission sought to share information with:  Family Supports Permission granted to share information::  Yes, Verbal Permission Granted  Name::     Emelda Fear (friend/POA [phone]) or Binnie Kand (granddaughter [phone])  Agency::     Relationship::     Contact Information:     Housing/Transportation Living arrangements for the past 2 months:  Independent Living Facility([facility]) Source of Information:  Patient Patient Interpreter Needed:  None Criminal Activity/Legal Involvement Pertinent to Current Situation/Hospitalization:  No - Comment as needed Significant Relationships:  Adult Children, Other Family Members, Friend Lives with:  Facility Resident Do you feel safe going back to the place where you live?  Yes Need for family participation in patient care:     Care giving concerns:  CSW consulted as patient is from [facility].   Social Worker assessment / plan:  CSW spoke with patient and patient's granddaughter, Elsie Stain, at bedside. Per patient, she is currently a resident at [facility] and has lived there for the last two years. Per patient, she fell last night and was not able to reach her intercom so that someone could come help her. Per patient, she does not have any concerns at this time with going back to [facility]. Patient states her 2 daughters and 2 granddaughters live here in [place] and are big supports for her. Patient also stated that her friend Pamala Hurry is her POA and helps take patient's to her appointments. Per patient and patient's granddaughter, Pamala Hurry handles all of patient's medical needs and patient has given verbal permission to reach out to [facility] with any questions. During assessment patient stated she was tired and  did not want to talk anymore. Patient denies social work needs at this time.   Employment status:  Retired Forensic scientist:  Medicare PT Recommendations:  Not assessed at this time Information / Referral to community resources:     Patient/Family's Response to care:  Patient appeared tired and in pain throughout assessment. Patient engaged but requested that assessment be cut short as she did not want to talk. Patient appreciative of CSW assistance and is agreeable to reach out to CSW with any concerns.   Patient/Family's Understanding of and Emotional Response to Diagnosis, Current Treatment, and Prognosis:  Patient understanding and agreeable to being admitted inpatient. Patient would like to return to [facility] at time of discharge.  Emotional Assessment Appearance:  Appears stated age Attitude/Demeanor/Rapport:    Affect (typically observed):  Appropriate, Calm, Accepting Orientation:  Oriented to Self, Oriented to Situation, Oriented to Place, Oriented to  Time Alcohol / Substance use:    Psych involvement (Current and /or in the community):     Discharge Needs  Concerns to be addressed:  Denies Needs/Concerns at this time Readmission within the last 30 days:  No Current discharge risk:  None Barriers to Discharge:  No Barriers Identified   Ollen Barges, [place] 09/12/2018, 2:15 PM

## 2018-08-24 ENCOUNTER — Inpatient Hospital Stay (HOSPITAL_COMMUNITY): Payer: Medicare Other

## 2018-08-24 DIAGNOSIS — R0902 Hypoxemia: Secondary | ICD-10-CM

## 2018-08-24 DIAGNOSIS — T796XXA Traumatic ischemia of muscle, initial encounter: Secondary | ICD-10-CM

## 2018-08-24 DIAGNOSIS — H401433 Capsular glaucoma with pseudoexfoliation of lens, bilateral, severe stage: Secondary | ICD-10-CM

## 2018-08-24 LAB — GLUCOSE, CAPILLARY
Glucose-Capillary: 143 mg/dL — ABNORMAL HIGH (ref 70–99)
Glucose-Capillary: 143 mg/dL — ABNORMAL HIGH (ref 70–99)
Glucose-Capillary: 149 mg/dL — ABNORMAL HIGH (ref 70–99)
Glucose-Capillary: 151 mg/dL — ABNORMAL HIGH (ref 70–99)
Glucose-Capillary: 172 mg/dL — ABNORMAL HIGH (ref 70–99)
Glucose-Capillary: 183 mg/dL — ABNORMAL HIGH (ref 70–99)
Glucose-Capillary: 204 mg/dL — ABNORMAL HIGH (ref 70–99)
Glucose-Capillary: 31 mg/dL — CL (ref 70–99)
Glucose-Capillary: 43 mg/dL — CL (ref 70–99)
Glucose-Capillary: 51 mg/dL — ABNORMAL LOW (ref 70–99)

## 2018-08-24 LAB — COMPREHENSIVE METABOLIC PANEL
ALT: 66 U/L — ABNORMAL HIGH (ref 0–44)
AST: 94 U/L — ABNORMAL HIGH (ref 15–41)
Albumin: 2 g/dL — ABNORMAL LOW (ref 3.5–5.0)
Alkaline Phosphatase: 84 U/L (ref 38–126)
Anion gap: 13 (ref 5–15)
BUN: 36 mg/dL — ABNORMAL HIGH (ref 8–23)
CO2: 20 mmol/L — ABNORMAL LOW (ref 22–32)
Calcium: 8.6 mg/dL — ABNORMAL LOW (ref 8.9–10.3)
Chloride: 104 mmol/L (ref 98–111)
Creatinine, Ser: 0.65 mg/dL (ref 0.44–1.00)
GFR calc non Af Amer: >60 mL/min (ref >60)
Glucose, Bld: 191 mg/dL — ABNORMAL HIGH (ref 70–99)
Potassium: 3.6 mmol/L (ref 3.5–5.1)
SODIUM: 137 mmol/L (ref 135–145)
Total Bilirubin: 1.3 mg/dL — ABNORMAL HIGH (ref 0.3–1.2)
Total Protein: 4.7 g/dL — ABNORMAL LOW (ref 6.5–8.1)

## 2018-08-24 LAB — PROCALCITONIN: Procalcitonin: 7.48 ng/mL

## 2018-08-24 LAB — MAGNESIUM: Magnesium: 1.8 mg/dL (ref 1.7–2.4)

## 2018-08-24 LAB — MRSA PCR SCREENING: MRSA BY PCR: POSITIVE — AB

## 2018-08-24 LAB — CK: Total CK: 294 U/L — ABNORMAL HIGH (ref 38–234)

## 2018-08-24 LAB — CBC
HCT: 36 % (ref 36.0–46.0)
Hemoglobin: 11.4 g/dL — ABNORMAL LOW (ref 12.0–15.0)
MCH: 29.7 pg (ref 26.0–34.0)
MCHC: 31.7 g/dL (ref 30.0–36.0)
MCV: 93.8 fL (ref 80.0–100.0)
Platelets: 261 10*3/uL (ref 150–400)
RBC: 3.84 MIL/uL — ABNORMAL LOW (ref 3.87–5.11)
RDW: 13.9 % (ref 11.5–15.5)
WBC: 20.8 10*3/uL — ABNORMAL HIGH (ref 4.0–10.5)
nRBC: 0.1 % (ref 0.0–0.2)

## 2018-08-24 LAB — STREP PNEUMONIAE URINARY ANTIGEN: Strep Pneumo Urinary Antigen: POSITIVE — AB

## 2018-08-24 LAB — PHOSPHORUS: Phosphorus: 2.6 mg/dL (ref 2.5–4.6)

## 2018-08-24 LAB — CORTISOL: Cortisol, Plasma: >100 ug/dL

## 2018-08-24 MED ORDER — FREE WATER
100.0000 mL | Freq: Four times a day (QID) | Status: DC
  Administered 2018-08-24 – 2018-08-25 (×4): 100 mL

## 2018-08-24 MED ORDER — ADULT MULTIVITAMIN LIQUID CH
15.0000 mL | Freq: Every day | ORAL | Status: DC
  Administered 2018-08-24 – 2018-08-25 (×2): 15 mL
  Filled 2018-08-24 (×2): qty 15

## 2018-08-24 MED ORDER — CHLORHEXIDINE GLUCONATE CLOTH 2 % EX PADS
6.0000 | MEDICATED_PAD | Freq: Every day | CUTANEOUS | Status: DC
  Administered 2018-08-24 – 2018-08-25 (×2): 6 via TOPICAL

## 2018-08-24 MED ORDER — OSMOLITE 1.2 CAL PO LIQD
1000.0000 mL | ORAL | Status: DC
  Administered 2018-08-24: 1000 mL

## 2018-08-24 MED ORDER — SODIUM CHLORIDE 0.9 % IV SOLN
500.0000 mg | INTRAVENOUS | Status: DC
  Administered 2018-08-24: 500 mg via INTRAVENOUS
  Filled 2018-08-24 (×2): qty 500

## 2018-08-24 MED ORDER — VITAL HIGH PROTEIN PO LIQD: 1000.0000 mL | ORAL | Status: DC

## 2018-08-24 MED ORDER — SODIUM CHLORIDE 0.9 % IV SOLN
2.0000 g | INTRAVENOUS | Status: DC
  Administered 2018-08-24 – 2018-08-25 (×2): 2 g via INTRAVENOUS
  Filled 2018-08-24 (×2): qty 2

## 2018-08-24 MED ORDER — SODIUM CHLORIDE 0.9 % IV SOLN
250.0000 mL | INTRAVENOUS | Status: DC
  Administered 2018-08-24: 250 mL via INTRAVENOUS

## 2018-08-24 MED ORDER — PRO-STAT SUGAR FREE PO LIQD
30.0000 mL | Freq: Every day | ORAL | Status: DC
  Administered 2018-08-24 – 2018-08-25 (×2): 30 mL
  Filled 2018-08-24 (×2): qty 30

## 2018-08-24 MED ORDER — ASPIRIN 81 MG PO CHEW
81.0000 mg | CHEWABLE_TABLET | Freq: Every day | ORAL | Status: DC
  Administered 2018-08-25: 81 mg
  Filled 2018-08-24: qty 1

## 2018-08-24 MED ORDER — DEXTROSE 50 % IV SOLN
25.0000 mL | Freq: Once | INTRAVENOUS | Status: AC
  Administered 2018-08-24: 25 mL via INTRAVENOUS

## 2018-08-24 MED ORDER — DEXTROSE 50 % IV SOLN
INTRAVENOUS | Status: AC
  Administered 2018-08-24: 25 mL via INTRAVENOUS
  Filled 2018-08-24: qty 50

## 2018-08-24 MED ORDER — MUPIROCIN 2 % EX OINT
1.0000 "application " | TOPICAL_OINTMENT | Freq: Two times a day (BID) | CUTANEOUS | Status: DC
  Administered 2018-08-24 – 2018-08-25 (×4): 1 via NASAL
  Filled 2018-08-24 (×2): qty 22

## 2018-08-24 MED ORDER — DEXTROSE 50 % IV SOLN
12.5000 g | Freq: Once | INTRAVENOUS | Status: AC
  Administered 2018-08-24: 12.5 g via INTRAVENOUS

## 2018-08-24 NOTE — Progress Notes (Signed)
Tracheal Aspirate obtained/labelled/sent to lab with Requisition, RN aware.

## 2018-08-24 NOTE — Progress Notes (Signed)
Replaced ETT holder- uneventful. ETT resides at 21cm lip, BBS, receiving full VT, Spo2 100%.

## 2018-08-24 NOTE — Care Management Note (Signed)
Case Management Note  Patient Details  Name: Jeanne Cruz MRN: [id number] Date of Birth: 1928-05-26  Subjective/Objective:                  full vent support  Action/Plan: Will follow for progression of care and clinical status. Will follow for case management needs none present at this time.  Expected Discharge Date:  (unknown)               Expected Discharge Plan:  Assisted Living / Rest Home  In-House Referral:  Clinical Social Work  Discharge planning Services  CM Consult  Post Acute Care Choice:    Choice offered to:     DME Arranged:    DME Agency:     HH Arranged:    HH Agency:     Status of Service:  In process, will continue to follow  If discussed at Long Length of Stay Meetings, dates discussed:    Additional Comments:  Leeroy Cha, RN 08/24/2018, 8:49 AM

## 2018-08-24 NOTE — Progress Notes (Signed)
Initial Nutrition Assessment  DOCUMENTATION CODES:   Severe malnutrition in context of chronic illness, Underweight  INTERVENTION:  - Will adjust TF regimen: Osmolite 1.2 @ 40 mL/hr with 30 mL Prostat once/day and 100 mL free water QID. This regimen will provide 1252 kcal (101% estimated need), 68 grams of protein, and 1187 mL free water.  - Will order liquid multivitamin per OGT/day.   NUTRITION DIAGNOSIS:   Severe Malnutrition related to chronic illness(prior CVA, arthritis) as evidenced by moderate fat depletion, severe fat depletion, moderate muscle depletion, severe muscle depletion.  GOAL:   Patient will meet greater than or equal to 90% of their needs  MONITOR:   Vent status, TF tolerance, Weight trends, Labs  REASON FOR ASSESSMENT:   Ventilator, Consult Enteral/tube feeding initiation and management  ASSESSMENT:   84 year-old female with history of anemia, prior CVA, and arthritis. She fell at ALF and was not found until 11 hours later. She has progressive failure to thrive and was found to have L-sided pneumonia with associated sepsis.  Developed respiratory failure and required intubation.  Patient is intubated with OGT in place. She is currently receiving Vital High Protein @ 20 mL/hr. Her granddaughter is at bedside. She reports that she visits the patient 2-3 times/week and does household chores and shopping for the patient. Patient has no problems chewing or swallowing but does experience early satiety and abdominal pain/discomfort with eating (solid foods worse than liquids) which granddaughter reports is d/t hernia and arthritis pressing on abdominal region. She does have Ensure supplements in her refrigerator at all times but it is unknown how many she may drink per day.   NFPE outlined below. Per chart review, weight has been fairly stable over the past 16 months. Granddaughter reports that patient moved back to [place] 2 years ago and since that time has  lost 3-8 lb. She states that patient has reported decreased decreased oral intake, black stools, and feeling progressively weaker.   Per Dr. Anastasia Pall note this AM: septic shock from PNA, goal MAP >65, L pleural effusion, large hiatal hernia, mild rhabdomyolysis.    Patient is currently intubated on ventilator support MV: 7.1 L/min Temp (24hrs), Avg:99.8 F (37.7 C), Min:96.3 F (35.7 C), Max:101.3 F (38.5 C) Propofol: none BP: 124/59 and MAP: 77  Medications reviewed; 1 packet Miralax every other day. Labs reviewed; CBGs: 31-204 mg/dL since 0017, BUN: 36 mg/dL, Ca: 8.6 mg/dL, LFTs elevated.  IVF; NS @ 75 mL/hr.  Drips; fentanyl @ 25 mcg/hr, neo @ 100 mcg/min.     NUTRITION - FOCUSED PHYSICAL EXAM:    Most Recent Value  Orbital Region  Mild depletion  Upper Arm Region  Moderate depletion  Thoracic and Lumbar Region  Unable to assess  Buccal Region  Moderate depletion  Temple Region  Severe depletion  Clavicle Bone Region  Moderate depletion  Clavicle and Acromion Bone Region  Severe depletion  Scapular Bone Region  Unable to assess  Dorsal Hand  Unable to assess [bilateral mitts]  Patellar Region  Severe depletion  Anterior Thigh Region  Severe depletion  Posterior Calf Region  Severe depletion  Edema (RD Assessment)  None  Hair  Reviewed  Eyes  Unable to assess  Mouth  Unable to assess  Skin  Reviewed  Nails  Unable to assess       Diet Order:   Diet Order            Diet NPO time specified  Diet effective now  EDUCATION NEEDS:   No education needs have been identified at this time  Skin:  Skin Assessment: Reviewed RN Assessment  Last BM:  PTA/unknown  Height:   Ht Readings from Last 1 Encounters:  09/10/2018 5\' 4"  (1.626 m)    Weight:   Wt Readings from Last 1 Encounters:  08/24/18 41.1 kg    Ideal Body Weight:  54.54 kg  BMI:  Body mass index is 15.55 kg/m.  Estimated Nutritional Needs:   Kcal:  1237  Protein:  62-70  grams (1.5-1.7 grams/kg)  Fluid:  >/= 1.5 L/day     Jarome Matin, MS, RD, LDN, [facility] Clinical Dietitian Pager # [phone] After hours/weekend pager # [phone]

## 2018-08-24 NOTE — Progress Notes (Signed)
PULMONARY / CRITICAL CARE MEDICINE   NAME:  Jeanne Cruz, MRN:  [id number], DOB:  November 11, 1927, LOS: 1 ADMISSION DATE:  08/26/2018, CONSULTATION DATE:  08/28/2018 REFERRING MD:  Dr. Lysbeth Galas, CHIEF COMPLAINT:  Short of breath  HISTORY:    84 yo female fell at assisted living facility and was not found until 11 hours later.  She has progressive failure to thrive.  Found to have Lt sided pneumonia.  Developed respiratory failure and required intubation.  SIGNIFICANT PAST MEDICAL HISTORY   Severe scoliosis, Degenerative disk disease, hiatal hernia, Anemia, Anxiety, GI bleeding, Neuropathy, Postherpetic neuralgia, CVA  SIGNIFICANT EVENTS:  12/09 Admit by [facility], developed respiratory failure while in ER and intubated  STUDIES:   CT head/neck 12/09 >> atrophy, degenerative changes C7 to T1, congential fusion C2-3 CT lumbar spine 12/09 >> CT chest 12/10 >> severe scoliosis, osteoporosis  CULTURES:  Blood 12/09 >> Sputum 12/09 >>  ANTIBIOTICS:  Vancomycin 12/09 >> 12/10 Cefepime 12/09 >> 12/10 Cetriaxone 12/10 >>  Azithro 12/10 >>   LINES/TUBES:  ETT 12/09 >>   CONSULTANTS:   SUBJECTIVE:    CONSTITUTIONAL: BP (!) 128/56   Pulse 66   Temp 99.1 F (37.3 C)   Resp 16   Ht _0  (1.626 m)   Wt 41.1 kg   SpO2 100%   BMI 15.55 kg/m   I/O last 3 completed shifts: In: 3307.6 [I.V.:1936.5; NG/GT:83.3; IV Piggyback:1287.8] Out: 700 [Urine:700]     Vent Mode: PRVC FiO2 (%):  [30 %-100 %] 30 % Set Rate:  [15 bmp-16 bmp] 16 bmp Vt Set:  [430 mL] 430 mL PEEP:  [5 cmH20] 5 cmH20 Plateau Pressure:  [20 GSU11-03 cmH20] 20 cmH20  PHYSICAL EXAM:  General:  In bed on vent HENT: NCAT ETT in place PULM: CTA B, vent supported breathing CV: RRR, no mgr GI: BS+, soft, nontender MSK: normal bulk and tone Neuro: sedated on vent    CXR 12/09 (reviewed by me) >> Lt side ASD and effusion  RESOLVED PROBLEM LIST   ASSESSMENT AND PLAN    Septic shock from pneumonia; BP  complicated by sedation; - wean off neosynephrine for MAP > 65 - continue IV fluids - stop cefepime/vanc - start ceftriaxone/azithro - f/u cultures  Acute respiratory failure with hypoxia. Parapneumonic left pleural effusion - discussed with family, they would prefer to forgo an intubation - continue full vent support - daily SBT/WUA - VAP prevention protocol  Sinus tachycardia> resolved - continue IV fluids  Severe protein calorie malnutrition with failure to thrive. - continue tube feeding  GERD with large hiatal hernia. - Pantoprazole for stress ulcer prophylaxis  Mild rhabdomyolysis: CK improving - continue IV fluids  Best Practice / Goals of Care / Disposition.   DVT PROPHYLAXIS: SQ heparin SUP: Protonix NUTRITION: regular tube feeding MOBILITY: bed rest GOALS OF CARE: full code FAMILY DISCUSSIONS: I met with the patient's friend Hassan Rowan who holds POA and with her granddaughter.  They stated that she has been declining over several months and stated that she is "ready to die".  They do not want prolonged mechanical ventilation.  They do not want CPR or electric shocks in the event of a cardiac arrest.  I explained we would continue supportive care but would not perform more procedures over the next few days.  If she is not off the ventilator by 12/12 then we will need to withdraw care.   DISPOSITION: Admit to ICU  LABS  Glucose Recent Labs  Lab 08/24/18 0017  08/24/18 0044 08/24/18 0113 08/24/18 0116 08/24/18 0329 08/24/18 0847  GLUCAP 43* 51* 31* 204* 172* 143*    BMET Recent Labs  Lab 08/21/2018 0921 08/24/18 0246  NA 131* 137  K 3.7 3.6  CL 92* 104  CO2 26 20*  BUN 40* 36*  CREATININE 0.54 0.65  GLUCOSE 75 191*    Liver Enzymes Recent Labs  Lab 08/24/18 0246  AST 94*  ALT 66*  ALKPHOS 84  BILITOT 1.3*  ALBUMIN 2.0*    Electrolytes Recent Labs  Lab 09/07/2018 0921 08/24/18 0246  CALCIUM 10.2 8.6*  MG  --  1.8  PHOS  --  2.6     CBC Recent Labs  Lab 08/18/18 1124 08/18/2018 0921 08/24/18 0246  WBC 13.3* 20.9* 20.8*  HGB 12.8 14.5 11.4*  HCT 39.6 44.8 36.0  PLT 233 233 261    ABG Recent Labs  Lab 09/02/2018 2235  PHART 7.354  PCO2ART 38.8  PO2ART 99.1    Coag's No results for input(s): APTT, INR in the last 168 hours.  Sepsis Markers Recent Labs  Lab 08/19/2018 2050 08/24/18 0246  PROCALCITON 5.46 7.48    Cardiac Enzymes No results for input(s): TROPONINI, PROBNP in the last 168 hours.    CC time 35 minutes  Roselie Awkward, MD [place] [facility] Pager: [phone] Cell: [phone] If no response, call [phone]  08/24/2018, 10:20 AM

## 2018-08-24 NOTE — Progress Notes (Signed)
CRITICAL VALUE ALERT  Critical Value:  CBG 43  Date & Time Noted:  12/10 0020  Provider Notified: [facility]-Link notified  Orders Received/Actions taken: 12.5 g Dextrose given per protocol. Follow up CBG 51; 12.5 g Dextrose given. CBG 31 at 0113 via fingerstick. Due to poor peripheral perfusion, blood sugar was re-checked by drawing back blood from IV and found to be 204. E-Link MD notified and orders given to continue to collect blood glucose from IV site.  Will continue to monitor.

## 2018-08-25 ENCOUNTER — Inpatient Hospital Stay (HOSPITAL_COMMUNITY): Payer: Medicare Other

## 2018-08-25 ENCOUNTER — Telehealth: Payer: Self-pay | Admitting: Internal Medicine

## 2018-08-25 LAB — CBC WITH DIFFERENTIAL/PLATELET
Abs Immature Granulocytes: 0.3 10*3/uL — ABNORMAL HIGH (ref 0.00–0.07)
BASOS PCT: 0 %
Basophils Absolute: 0.1 10*3/uL (ref 0.0–0.1)
Eosinophils Absolute: 0 10*3/uL (ref 0.0–0.5)
Eosinophils Relative: 0 %
HCT: 35.3 % — ABNORMAL LOW (ref 36.0–46.0)
Hemoglobin: 11.2 g/dL — ABNORMAL LOW (ref 12.0–15.0)
Immature Granulocytes: 1 %
Lymphocytes Relative: 3 %
Lymphs Abs: 0.6 10*3/uL — ABNORMAL LOW (ref 0.7–4.0)
MCH: 29.7 pg (ref 26.0–34.0)
MCHC: 31.7 g/dL (ref 30.0–36.0)
MCV: 93.6 fL (ref 80.0–100.0)
Monocytes Absolute: 0.9 10*3/uL (ref 0.1–1.0)
Monocytes Relative: 4 %
NEUTROS PCT: 92 %
Neutro Abs: 19 10*3/uL — ABNORMAL HIGH (ref 1.7–7.7)
Platelets: 221 10*3/uL (ref 150–400)
RBC: 3.77 MIL/uL — ABNORMAL LOW (ref 3.87–5.11)
RDW: 14.6 % (ref 11.5–15.5)
WBC: 20.8 10*3/uL — ABNORMAL HIGH (ref 4.0–10.5)
nRBC: 0 % (ref 0.0–0.2)

## 2018-08-25 LAB — GLUCOSE, CAPILLARY
Glucose-Capillary: 137 mg/dL — ABNORMAL HIGH (ref 70–99)
Glucose-Capillary: 135 mg/dL — ABNORMAL HIGH (ref 70–99)
Glucose-Capillary: 146 mg/dL — ABNORMAL HIGH (ref 70–99)

## 2018-08-25 LAB — PROCALCITONIN: PROCALCITONIN: 4.86 ng/mL

## 2018-08-25 LAB — BASIC METABOLIC PANEL
Anion gap: 7 (ref 5–15)
BUN: 27 mg/dL — ABNORMAL HIGH (ref 8–23)
CO2: 22 mmol/L (ref 22–32)
Calcium: 8.4 mg/dL — ABNORMAL LOW (ref 8.9–10.3)
Chloride: 115 mmol/L — ABNORMAL HIGH (ref 98–111)
Creatinine, Ser: 0.41 mg/dL — ABNORMAL LOW (ref 0.44–1.00)
GFR calc Af Amer: >60 mL/min (ref >60)
GFR calc non Af Amer: >60 mL/min (ref >60)
Glucose, Bld: 144 mg/dL — ABNORMAL HIGH (ref 70–99)
Potassium: 3 mmol/L — ABNORMAL LOW (ref 3.5–5.1)
Sodium: 144 mmol/L (ref 135–145)

## 2018-08-25 MED ORDER — GLYCOPYRROLATE 0.2 MG/ML IJ SOLN: 0.2000 mg | INTRAMUSCULAR | Status: DC | PRN

## 2018-08-25 MED ORDER — MIDAZOLAM HCL 2 MG/2ML IJ SOLN
2.0000 mg | INTRAMUSCULAR | Status: DC | PRN
  Administered 2018-08-25: 2 mg via INTRAVENOUS
  Filled 2018-08-25: qty 2

## 2018-08-25 MED ORDER — POTASSIUM CHLORIDE 20 MEQ/15ML (10%) PO SOLN: 40.0000 meq | Freq: Once | ORAL | Status: DC

## 2018-08-25 MED ORDER — ARTIFICIAL TEARS OPHTHALMIC OINT
TOPICAL_OINTMENT | OPHTHALMIC | Status: DC | PRN
  Filled 2018-08-25: qty 3.5

## 2018-08-25 MED ORDER — ACETAMINOPHEN 325 MG PO TABS: 650.0000 mg | ORAL_TABLET | Freq: Four times a day (QID) | ORAL | Status: DC | PRN

## 2018-08-25 MED ORDER — POLYETHYLENE GLYCOL 3350 17 G PO PACK
17.0000 g | PACK | Freq: Every day | ORAL | Status: DC
  Administered 2018-08-25: 17 g via ORAL
  Filled 2018-08-25: qty 1

## 2018-08-25 MED ORDER — FENTANYL CITRATE (PF) 100 MCG/2ML IJ SOLN: 50.0000 ug | INTRAMUSCULAR | Status: DC | PRN

## 2018-08-25 MED ORDER — FENTANYL 2500MCG IN NS 250ML (10MCG/ML) PREMIX INFUSION
0.0000 ug/h | INTRAVENOUS | Status: DC
  Filled 2018-08-25: qty 250

## 2018-08-25 MED ORDER — GLYCOPYRROLATE 1 MG PO TABS: 1.0000 mg | ORAL_TABLET | ORAL | Status: DC | PRN

## 2018-08-25 MED ORDER — DIPHENHYDRAMINE HCL 50 MG/ML IJ SOLN: 25.0000 mg | INTRAMUSCULAR | Status: DC | PRN

## 2018-08-25 MED ORDER — POLYVINYL ALCOHOL 1.4 % OP SOLN
1.0000 [drp] | Freq: Four times a day (QID) | OPHTHALMIC | Status: DC | PRN
  Filled 2018-08-25: qty 15

## 2018-08-25 MED ORDER — FENTANYL BOLUS VIA INFUSION
100.0000 ug | INTRAVENOUS | Status: DC | PRN
  Administered 2018-08-25 (×3): 100 ug via INTRAVENOUS
  Filled 2018-08-25: qty 100

## 2018-08-25 MED ORDER — BISACODYL 10 MG RE SUPP: 10.0000 mg | Freq: Every day | RECTAL | Status: DC | PRN

## 2018-08-25 MED ORDER — DEXTROSE 5 % IV SOLN: INTRAVENOUS | Status: DC

## 2018-08-25 MED ORDER — SENNOSIDES 8.8 MG/5ML PO SYRP
5.0000 mL | ORAL_SOLUTION | Freq: Every evening | ORAL | Status: DC | PRN
  Filled 2018-08-25: qty 5

## 2018-08-25 MED ORDER — ACETAMINOPHEN 650 MG RE SUPP: 650.0000 mg | Freq: Four times a day (QID) | RECTAL | Status: DC | PRN

## 2018-08-26 LAB — CULTURE, RESPIRATORY W GRAM STAIN

## 2018-08-27 ENCOUNTER — Inpatient Hospital Stay: Payer: Medicare Other

## 2018-08-31 LAB — CULTURE, BLOOD (ROUTINE X 2)
Culture: NO GROWTH
Culture: NO GROWTH
Special Requests: ADEQUATE

## 2018-09-02 ENCOUNTER — Ambulatory Visit: Payer: Medicare Other | Admitting: Internal Medicine

## 2018-09-02 ENCOUNTER — Other Ambulatory Visit: Payer: Medicare Other

## 2018-09-15 NOTE — Procedures (Signed)
Extubation Procedure Note  Patient Details:   Name: Jeanne Cruz DOB: Oct 07, 1927 MRN: [id number]   Airway Documentation:  Airway 7 mm (Active)  Secured at (cm) 21 cm 09/22/18 12:00 PM  Measured From Lips 2018-09-22 12:00 PM  Secured Location Left 09/22/2018 12:00 PM  Secured By [employer] 09/22/18 12:00 PM  Tube Holder Repositioned Yes 2018/09/22 11:39 AM  Cuff Pressure (cm H2O) 22 cm H2O Sep 22, 2018  7:40 AM  Site Condition Dry 2018-09-22 11:39 AM   Vent end date: (not recorded) Vent end time: (not recorded)   Evaluation  O2 sats: stable throughout Complications: No apparent complications Patient did tolerate procedure well. Bilateral Breath Sounds: Diminished, Rhonchi   Yes  Elsie Stain September 22, 2018, 3:58 PM

## 2018-09-15 NOTE — Progress Notes (Signed)
PULMONARY / CRITICAL CARE MEDICINE   NAME:  Jeanne Cruz, MRN:  [id number], DOB:  [birth year], LOS: 2 ADMISSION DATE:  09/11/2018, CONSULTATION DATE:  09/11/2018 REFERRING MD:  Dr. Lysbeth Galas, CHIEF COMPLAINT:  Short of breath  HISTORY:    85 yo female fell at assisted living facility and was not found until 11 hours later.  She has progressive failure to thrive.  Found to have Lt sided pneumonia.  Developed respiratory failure and required intubation.  SIGNIFICANT PAST MEDICAL HISTORY   Severe scoliosis, Degenerative disk disease, hiatal hernia, Anemia, Anxiety, GI bleeding, Neuropathy, Postherpetic neuralgia, CVA  SIGNIFICANT EVENTS:  12/09 Admit by [facility], developed respiratory failure while in ER and intubated  STUDIES:   CT head/neck 12/09 >> atrophy, degenerative changes C7 to T1, congential fusion C2-3 CT lumbar spine 12/09 >> CT chest 12/10 >> severe scoliosis, osteoporosis  CULTURES:  Blood 12/09 >> Sputum 12/09 >> GPC in pairs  ANTIBIOTICS:  Vancomycin 12/09 >> 12/10 Cefepime 12/09 >> 12/10 Cetriaxone 12/10 >>  Azithro 12/10 >>   LINES/TUBES:  ETT 12/09 >>   CONSULTANTS:   SUBJECTIVE:  Required suctioning last night CXR worse Hypokalemic Remains on vasopressors Low grade temperature  CONSTITUTIONAL: BP (!) 113/55   Pulse 61   Temp (!) 100.4 F (38 C)   Resp 16   Ht 5\' 4"  (1.626 m)   Wt 43.4 kg   SpO2 100%   BMI 16.42 kg/m   I/O last 3 completed shifts: In: 72536 [I.V.:8456.5; NG/GT:283.3; IV Piggyback:1604.2] Out: 700 [Urine:700]     Vent Mode: PRVC FiO2 (%):  [30 %] 30 % Set Rate:  [16 bmp] 16 bmp Vt Set:  [430 mL] 430 mL PEEP:  [5 cmH20] 5 cmH20 Plateau Pressure:  [20 cmH20-26 cmH20] 26 cmH20  PHYSICAL EXAM:  General:  Cachectic, in bed on vent HENT: NCAT ETT in place PULM: Diminished on left B, vent supported breathing CV: RRR, no mgr GI: BS+, soft, nontender MSK: normal bulk and tone Neuro: sedated on vent    RESOLVED PROBLEM  LIST   ASSESSMENT AND PLAN    Septic shock from pneumonia; BP complicated by sedation; - continue to try to wean off neosynephrine for MAP > 65 - continue IV fluids - continue ceftriaxone and azithro - minimize sedation   Acute respiratory failure with hypoxia Parapneumonic left pleural effusion - family does not want her to have a thoracentesis - continuing full vent support for now - SBT/WUA today - repeat CXR in AM - VAP prevention protocol -  Sinus tachycardia> resolved - continue IV fluids  Severe protein calorie malnutrition with failure to thrive. - continue tube feeding  GERD with large hiatal hernia. - Pantoprazole for stress ulcer prophylaxis  Mild rhabdomyolysis: CK improving, UOP adequate - continue IV fluids  Constipation - bowel regimen: start miralax, dulcolax, senna  Goals of Care - family wanting to withdraw care, they are trying to get family members here who are in prison  Best Practice / Goals of Care / Disposition.   DVT PROPHYLAXIS: SQ heparin SUP: Protonix NUTRITION: regular tube feeding MOBILITY: bed rest GOALS OF CARE: full code FAMILY DISCUSSIONS: see discussion from 12/11 note DISPOSITION: Maintain in ICU  LABS  Glucose Recent Labs  Lab 08/24/18 1239 08/24/18 1742 08/24/18 2036 08/24/18 2325 09-14-18 0338 2018/09/14 0731  GLUCAP 149* 183* 143* 151* 137* 135*    BMET Recent Labs  Lab 08/26/2018 0921 08/24/18 0246 09/14/18 0249  NA 131* 137 144  K 3.7 3.6  3.0*  CL 92* 104 115*  CO2 26 20* 22  BUN 40* 36* 27*  CREATININE 0.54 0.65 0.41*  GLUCOSE 75 191* 144*    Liver Enzymes Recent Labs  Lab 08/24/18 0246  AST 94*  ALT 66*  ALKPHOS 84  BILITOT 1.3*  ALBUMIN 2.0*    Electrolytes Recent Labs  Lab 09/14/2018 0921 08/24/18 0246 2018/09/11 0249  CALCIUM 10.2 8.6* 8.4*  MG  --  1.8  --   PHOS  --  2.6  --     CBC Recent Labs  Lab 09/13/2018 0921 08/24/18 0246 09/11/18 0249  WBC 20.9* 20.8* 20.8*  HGB 14.5  11.4* 11.2*  HCT 44.8 36.0 35.3*  PLT 233 261 221    ABG Recent Labs  Lab 08/27/2018 2235  PHART 7.354  PCO2ART 38.8  PO2ART 99.1    Coag's No results for input(s): APTT, INR in the last 168 hours.  Sepsis Markers Recent Labs  Lab 08/20/2018 2050 08/24/18 0246 09-11-2018 0249  PROCALCITON 5.46 7.48 4.86    Cardiac Enzymes No results for input(s): TROPONINI, PROBNP in the last 168 hours.    CC time 40 minutes  Roselie Awkward, MD [place] [facility] Pager: [phone] Cell: [phone] If no response, call [phone]  09/11/2018, 9:07 AM

## 2018-09-15 NOTE — Telephone Encounter (Signed)
Received a call from the pt's friend, Jeanne Cruz, to cancel Jeanne Cruz's appt on 12/13. States that the pt is currently in the hospital.

## 2018-09-15 NOTE — Progress Notes (Signed)
Pt terminally extubated to 2L [place] Pt's SAT 16%, HR 57, and BP decrease.  Pt extubated per doctor

## 2018-09-15 NOTE — Death Summary Note (Signed)
DEATH SUMMARY   Patient Details  Name: Jeanne Cruz MRN: [id number] DOB: 24-Jan-1928  Admission/Discharge Information   Admit Date:  September 02, 2018  Date of Death: Date of Death: 09-04-18  Time of Death: Time of Death: 1620/12/18  Length of Stay: 2  Referring Physician: Center, Gordon   Reason(s) for Hospitalization  Pneumonia  Diagnoses  Preliminary cause of death:   Severe community acquired pneumonia Secondary Diagnoses (including complications and co-morbidities):  Active Problems:   Rhabdomyolysis   Hypoxemia   Sepsis [facility]) Septic shock  Brief Hospital Course (including significant findings, care, treatment, and services provided and events leading to death)  Jeanne Cruz is a 85 y.o. year old female who was admitted with severe community acquired pneumonia who had acute respiratory failure with hypoxemia requiring intubation.  She had been in declining health for several months.  She was found down prior to admission and had rhabdomyolysis.  The family indicated that they did not want aggressive care and requested that we withdraw care and make her comfortable.  She died peacefully with family at the bedside.     Pertinent Labs and Studies  Significant Diagnostic Studies Dg Chest 1 View  Result Date: 08/24/2018 CLINICAL DATA:  Respiratory failure. EXAM: CHEST  1 VIEW COMPARISON:  Radiograph and CT scan of 02-Sep-2018. FINDINGS: Endotracheal is unchanged in position. Distal tip of nasogastric tube is looped in distal esophagus. Stable cardiomegaly. No pneumothorax is noted. Right lung is clear. Stable moderate left pleural effusion is noted with associated atelectasis or infiltrate. Bony thorax is unremarkable. IMPRESSION: Stable moderate left pleural effusion with adjacent atelectasis or infiltrate. Distal tip of nasogastric tube looped in distal esophagus. Electronically Signed   By: Marijo Conception, M.D.   On: 08/24/2018 07:37   Dg Chest 2 View  Result Date:  2018-09-02 CLINICAL DATA:  The patient fell today at a nursing home. History of chronic recurrent bronchitis, previous CVA, previous episodes of pneumonia. EXAM: CHEST - 2 VIEW COMPARISON:  Chest x-ray of June 24, 2018 FINDINGS: There is a large amount of volume loss on the left. The hemidiaphragm is elevated. Some pleural fluid is suspected. On the right the lung is well-expanded and clear. The left heart border is obscured. The pulmonary vascularity is not engorged. There's severe dextrocurvature of the thoracolumbar spine which is chronic. As best as can be determined no acute rib fracture is present but visualization of the lower ribs is limited. IMPRESSION: New volume loss on the left with elevation of the left hemidiaphragm. Pleural fluid is present. One cannot exclude a central obstructing process resulting in atelectasis. No definite left ribcage fracture is observed. Underlying chronic bronchitic changes. Electronically Signed   By: David  Martinique M.D.   On: 2018/09/02 08:48   Ct Head Wo Contrast  Result Date: Sep 02, 2018 CLINICAL DATA:  Recent fall with head and neck pain, initial encounter EXAM: CT HEAD WITHOUT CONTRAST CT CERVICAL SPINE WITHOUT CONTRAST TECHNIQUE: Multidetector CT imaging of the head and cervical spine was performed following the standard protocol without intravenous contrast. Multiplanar CT image reconstructions of the cervical spine were also generated. COMPARISON:  05/29/2018 FINDINGS: CT HEAD FINDINGS Brain: Diffuse atrophic changes are again identified and stable. Mild chronic white matter ischemic change is seen. No findings to suggest acute hemorrhage, acute infarction or space-occupying mass lesion are noted. Vascular: No hyperdense vessel or unexpected calcification. Skull: Normal. Negative for fracture or focal lesion. Sinuses/Orbits: No acute finding. Other: None. CT CERVICAL SPINE FINDINGS  Alignment: Stable anterolisthesis of C7 on T1 is noted. Skull base and vertebrae:  7 cervical segments are well visualized. Persistent fusion at C2-3 likely of a congenital nature is noted. Anterolisthesis of C7 on T1 is again identified and stable. This is secondary to facet hypertrophic changes. Multilevel facet hypertrophic changes are seen. No acute fracture or acute facet abnormality is noted. Soft tissues and spinal canal: Surrounding soft tissue structures are within normal limits. Upper chest: Mild scarring in the right apex is noted stable from 2018. Other: None IMPRESSION: CT of the head: Chronic atrophic and ischemic changes without acute abnormality. CT of the cervical spine: Degenerative changes with stable anterolisthesis of C7 on T1. 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 No acute abnormality noted. Electronically Signed   By: Inez Catalina M.D.   On: 09/06/2018 08:59   Ct Chest Wo Contrast  Result Date: 08/17/2018 CLINICAL DATA:  Found on the floor at an assisted living facility. EXAM: CT CHEST WITHOUT CONTRAST TECHNIQUE: Multidetector CT imaging of the chest was performed following the standard protocol without IV contrast. COMPARISON:  Portable chest radiograph obtained earlier today. FINDINGS: Cardiovascular: Mild atheromatous arterial calcifications, including the thoracic aorta. Normal sized heart. Mediastinum/Nodes: Moderate to large-sized hiatal hernia. Endotracheal tube tip approximately 2.5 cm above the carina. Nasogastric tube extending into the hiatal hernia with the tip in the hernia. No enlarged lymph nodes. Bilateral thyroid nodules. The largest measures 1.2 cm in the right lobe on image number 16 series 2. Lungs/Pleura: Large area of very dense consolidation in the left upper lobe with air bronchograms. No central bronchial obstruction seen. Small to moderate-sized left pleural effusion with mild adjacent left lower lobe compressive atelectasis. Multiple small, subcentimeter nodules in the right lung, involving all 3 lobes. The largest is in the right lower lobe,  with a maximum diameter of 6 mm on image number 85 series 5. Upper Abdomen: Unremarkable. Musculoskeletal: Marked dextroconvex thoracic scoliosis. Thoracic and lower cervical spine degenerative changes. These include grade 1-2 anterolisthesis at the C7-T1 level. IMPRESSION: 1. Large area of dense pneumonia in the left upper lobe. 2. Small to moderate-sized left pleural effusion with mild adjacent left lower lobe compressive atelectasis. 3. Multiple small, subcentimeter nodules in the right lung, involving all 3 lobes. These could be infectious or neoplastic in nature, including possible metastases. 4. Moderate to large-sized hiatal hernia containing the distal portion of the nasogastric tube. 5. Bilateral thyroid nodules, the largest measuring 1.2 cm on the right. Consider further evaluation with thyroid ultrasound. If patient is clinically hyperthyroid, consider nuclear medicine thyroid uptake and scan. Aortic Atherosclerosis (ICD10-I70.0). Electronically Signed   By: Claudie Revering M.D.   On: 09/08/2018 23:30   Ct Cervical Spine Wo Contrast  Result Date: 09/14/2018 CLINICAL DATA:  Recent fall with head and neck pain, initial encounter EXAM: CT HEAD WITHOUT CONTRAST CT CERVICAL SPINE WITHOUT CONTRAST TECHNIQUE: Multidetector CT imaging of the head and cervical spine was performed following the standard protocol without intravenous contrast. Multiplanar CT image reconstructions of the cervical spine were also generated. COMPARISON:  05/29/2018 FINDINGS: CT HEAD FINDINGS Brain: Diffuse atrophic changes are again identified and stable. Mild chronic white matter ischemic change is seen. No findings to suggest acute hemorrhage, acute infarction or space-occupying mass lesion are noted. Vascular: No hyperdense vessel or unexpected calcification. Skull: Normal. Negative for fracture or focal lesion. Sinuses/Orbits: No acute finding. Other: None. CT CERVICAL SPINE FINDINGS Alignment: Stable anterolisthesis of C7 on T1 is  noted. Skull base and vertebrae:  7 cervical segments are well visualized. Persistent fusion at C2-3 likely of a congenital nature is noted. Anterolisthesis of C7 on T1 is again identified and stable. This is secondary to facet hypertrophic changes. Multilevel facet hypertrophic changes are seen. No acute fracture or acute facet abnormality is noted. Soft tissues and spinal canal: Surrounding soft tissue structures are within normal limits. Upper chest: Mild scarring in the right apex is noted stable from 2018. Other: None IMPRESSION: CT of the head: Chronic atrophic and ischemic changes without acute abnormality. CT of the cervical spine: Degenerative changes with stable anterolisthesis of C7 on T1. 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 No acute abnormality noted. Electronically Signed   By: Inez Catalina M.D.   On: 08/30/2018 08:59   Ct Lumbar Spine Wo Contrast  Result Date: 08/29/2018 CLINICAL DATA:  [facility].  Back pain. EXAM: CT LUMBAR SPINE WITHOUT CONTRAST TECHNIQUE: Multidetector CT imaging of the lumbar spine was performed without intravenous contrast administration. Multiplanar CT image reconstructions were also generated. COMPARISON:  CT scan 05/29/2018 FINDINGS: Segmentation: There are five lumbar type vertebral bodies. The last full intervertebral disc space is labeled L5-S1. Alignment: Severe left convex lumbar scoliosis estimated at 64 degrees. Associated severe degenerative lumbar spondylosis with multilevel disc disease and facet disease most significant at T11-12, L1-2 and L2-3. Advanced facet disease but no definite pars defects. Vertebrae: No acute fracture. Stable osteoporosis. L1 Schmorl's node noted. The upper sacrum appears intact. The SI joints are intact. Paraspinal and other soft tissues: No significant findings. Disc levels: No obvious disc protrusions or spinal stenosis. IMPRESSION: Severe scoliosis and degenerative lumbar spondylosis. Osteoporosis. No obvious lumbar fractures.  Electronically Signed   By: Marijo Sanes M.D.   On: 08/30/2018 09:00   Dg Chest Port 1 View  Result Date: 09/14/2018 CLINICAL DATA:  Acute respiratory failure with hypoxemia. EXAM: PORTABLE CHEST 1 VIEW COMPARISON:  Radiograph August 24, 2018. FINDINGS: Endotracheal tube is unchanged in position. Nasogastric tube tip is seen in distal esophagus. Stable cardiomegaly. No pneumothorax is noted. Right lung is clear. Stable left lung airspace opacity is noted concerning for pneumonia or atelectasis with associated pleural effusion. Bony thorax is unremarkable. IMPRESSION: Nasogastric tube tip seen in distal esophagus. Stable left lung opacity as described above. Electronically Signed   By: Marijo Conception, M.D.   On: 09-14-2018 07:40   Dg Chest Portable 1 View  Result Date: 08/19/2018 CLINICAL DATA:  Intubation EXAM: PORTABLE CHEST 1 VIEW COMPARISON:  Earlier today FINDINGS: Endotracheal tube tip between the clavicular heads and carina. An orogastric tube tip reaches the stomach with side port at the GE junction. Dense opacity in the peripheral left chest. There is chronic mediastinal distortion from a hiatal hernia. The right lung is clear. IMPRESSION: 1. New endotracheal and orogastric tubes in good position. 2. Unchanged airspace disease and possible loculated pleural effusion on the left. Electronically Signed   By: Monte Fantasia M.D.   On: 09/12/2018 19:08   Dg Hip Unilat W Or Wo Pelvis 2-3 Views Left  Result Date: 09/14/2018 CLINICAL DATA:  [facility] today and nursing home. EXAM: DG HIP (WITH OR WITHOUT PELVIS) 2-3V LEFT COMPARISON:  Coronal and sagittal reconstructed images through the pelvis and hips from an abdominal and pelvic CT scan of May 29, 2018 FINDINGS: A large amount of gas and stool degrades the images of the pelvis. No definite acute pelvic fracture is observed. AP and lateral views of the left hip reveal moderate symmetric narrowing of the joint space.  No acute fracture is  observed. The femoral neck, intertrochanteric, and subtrochanteric regions are normal. IMPRESSION: No acute fracture or dislocation of the left hip is observed. There is moderate osteoarthritic joint space loss. Visualization of the bony pelvis is limited. Electronically Signed   By: David  Martinique M.D.   On: 08/19/2018 08:47    Microbiology Recent Results (from the past 240 hour(s))  Culture, blood (Routine X 2) w Reflex to ID Panel     Status: None (Preliminary result)   Collection Time: 09/08/2018  9:21 PM  Result Value Ref Range Status   Specimen Description   Final    BLOOD LEFT ARM Performed at [facility] [address], [place], [place] [address]    Special Requests   Final    BOTTLES DRAWN AEROBIC ONLY Blood Culture results may not be optimal due to an inadequate volume of blood received in culture bottles Performed at [facility] [address], [place], [place] [address]    Culture   Final    NO GROWTH 4 DAYS Performed at [facility], [place] [address], [place], [place] [address]    Report Status PENDING  Incomplete  Culture, blood (Routine X 2) w Reflex to ID Panel     Status: None (Preliminary result)   Collection Time: 08/22/2018 10:09 PM  Result Value Ref Range Status   Specimen Description   Final    BLOOD LEFT HAND Performed at [facility] [address], [place], [place] [address]    Special Requests   Final    BOTTLES DRAWN AEROBIC ONLY Blood Culture adequate volume Performed at [facility] [address] [place], [place] [address]    Culture   Final    NO GROWTH 4 DAYS Performed at [facility], [place] [address] [place], [place] [address]    Report Status PENDING  Incomplete  Culture, respiratory (non-expectorated)     Status: None   Collection Time: 08/24/18  1:08 AM  Result Value Ref Range Status   Specimen Description   Final    ENDOTRACHEAL Performed at [facility] [address] [place], [place] [address]    Special Requests SPUTUM  Final   Gram Stain   Final    FEW WBC PRESENT, PREDOMINANTLY PMN FEW GRAM POSITIVE COCCI IN PAIRS Performed at [facility], [place] [address], [place],  [address]    Culture FEW STREPTOCOCCUS PNEUMONIAE  Final   Report Status 08/26/2018 FINAL  Final   Organism ID, Bacteria STREPTOCOCCUS PNEUMONIAE  Final      Susceptibility   Streptococcus pneumoniae - MIC*    ERYTHROMYCIN >=8 RESISTANT Resistant     LEVOFLOXACIN 0.5 SENSITIVE Sensitive     VANCOMYCIN 0.5 SENSITIVE Sensitive     PENICILLIN (meningitis) 1 RESISTANT Resistant     PENICILLIN (non-meningitis) 1 SENSITIVE Sensitive     CEFTRIAXONE (non-meningitis) 0.5 SENSITIVE Sensitive     CEFTRIAXONE (meningitis) 0.5 SENSITIVE Sensitive     * FEW STREPTOCOCCUS PNEUMONIAE  MRSA PCR Screening     Status: Abnormal   Collection Time: 08/24/18  1:24 AM  Result Value Ref Range Status   MRSA by PCR POSITIVE (A) NEGATIVE Final    Comment:        The GeneXpert MRSA Assay (FDA approved for NASAL specimens only), is one component of a comprehensive MRSA colonization surveillance program. It is not intended to diagnose MRSA infection nor  to guide or monitor treatment for MRSA infections. RESULT CALLED TO, READ BACK BY AND VERIFIED WITHLeida Lauth RN [id number] 08/24/18 A NAVARRO Performed at [facility], [place] [address], [place], [place] [address]     Lab Basic Metabolic Panel: Recent Labs  Lab 08/28/2018 0921 08/24/18 0246 04-Sep-2018 0249  NA 131* 137 144  K 3.7 3.6 3.0*  CL 92* 104 115*  CO2 26 20* 22  GLUCOSE 75 191* 144*  BUN 40* 36* 27*  CREATININE 0.54 0.65 0.41*  CALCIUM 10.2 8.6* 8.4*  MG  --  1.8  --   PHOS  --  2.6  --    Liver Function Tests: Recent Labs  Lab 08/24/18 0246  AST 94*  ALT 66*  ALKPHOS 84  BILITOT 1.3*  PROT 4.7*  ALBUMIN 2.0*   No results for input(s): LIPASE, AMYLASE  in the last 168 hours. No results for input(s): AMMONIA in the last 168 hours. CBC: Recent Labs  Lab 09/14/2018 0921 08/24/18 0246 04-Sep-2018 0249  WBC 20.9* 20.8* 20.8*  NEUTROABS 20.0*  --  19.0*  HGB 14.5 11.4* 11.2*  HCT 44.8 36.0 35.3*  MCV 94.5 93.8 93.6  PLT 233 261 221   Cardiac Enzymes: Recent Labs  Lab 09/10/2018 0921 08/24/18 0246  CKTOTAL 1,822* 294*   Sepsis Labs: Recent Labs  Lab 08/24/2018 0921 08/31/2018 2050 08/24/18 0246 04-Sep-2018 0249  PROCALCITON  --  5.46 7.48 4.86  WBC 20.9*  --  20.8* 20.8*    Procedures/Operations  Endotracheal intubation   Simonne Maffucci 08/29/2018, 3:04 PM

## 2018-09-15 NOTE — Progress Notes (Signed)
170 mL IV fentanyl wasted. Witnessed by myself and Jolee Ewing, RN.

## 2018-09-15 NOTE — Progress Notes (Signed)
Patient pronounced dead at 50 by myself and Adele Barthel, Therapist, sports. No breath sounds or heart sounds auscultated. Patient resting comfortably on fentanyl drip at time of death. Family at bedside at time of death. RN still providing assistance to family at bedside. Patient's 2 rings and 1 bracelet sent home with Emelda Fear, POA. Lake Bells, MD notified. [facility] notified, patient is not a candidate for donation.

## 2018-09-15 NOTE — Progress Notes (Addendum)
Called to pt bedside due to increased peak pressures on vent and frequent alarming.  Pt hyper-oxygenated on 100% fio2, lavaged and suctioned for moderate amount of thick yellow/tan secretions/mucus plugs.  Pt tolerated well without incident.  Peak pressures now below 30.  HR, spo2 wnl.  RT will continue to monitor pt.

## 2018-09-15 NOTE — Progress Notes (Signed)
[facility] PCCM  I met with the patient's family this afternoon (Granddaughters) with Hassan Rowan who holds [facility] POA.  They have indicated that the do not want Jeanne Cruz to suffer anymore and would like to withdraw life support.  This is very reasonable as her condition has not improved and her overall prognosis is poor.  Will write orders for Full DNR and withdrawal of care/comfort care order set.  Additional CC time meeting with family, discussing goals of care 45 minutes  Roselie Awkward, MD [facility] PCCM Pager: [phone] Cell: [phone] If no response, call [phone]

## 2018-09-15 DEATH — deceased

## 2018-12-13 IMAGING — CT CT L SPINE W/O CM
3 of 4 series · 13 of 33 positions shown, 16 images · non-contrast
Comparison: CT scan 05/29/2018

CLINICAL DATA: Fell.  Back pain.

EXAM:
CT LUMBAR SPINE WITHOUT CONTRAST
TECHNIQUE: Multidetector CT imaging of the lumbar spine was performed without
intravenous contrast administration. Multiplanar CT image
reconstructions were also generated.

[Series 4: l spine st · axial · 0.30mm/px · z∈[-508,-394]mm · 5 of 87 slices shown, 7 images]
[im 15/87  soft-tissue]
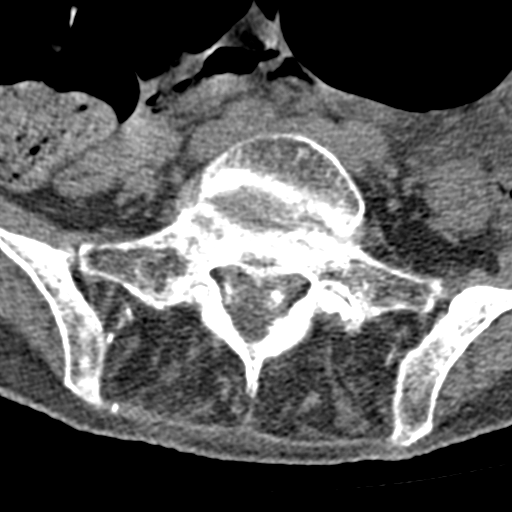
[im 15/87  bone]
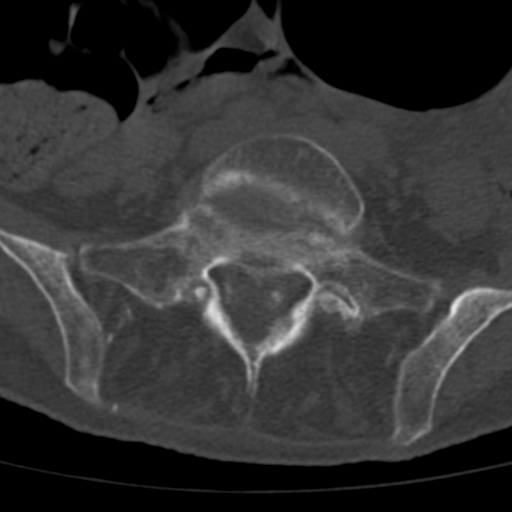
[im 29/87  bone]
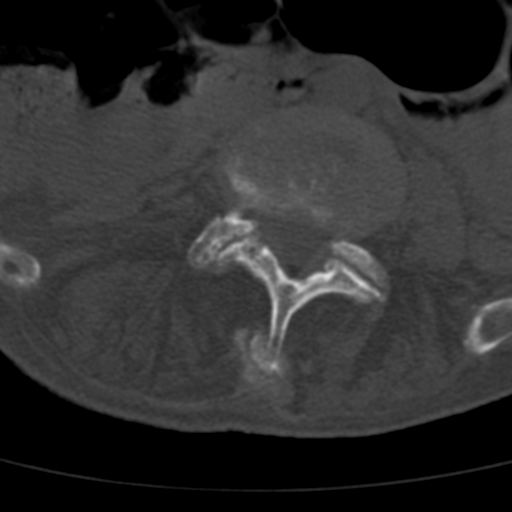
[im 44/87  bone]
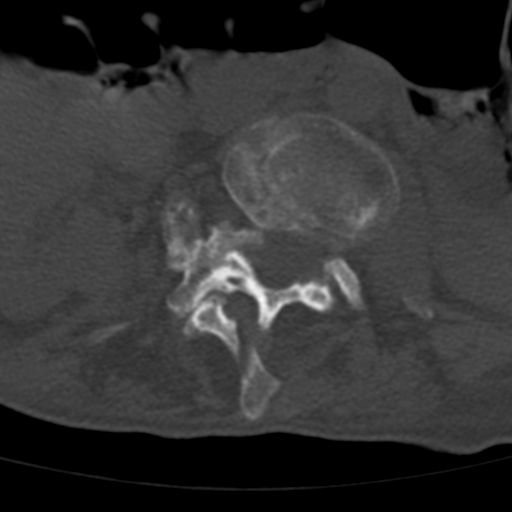
[im 58/87  bone]
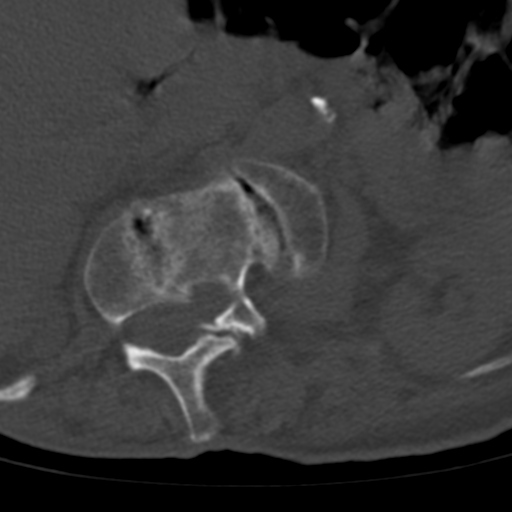
[im 72/87  soft-tissue]
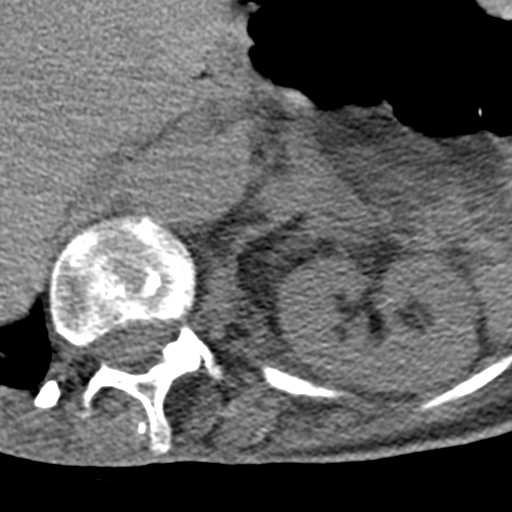
[im 72/87  bone]
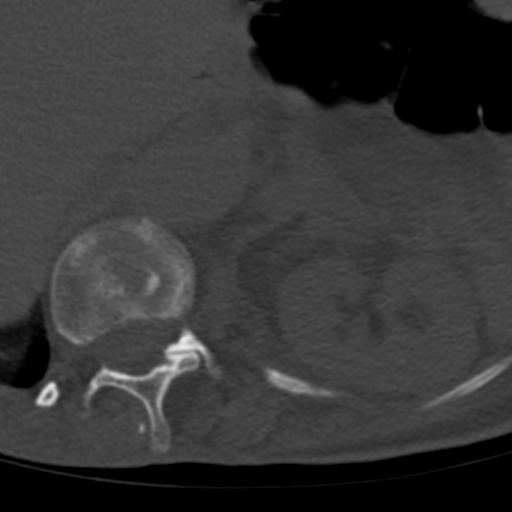

[Series 10: sagittal st · sagittal · 0.32mm/px · 5 of 65 slices shown, 6 images]
[im 22/65  bone]
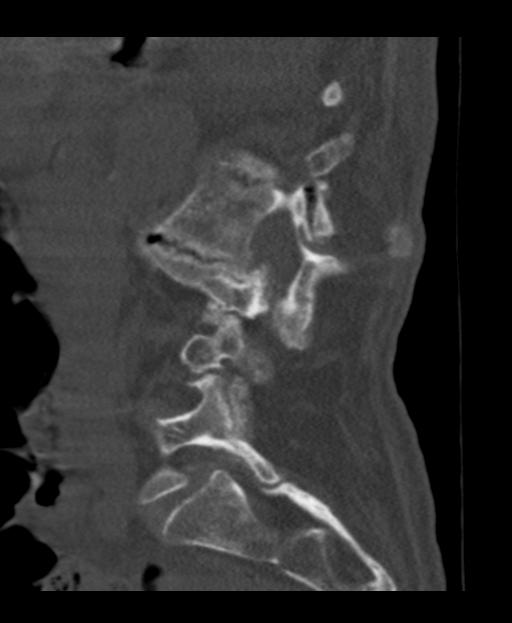
[im 27/65  bone]
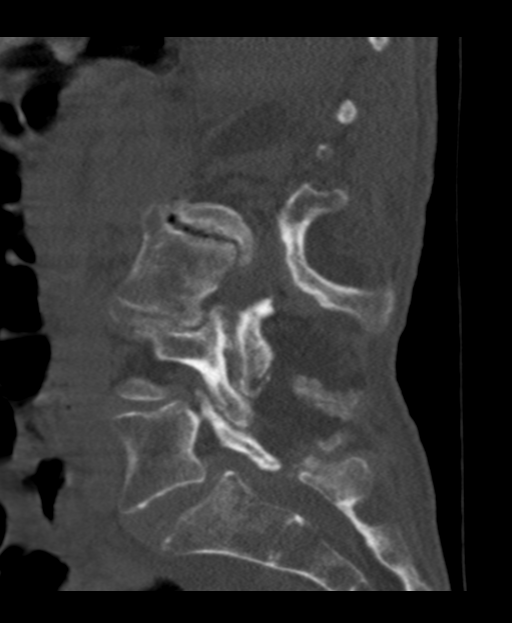
[im 33/65  soft-tissue]
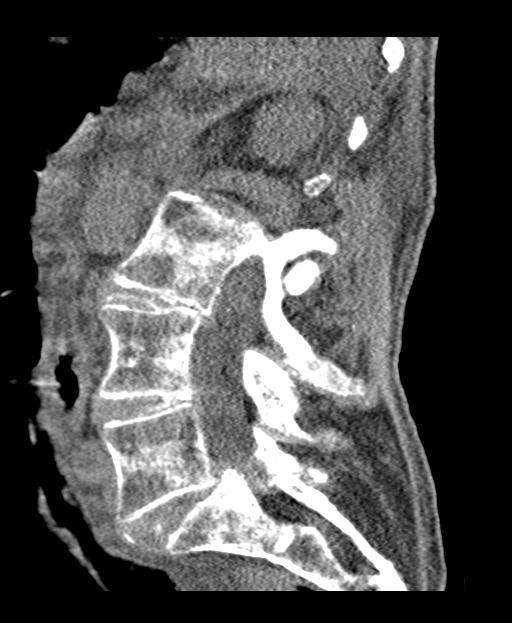
[im 33/65  bone]
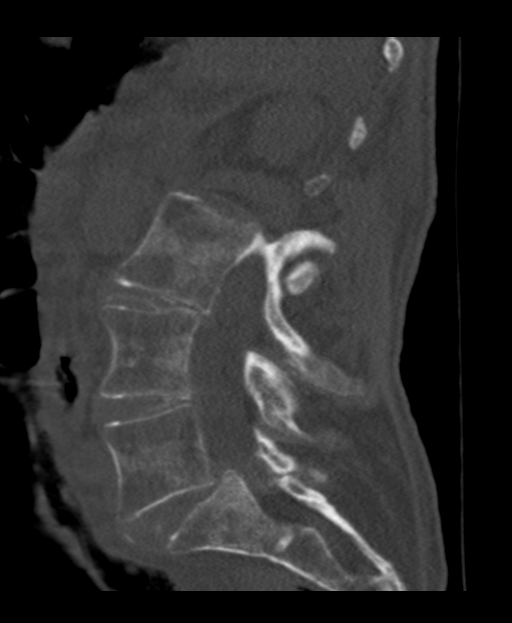
[im 38/65  bone]
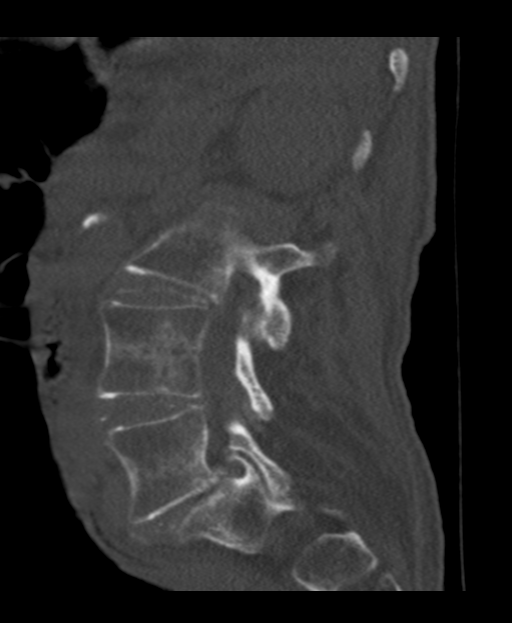
[im 43/65  bone]
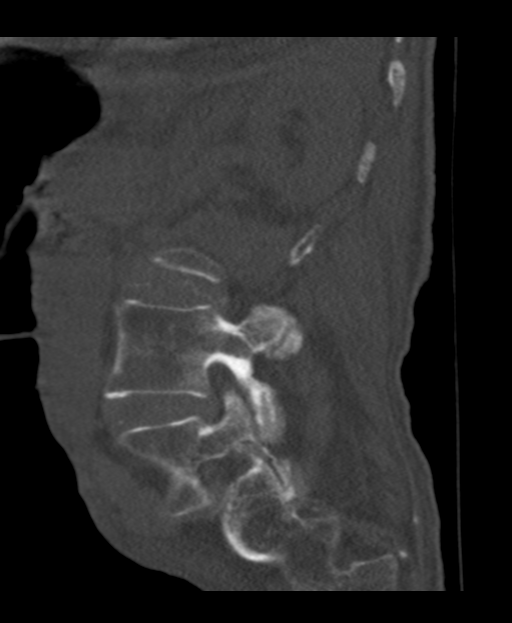

[Series 11: coronal st · coronal · 0.32mm/px · 3 of 58 slices shown]
[im 12/58  bone]
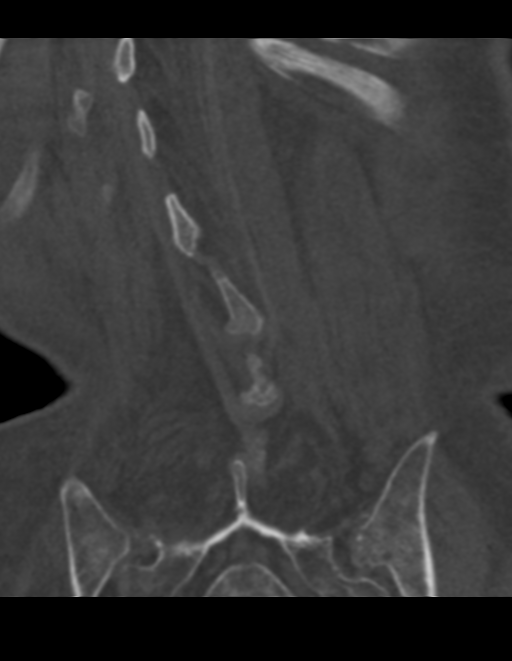
[im 23/58  bone]
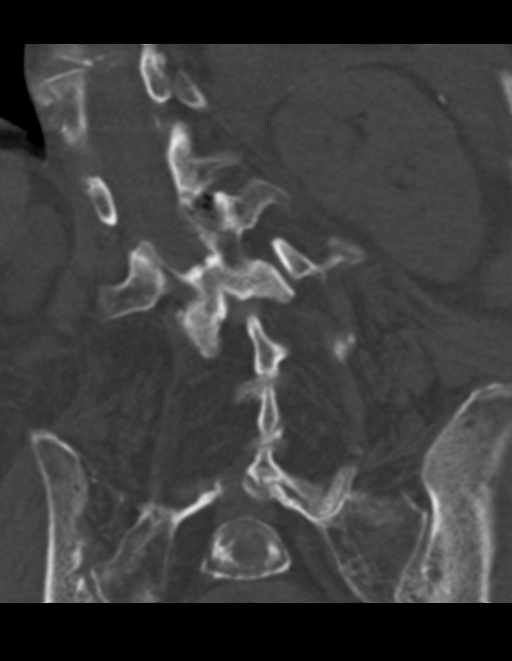
[im 35/58  bone]
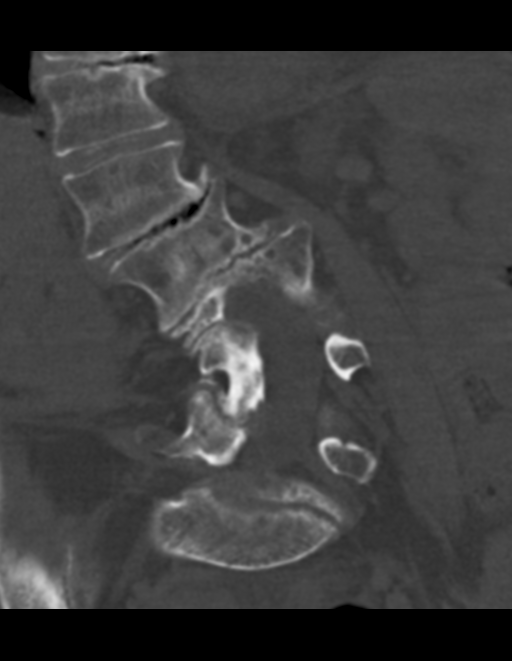

[13 of 33 positions shown; findings below may reference images not displayed]

FINDINGS: Segmentation: There are five lumbar type vertebral bodies. The last
full intervertebral disc space is labeled L5-S1.

Alignment: Severe left convex lumbar scoliosis estimated at 64
degrees. Associated severe degenerative lumbar spondylosis with
multilevel disc disease and facet disease most significant at
T11-12, L1-2 and L2-3. Advanced facet disease but no definite pars
defects.

Vertebrae: No acute fracture. Stable osteoporosis. L1 Schmorl's node
noted. The upper sacrum appears intact. The SI joints are intact.

Paraspinal and other soft tissues: No significant findings.

Disc levels: No obvious disc protrusions or spinal stenosis.
IMPRESSION: Severe scoliosis and degenerative lumbar spondylosis.

Osteoporosis.

No obvious lumbar fractures.

## 2018-12-15 IMAGING — DX DG CHEST 1V PORT
1 series · 1 of 1 positions shown · non-contrast
Comparison: Radiograph August 24, 2018.

CLINICAL DATA: Acute respiratory failure with hypoxemia.

EXAM:
PORTABLE CHEST 1 VIEW

[chest ap]
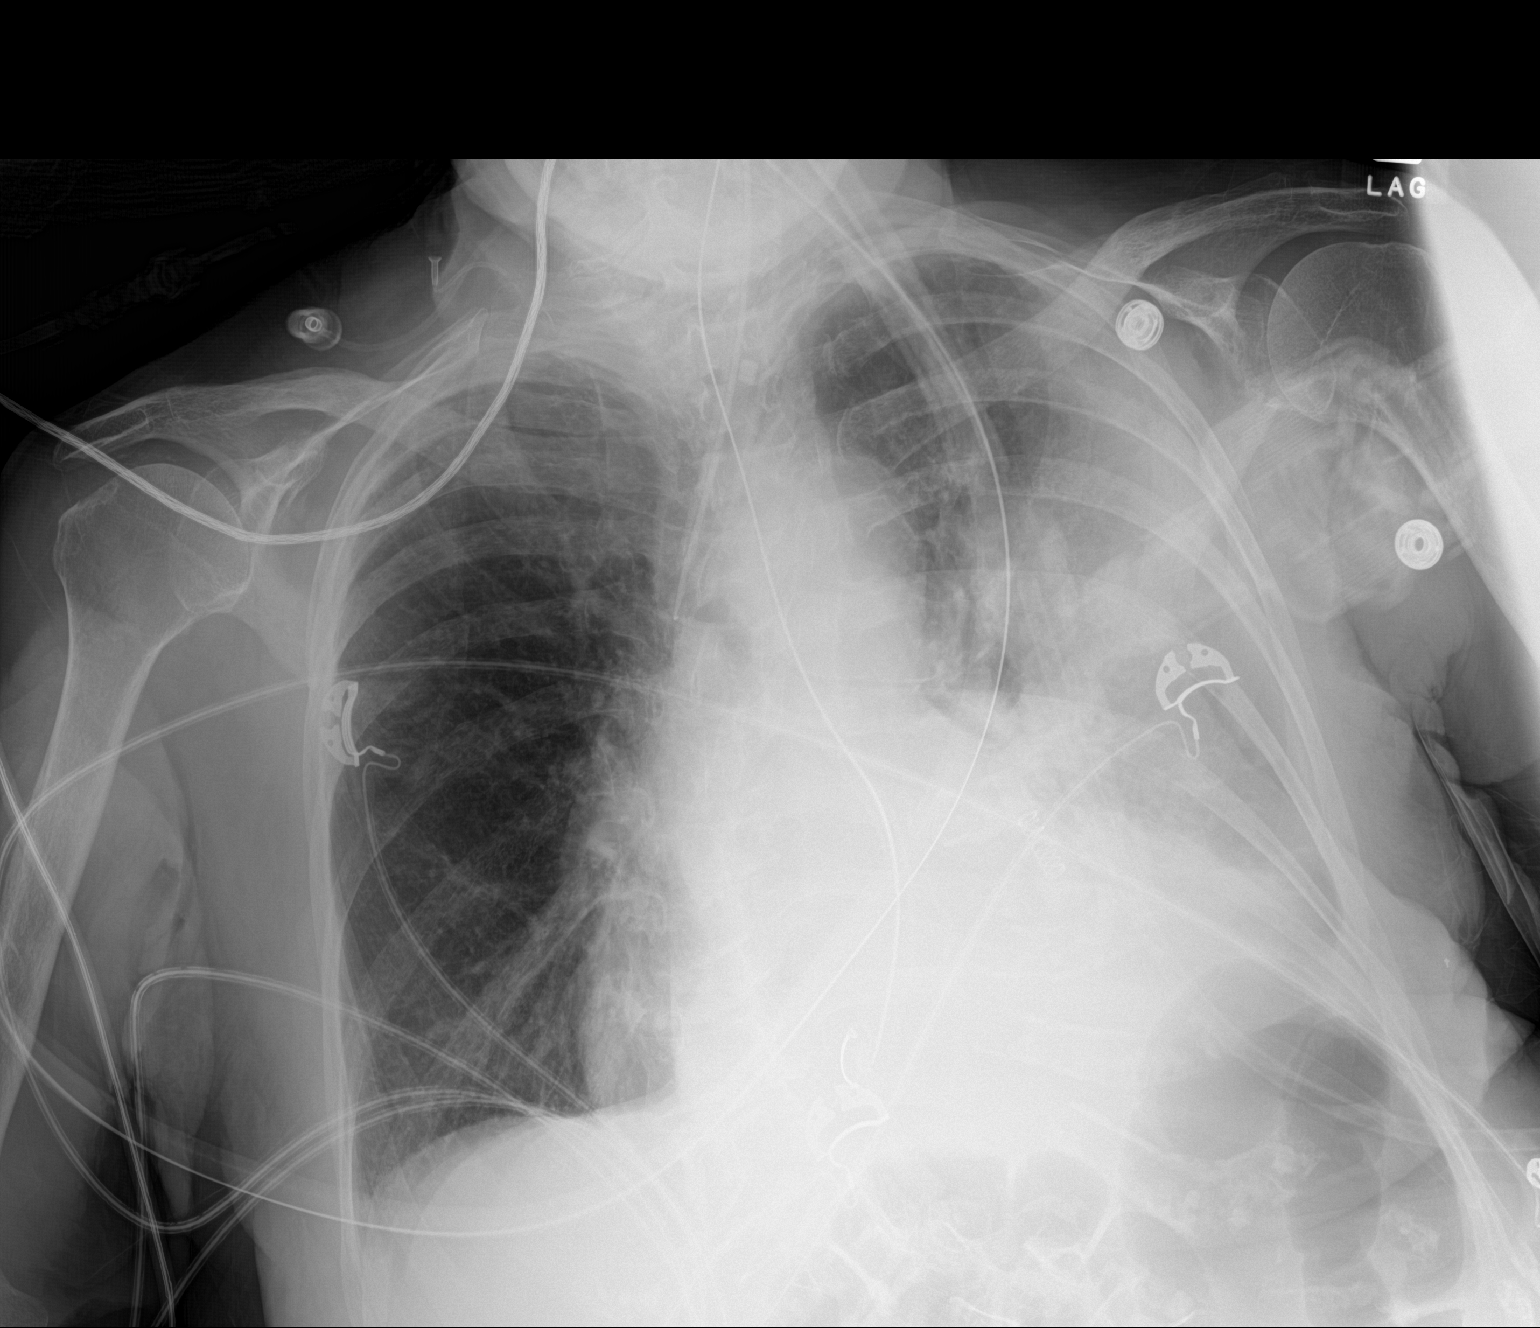

[1 of 1 positions shown; findings below may reference images not displayed]

FINDINGS: Endotracheal tube is unchanged in position. Nasogastric tube tip is
seen in distal esophagus. Stable cardiomegaly. No pneumothorax is
noted. Right lung is clear. Stable left lung airspace opacity is
noted concerning for pneumonia or atelectasis with associated
pleural effusion. Bony thorax is unremarkable.
IMPRESSION: Nasogastric tube tip seen in distal esophagus. Stable left lung
opacity as described above.
